# Patient Record
Sex: Male | Born: 1955 | Race: White | Hispanic: No | Marital: Single | Smoking: Never smoker
Health system: Southern US, Community
[De-identification: ages and names within clinical notes are randomized; demographics above are authoritative.]

## PROBLEM LIST (undated history)

## (undated) DIAGNOSIS — M1990 Unspecified osteoarthritis, unspecified site: Secondary | ICD-10-CM

## (undated) DIAGNOSIS — J45909 Unspecified asthma, uncomplicated: Secondary | ICD-10-CM

## (undated) DIAGNOSIS — R42 Dizziness and giddiness: Secondary | ICD-10-CM

## (undated) HISTORY — PX: SUBTOTAL COLECTOMY: SHX855

## (undated) HISTORY — PX: CATARACT EXTRACTION: SUR2

---

## 2018-04-10 ENCOUNTER — Emergency Department (HOSPITAL_COMMUNITY): Payer: Self-pay

## 2018-04-10 ENCOUNTER — Encounter (HOSPITAL_COMMUNITY): Payer: Self-pay | Admitting: Emergency Medicine

## 2018-04-10 ENCOUNTER — Other Ambulatory Visit: Payer: Self-pay

## 2018-04-10 ENCOUNTER — Emergency Department (HOSPITAL_COMMUNITY)
Admission: EM | Admit: 2018-04-10 | Discharge: 2018-04-10 | Disposition: A | Discharge status: Home / Self Care | Payer: Self-pay | Attending: Emergency Medicine | Admitting: Emergency Medicine

## 2018-04-10 DIAGNOSIS — J45909 Unspecified asthma, uncomplicated: Secondary | ICD-10-CM | POA: Insufficient documentation

## 2018-04-10 DIAGNOSIS — I10 Essential (primary) hypertension: Secondary | ICD-10-CM | POA: Insufficient documentation

## 2018-04-10 DIAGNOSIS — E876 Hypokalemia: Secondary | ICD-10-CM | POA: Insufficient documentation

## 2018-04-10 HISTORY — DX: Unspecified asthma, uncomplicated: J45.909

## 2018-04-10 HISTORY — DX: Dizziness and giddiness: R42

## 2018-04-10 HISTORY — DX: Unspecified osteoarthritis, unspecified site: M19.90

## 2018-04-10 LAB — BASIC METABOLIC PANEL
Anion gap: 9 (ref 5–15)
BUN: 12 mg/dL (ref 8–23)
CO2: 26 mmol/L (ref 22–32)
Calcium: 9.5 mg/dL (ref 8.9–10.3)
Chloride: 103 mmol/L (ref 98–111)
Creatinine, Ser: 1.14 mg/dL (ref 0.61–1.24)
GFR calc Af Amer: >60 mL/min (ref >60)
GFR calc non Af Amer: >60 mL/min (ref >60)
Glucose, Bld: 107 mg/dL — ABNORMAL HIGH (ref 70–99)
Potassium: 3.3 mmol/L — ABNORMAL LOW (ref 3.5–5.1)
SODIUM: 138 mmol/L (ref 135–145)

## 2018-04-10 LAB — CBC
HCT: 41.7 % (ref 39.0–52.0)
Hemoglobin: 13.6 g/dL (ref 13.0–17.0)
MCH: 33.8 pg (ref 26.0–34.0)
MCHC: 32.6 g/dL (ref 30.0–36.0)
MCV: 103.7 fL — ABNORMAL HIGH (ref 80.0–100.0)
NRBC: 0 % (ref 0.0–0.2)
Platelets: 274 10*3/uL (ref 150–400)
RBC: 4.02 MIL/uL — ABNORMAL LOW (ref 4.22–5.81)
RDW: 12.4 % (ref 11.5–15.5)
WBC: 5.2 10*3/uL (ref 4.0–10.5)

## 2018-04-10 MED ORDER — POTASSIUM CHLORIDE CRYS ER 20 MEQ PO TBCR
40.0000 meq | EXTENDED_RELEASE_TABLET | Freq: Once | ORAL | Status: AC
  Administered 2018-04-10: 40 meq via ORAL
  Filled 2018-04-10: qty 2

## 2018-04-10 MED ORDER — HYDROCHLOROTHIAZIDE 25 MG PO TABS: 25.0000 mg | ORAL_TABLET | Freq: Every day | ORAL | 1 refills | Status: DC

## 2018-04-10 NOTE — Discharge Instructions (Signed)
Start the blood pressure medicine hydrochlorothiazide take it daily.  Follow-up with your new primary care doctor for recheck of the blood pressure and for recheck of your potassium.  Given information about foods that are high in potassium bananas are 1 of them would recommend 2 bananas a day.

## 2018-04-10 NOTE — ED Provider Notes (Signed)
[facility] EMERGENCY DEPARTMENT Provider Note   CSN: [id number] Arrival date & time: 04/10/18  1047     History   Chief Complaint Chief Complaint  Patient presents with  . Hypertension    HPI Todd Richardson is a 62 y.o. male.  Patient sent from his new primary care clinic.  That he went to see for knee pain he was sent over for high blood pressure.  Blood pressure in the office was 210/101.  Patient is not on any blood pressure meds.  States that he was not aware that he had hypertension.  Patient also without any symptoms.  No shortness of breath no chest pain no headache no strokelike symptoms.  States that he has been peeing normally.     Past Medical History:  Diagnosis Date  . Arthritis   . Asthma   . Vertigo     There are no active problems to display for this patient.   History reviewed. No pertinent surgical history.      Home Medications    Prior to Admission medications   Medication Sig Start Date End Date Taking? Authorizing Provider  hydrochlorothiazide (HYDRODIURIL) 25 MG tablet Take 1 tablet (25 mg total) by mouth daily. 04/10/18   Fredia Sorrow, MD    Family History History reviewed. No pertinent family history.  Social History Social History   Tobacco Use  . Smoking status: Never Smoker  . Smokeless tobacco: Never Used  Substance Use Topics  . Alcohol use: Yes    Alcohol/week: 4.0 standard drinks    Types: 4 Cans of beer per week    Comment: every other day   . Drug use: Never     Allergies   Apple   Review of Systems Review of Systems  Constitutional: Negative for fever.  HENT: Negative for congestion and trouble swallowing.   Eyes: Negative for visual disturbance.  Respiratory: Negative for shortness of breath.   Cardiovascular: Negative for chest pain.  Gastrointestinal: Negative for abdominal pain and nausea.  Musculoskeletal: Positive for joint swelling.  Skin: Negative for rash.  Neurological: Negative for syncope,  weakness, numbness and headaches.  Hematological: Does not bruise/bleed easily.  Psychiatric/Behavioral: Negative for confusion.     Physical Exam Updated Vital Signs BP (!) 161/140   Pulse 87   Temp 98.6 F (37 C) (Oral)   Resp 16   Ht 1.702 m (5\' 7" )   Wt 92.5 kg   SpO2 (!) 87%   BMI 31.95 kg/m   Physical Exam Vitals signs and nursing note reviewed.  Constitutional:      General: He is not in acute distress.    Appearance: Normal appearance.  HENT:     Head: Normocephalic and atraumatic.     Mouth/Throat:     Mouth: Mucous membranes are moist.  Eyes:     Extraocular Movements: Extraocular movements intact.     Conjunctiva/sclera: Conjunctivae normal.     Pupils: Pupils are equal, round, and reactive to light.  Neck:     Musculoskeletal: Normal range of motion and neck supple.  Cardiovascular:     Rate and Rhythm: Normal rate and regular rhythm.     Heart sounds: No murmur.  Pulmonary:     Effort: Pulmonary effort is normal. No respiratory distress.     Breath sounds: Normal breath sounds.  Abdominal:     General: Bowel sounds are normal.     Palpations: Abdomen is soft.     Tenderness: There is no abdominal tenderness.  Musculoskeletal: Normal range of motion.        General: No swelling or deformity.     Comments: Lateral knees without any significant abnormalities.  Skin:    General: Skin is warm.     Capillary Refill: Capillary refill takes less than 2 seconds.  Neurological:     General: No focal deficit present.     Mental Status: He is alert and oriented to person, place, and time.      ED Treatments / Results  Labs (all labs ordered are listed, but only abnormal results are displayed) Labs Reviewed  CBC - Abnormal; Notable for the following components:      Result Value   RBC 4.02 (*)    MCV 103.7 (*)    All other components within normal limits  BASIC METABOLIC PANEL - Abnormal; Notable for the following components:   Potassium 3.3 (*)     Glucose, Bld 107 (*)    All other components within normal limits    EKG EKG Interpretation  Date/Time:  Friday April 10 2018 11:05:04 EST Ventricular Rate:  93 PR Interval:    QRS Duration: 95 QT Interval:  393 QTC Calculation: 489 R Axis:   11 Text Interpretation:  Sinus rhythm Left ventricular hypertrophy Nonspecific T abnrm, anterolateral leads Borderline prolonged QT interval Baseline wander in lead(s) V2 No previous ECGs available Artifact Confirmed by Fredia Sorrow [phone]) on 04/10/2018 11:14:53 AM   Radiology Dg Chest 2 View  Result Date: 04/10/2018 CLINICAL DATA:  Hypertension EXAM: CHEST - 2 VIEW COMPARISON:  None. FINDINGS: The heart size and mediastinal contours are within normal limits. Both lungs are clear. The visualized skeletal structures are unremarkable. IMPRESSION: No active cardiopulmonary disease. Electronically Signed   By: Franchot Gallo M.D.   On: 04/10/2018 13:04    Procedures Procedures (including critical care time)  Medications Ordered in ED Medications  potassium chloride SA (K-DUR,KLOR-CON) CR tablet 40 mEq (40 mEq Oral Given 04/10/18 1323)     Initial Impression / Assessment and Plan / ED Course  I have reviewed the triage vital signs and the nursing notes.  Pertinent labs & imaging results that were available during my care of the patient were reviewed by me and considered in my medical decision making (see chart for details).    Patient referred for evaluation of hypertension.  This would be new onset as far as treatment goes.  Patient had chest x-ray basic labs.  No significant abnormalities other than potassium was slightly low at 3.3.  Patient given 40 mEq of potassium orally here.  Given diet information for foods high in potassium.  Will start him on hydrochlorothiazide since he is over age 59.  Patient will follow back up with his new primary care provider for follow-ups of the blood pressure and for following his potassium.   Patient nontoxic no acute distress.   Final Clinical Impressions(s) / ED Diagnoses   Final diagnoses:  Essential hypertension  Hypokalemia    ED Discharge Orders         Ordered    hydrochlorothiazide (HYDRODIURIL) 25 MG tablet  Daily     04/10/18 1321           Fredia Sorrow, MD 04/10/18 1628

## 2018-04-10 NOTE — ED Notes (Signed)
edp aware of bp  

## 2018-04-10 NOTE — ED Triage Notes (Signed)
Pt was seen at [facility] for knee pain but sent over to ED for high BP.  BP in office was 210/101.  Denies being on BP meds at this moment

## 2018-04-10 NOTE — ED Notes (Signed)
NP at [facility] clinic says he thought he heard a r carotid bruit on assessment.

## 2018-07-27 ENCOUNTER — Emergency Department (HOSPITAL_COMMUNITY)
Admission: EM | Admit: 2018-07-27 | Discharge: 2018-07-27 | Disposition: A | Discharge status: Home / Self Care | Payer: Self-pay | Attending: Emergency Medicine | Admitting: Emergency Medicine

## 2018-07-27 ENCOUNTER — Encounter (HOSPITAL_COMMUNITY): Payer: Self-pay | Admitting: Emergency Medicine

## 2018-07-27 ENCOUNTER — Other Ambulatory Visit: Payer: Self-pay

## 2018-07-27 DIAGNOSIS — Z79899 Other long term (current) drug therapy: Secondary | ICD-10-CM | POA: Insufficient documentation

## 2018-07-27 DIAGNOSIS — H3561 Retinal hemorrhage, right eye: Secondary | ICD-10-CM | POA: Insufficient documentation

## 2018-07-27 DIAGNOSIS — J45909 Unspecified asthma, uncomplicated: Secondary | ICD-10-CM | POA: Insufficient documentation

## 2018-07-27 NOTE — ED Provider Notes (Signed)
[facility] EMERGENCY DEPARTMENT Provider Note   CSN: [id number] Arrival date & time: 07/27/18  1102    History   Chief Complaint Chief Complaint  Patient presents with  . Eye Problem    HPI Todd Richardson is a 63 y.o. male.     HPI   He reports onset of spots in his right eye visual field, this morning.  He also feels like there is a dark ribbonlike spot moving across his visual field.  Has a history of similar problems with his left eye, and has very blurry vision, for 3 months in the left eye.  He is being treated for high blood pressure.  This morning at home it was 130/95.  He is taking HCTZ and lisinopril.  He has not seen a ophthalmologist regarding vision loss left eye, yet.  Past Medical History:  Diagnosis Date  . Arthritis   . Asthma   . Vertigo     There are no active problems to display for this patient.   History reviewed. No pertinent surgical history.      Home Medications    Prior to Admission medications   Medication Sig Start Date End Date Taking? Authorizing Provider  hydrochlorothiazide (HYDRODIURIL) 25 MG tablet Take 1 tablet (25 mg total) by mouth daily. 04/10/18   Fredia Sorrow, MD    Family History Family History  Problem Relation Age of Onset  . Hypertension Mother   . Diabetes Mother   . Parkinson's disease Father   . Dementia Father     Social History Social History   Tobacco Use  . Smoking status: Never Smoker  . Smokeless tobacco: Never Used  Substance Use Topics  . Alcohol use: Yes    Alcohol/week: 4.0 standard drinks    Types: 4 Cans of beer per week    Comment: every day  . Drug use: Never     Allergies   Apple   Review of Systems Review of Systems  All other systems reviewed and are negative.    Physical Exam Updated Vital Signs BP (!) 167/102 (BP Location: Left Arm)   Pulse (!) 102   Temp 98.3 F (36.8 C) (Oral)   Resp 16   Ht 5\' 7"  (1.702 m)   Wt 88.5 kg   SpO2 100%   BMI 30.54 kg/m    Physical Exam Vitals signs and nursing note reviewed.  Constitutional:      Appearance: Normal appearance. He is well-developed.  HENT:     Head: Normocephalic and atraumatic.     Right Ear: External ear normal.     Left Ear: External ear normal.  Eyes:     General:        Right eye: No discharge.        Left eye: No discharge.     Conjunctiva/sclera: Conjunctivae normal.     Pupils: Pupils are equal, round, and reactive to light.     Comments: Fundndoscopy: Right eye, apparent recent retinal hemorrhage, 2 to 3 o'clock position.  Left eye-retinal distortion, medial hemisphere, appears chronic.  Neck:     Musculoskeletal: Normal range of motion and neck supple.     Trachea: Phonation normal.  Cardiovascular:     Rate and Rhythm: Normal rate and regular rhythm.  Pulmonary:     Effort: Pulmonary effort is normal.     Breath sounds: Normal breath sounds.  Abdominal:     Palpations: Abdomen is soft.  Musculoskeletal: Normal range of motion.  Skin:  General: Skin is warm and dry.  Neurological:     Mental Status: He is alert and oriented to person, place, and time.     Cranial Nerves: No cranial nerve deficit.     Sensory: No sensory deficit.     Motor: No abnormal muscle tone.     Coordination: Coordination normal.  Psychiatric:        Mood and Affect: Mood normal.        Behavior: Behavior normal.        Thought Content: Thought content normal.        Judgment: Judgment normal.      ED Treatments / Results  Labs (all labs ordered are listed, but only abnormal results are displayed) Labs Reviewed - No data to display  EKG None  Radiology No results found.  Procedures Procedures (including critical care time)  Medications Ordered in ED Medications - No data to display   Initial Impression / Assessment and Plan / ED Course  I have reviewed the triage vital signs and the nursing notes.  Pertinent labs & imaging results that were available during my care of  the patient were reviewed by me and considered in my medical decision making (see chart for details).  Clinical Course as of Jul 27 1343  Mon Jul 27, 2018  1333 I discussed the case with ophthalmologist, Dr. Nancy Fetter.  She will see the patient tomorrow morning at 10:30 AM in her office in [facility].  Patient is agreeable to this plan.   [EW]    Clinical Course User Index [EW] Daleen Bo, MD        Patient Vitals for the past 24 hrs:  BP Temp Temp src Pulse Resp SpO2 Height Weight  07/27/18 1116 (!) 167/102 98.3 F (36.8 C) Oral (!) 102 16 100 % - -  07/27/18 1112 - - - - - - 5\' 7"  (1.702 m) 88.5 kg    1:34 PM Reevaluation with update and discussion. After initial assessment and treatment, an updated evaluation reveals findings discussed and questions answered. Daleen Bo   Medical Decision Making: Evaluation consistent with retinal hemorrhage, right eye.  Specht older retinal hemorrhage, left eye.  Patient with mild vision loss, right eye, severe vision loss left eye.  Left eye vision loss is subacute.  Right eye vision loss is likely from today.  Patient has treated hypertension, mildly elevated this time.  Patient stable for discharge with outpatient management.  CRITICAL CARE-no Performed by: Daleen Bo  Nursing Notes Reviewed/ Care Coordinated Applicable Imaging Reviewed Interpretation of Laboratory Data incorporated into ED treatment  The patient appears reasonably screened and/or stabilized for discharge and I doubt any other medical condition or other [facility] requiring further screening, evaluation, or treatment in the ED at this time prior to discharge.  Plan: Home Medications-continue usual; Home Treatments-low-salt diet, avoid stress; return here if the recommended treatment, does not improve the symptoms; Recommended follow up-hemology at 10:30 AM tomorrow morning.   Final Clinical Impressions(s) / ED Diagnoses   Final diagnoses:  Retinal  hemorrhage, right    ED Discharge Orders    None       Daleen Bo, MD 07/27/18 1345

## 2018-07-27 NOTE — ED Triage Notes (Signed)
Patient report sudden onset of dark spots and streaks in his eyes around 0800 today. Patient states he has had blurred vision in his L eye for some time but now has problems with both eyes. No known injury. Patient denies pain.

## 2018-07-27 NOTE — Discharge Instructions (Addendum)
Follow-up with Dr. Nancy Fetter tomorrow, in her office, in [place], tomorrow morning at 10:30 AM.  It is very important to go to that office, as scheduled, to prevent further vision loss.  In the meantime rest and avoid exertion or stress.  Continue to take your blood pressure medicine.  Also to help your blood pressure stay on a low-salt diet.  Do not take aspirin, or ibuprofen.  These can worsen bleeding.

## 2021-05-14 ENCOUNTER — Encounter: Payer: Self-pay | Admitting: Internal Medicine

## 2021-07-04 ENCOUNTER — Ambulatory Visit (INDEPENDENT_AMBULATORY_CARE_PROVIDER_SITE_OTHER): Payer: Self-pay | Admitting: *Deleted

## 2021-07-04 ENCOUNTER — Other Ambulatory Visit: Payer: Self-pay

## 2021-07-04 VITALS — Ht 68.0 in | Wt 188.0 lb

## 2021-07-04 DIAGNOSIS — Z1211 Encounter for screening for malignant neoplasm of colon: Secondary | ICD-10-CM

## 2021-07-04 NOTE — Progress Notes (Addendum)
Gastroenterology Pre-Procedure Review  Request Date: 07/04/2021 Requesting Physician: Dr. Dahlia Client, no previous TCS  PATIENT REVIEW QUESTIONS: The patient responded to the following health history questions as indicated:    1. Diabetes Melitis: no 2. Joint replacements in the past 12 months: no 3. Major health problems in the past 3 months: yes, sick in Dec 2022 but negative for Covid, lost taste 4. Has an artificial valve or MVP: no 5. Has a defibrillator: no 6. Has been advised in past to take antibiotics in advance of a procedure like teeth cleaning: no 7. Family history of colon cancer: no 8. Alcohol Use: no 9. Illicit drug Use: no 10. History of sleep apnea: no 11. History of coronary artery or other vascular stents placed within the last 12 months: no 12. History of any prior anesthesia complications: no 13. Body mass index is 28.59 kg/m.    MEDICATIONS & ALLERGIES:    Patient reports the following regarding taking any blood thinners:   Plavix? no Aspirin? Yes, 325 mg as needed Coumadin? no Brilinta? no Xarelto? no Eliquis? no Pradaxa? no Savaysa? no Effient? no  Patient confirms/reports the following medications:  Current Outpatient Medications  Medication Sig Dispense Refill   aspirin 325 MG tablet Take 325 mg by mouth as needed.     cetirizine (ZYRTEC) 10 MG tablet Take 10 mg by mouth as needed for allergies.     lisinopril-hydrochlorothiazide (ZESTORETIC) 10-12.5 MG tablet Take 1 tablet by mouth daily.     No current facility-administered medications for this visit.    Patient confirms/reports the following allergies:  Allergies  Allergen Reactions   Apple Juice Anaphylaxis    Throat swelling     No orders of the defined types were placed in this encounter.   AUTHORIZATION INFORMATION Primary Insurance:[employer] Medicare,  ID #: [id number] ,  Group #: [id number] Pre-Cert / Auth required: Yes, approved online 11/04/2954-05/30/3084 Pre-Cert / Berkley Harvey #:  [id number]  SCHEDULE INFORMATION: Procedure has been scheduled as follows:  Date: 07/31/2021, Time: 11:00 Location: [facility] with Dr. Marletta Lor  This Gastroenterology Pre-Precedure Review Form is being routed to the following provider(s): Tana Coast, PA-C

## 2021-07-05 NOTE — Progress Notes (Signed)
Ok to schedule. ? ?MAKE SURE WE DO NOT TELL HIM TO DRINK APPLE JUICE WITH CLEARS. HE HAS SEVERE ALLERGY TO IT. ? ?Will need labs due to diuretic. ? ?ASA 2, propofol. ?

## 2021-07-06 NOTE — Progress Notes (Addendum)
Tried to call pt.  Voice mail full. ?

## 2021-07-09 NOTE — Progress Notes (Signed)
Spoke to pt.  He requested me to call him once April schedules are available. ?

## 2021-07-13 ENCOUNTER — Encounter: Payer: Self-pay | Admitting: *Deleted

## 2021-07-13 ENCOUNTER — Other Ambulatory Visit: Payer: Self-pay | Admitting: *Deleted

## 2021-07-13 DIAGNOSIS — Z1211 Encounter for screening for malignant neoplasm of colon: Secondary | ICD-10-CM

## 2021-07-13 MED ORDER — PEG 3350-KCL-NA BICARB-NACL 420 G PO SOLR: 4000.0000 mL | Freq: Once | ORAL | 0 refills | Status: AC

## 2021-07-13 NOTE — Addendum Note (Signed)
Addended by: Metro Kung on: 07/13/2021 10:41 AM ? ? Modules accepted: Orders ? ?

## 2021-07-13 NOTE — Progress Notes (Signed)
Spoke to pt.  Scheduled procedure for 07/31/2021 at 11:00, arrival at 9:30 at [facility].  Reviewed prep instructions with pt by phone.  Pt informed to have labs drawn on 07/27/2021 at [facility].  Pt advised not to drink any apple juice while on clear liquids.  Pt informed that I am mailing him prep instructions.  Confirmed mailing address.  Pt aware to pick up prep kit and required OTC items.  ?

## 2021-07-13 NOTE — Progress Notes (Signed)
Spoke to pt.  Advised pt not to take ASA 325 mg 5 days prior to procedure.  He informed me that he had already stopped taking it. ?

## 2021-07-27 ENCOUNTER — Other Ambulatory Visit (HOSPITAL_COMMUNITY)
Admission: RE | Admit: 2021-07-27 | Discharge: 2021-07-27 | Disposition: A | Discharge status: Home / Self Care | Payer: Self-pay | Source: Ambulatory Visit | Attending: Internal Medicine | Admitting: Internal Medicine

## 2021-07-27 DIAGNOSIS — Z1211 Encounter for screening for malignant neoplasm of colon: Secondary | ICD-10-CM | POA: Insufficient documentation

## 2021-07-27 LAB — BASIC METABOLIC PANEL
Anion gap: 7 (ref 5–15)
BUN: 12 mg/dL (ref 8–23)
CO2: 25 mmol/L (ref 22–32)
Calcium: 8 mg/dL — ABNORMAL LOW (ref 8.9–10.3)
Chloride: 102 mmol/L (ref 98–111)
Creatinine, Ser: 0.88 mg/dL (ref 0.61–1.24)
GFR, Estimated: >60 mL/min (ref >60)
Glucose, Bld: 116 mg/dL — ABNORMAL HIGH (ref 70–99)
Potassium: 3 mmol/L — ABNORMAL LOW (ref 3.5–5.1)
Sodium: 134 mmol/L — ABNORMAL LOW (ref 135–145)

## 2021-07-30 ENCOUNTER — Telehealth: Payer: Self-pay | Admitting: Internal Medicine

## 2021-07-30 ENCOUNTER — Other Ambulatory Visit: Payer: Self-pay | Admitting: *Deleted

## 2021-07-30 DIAGNOSIS — D500 Iron deficiency anemia secondary to blood loss (chronic): Secondary | ICD-10-CM | POA: Insufficient documentation

## 2021-07-30 MED ORDER — POTASSIUM CHLORIDE 20 MEQ/15ML (10%) PO SOLN: 40.0000 meq | Freq: Three times a day (TID) | ORAL | 0 refills | Status: DC

## 2021-07-30 NOTE — Telephone Encounter (Signed)
Potassium low at 3.0.  We will send in potassium chloride 40 meq 3 times daily for today in liquid form as requested.  Please let patient know.  Thank you ?

## 2021-07-30 NOTE — Progress Notes (Signed)
Patient's potassium 3.0. Dr. Abbey Chatters notified. Will call in prescription for liquid potassium due to patient having trouble swallowing large pills. Patient notified of the above and verbalized understanding.  ?

## 2021-07-31 ENCOUNTER — Encounter (HOSPITAL_COMMUNITY): Payer: Self-pay

## 2021-07-31 ENCOUNTER — Ambulatory Visit (HOSPITAL_COMMUNITY): Payer: Medicare Other

## 2021-07-31 ENCOUNTER — Other Ambulatory Visit: Payer: Self-pay

## 2021-07-31 ENCOUNTER — Ambulatory Visit (HOSPITAL_COMMUNITY): Payer: Medicare Other | Admitting: Anesthesiology

## 2021-07-31 ENCOUNTER — Inpatient Hospital Stay (HOSPITAL_COMMUNITY)
Admission: AD | Admit: 2021-07-31 | Discharge: 2021-08-09 | DRG: 330 | Disposition: A | Discharge status: Home Health | Payer: Medicare Other | Attending: Family Medicine | Admitting: Family Medicine

## 2021-07-31 ENCOUNTER — Inpatient Hospital Stay (HOSPITAL_COMMUNITY): Payer: Medicare Other

## 2021-07-31 ENCOUNTER — Encounter (HOSPITAL_COMMUNITY)
Admission: AD | Disposition: A | Discharge status: Home Health | Payer: Self-pay | Source: Home / Self Care | Attending: Family Medicine

## 2021-07-31 DIAGNOSIS — R110 Nausea: Secondary | ICD-10-CM

## 2021-07-31 DIAGNOSIS — D630 Anemia in neoplastic disease: Secondary | ICD-10-CM | POA: Diagnosis present

## 2021-07-31 DIAGNOSIS — C187 Malignant neoplasm of sigmoid colon: Secondary | ICD-10-CM | POA: Diagnosis present

## 2021-07-31 DIAGNOSIS — I4891 Unspecified atrial fibrillation: Secondary | ICD-10-CM | POA: Diagnosis not present

## 2021-07-31 DIAGNOSIS — Z8249 Family history of ischemic heart disease and other diseases of the circulatory system: Secondary | ICD-10-CM | POA: Diagnosis not present

## 2021-07-31 DIAGNOSIS — K648 Other hemorrhoids: Secondary | ICD-10-CM

## 2021-07-31 DIAGNOSIS — Z7982 Long term (current) use of aspirin: Secondary | ICD-10-CM

## 2021-07-31 DIAGNOSIS — M1990 Unspecified osteoarthritis, unspecified site: Secondary | ICD-10-CM | POA: Diagnosis present

## 2021-07-31 DIAGNOSIS — J302 Other seasonal allergic rhinitis: Secondary | ICD-10-CM | POA: Diagnosis present

## 2021-07-31 DIAGNOSIS — R739 Hyperglycemia, unspecified: Secondary | ICD-10-CM | POA: Diagnosis present

## 2021-07-31 DIAGNOSIS — C19 Malignant neoplasm of rectosigmoid junction: Secondary | ICD-10-CM | POA: Insufficient documentation

## 2021-07-31 DIAGNOSIS — D72829 Elevated white blood cell count, unspecified: Secondary | ICD-10-CM | POA: Diagnosis present

## 2021-07-31 DIAGNOSIS — R634 Abnormal weight loss: Secondary | ICD-10-CM | POA: Diagnosis present

## 2021-07-31 DIAGNOSIS — R109 Unspecified abdominal pain: Secondary | ICD-10-CM | POA: Insufficient documentation

## 2021-07-31 DIAGNOSIS — K56691 Other complete intestinal obstruction: Secondary | ICD-10-CM | POA: Diagnosis present

## 2021-07-31 DIAGNOSIS — Z28310 Unvaccinated for covid-19: Secondary | ICD-10-CM | POA: Diagnosis not present

## 2021-07-31 DIAGNOSIS — K56609 Unspecified intestinal obstruction, unspecified as to partial versus complete obstruction: Secondary | ICD-10-CM | POA: Diagnosis not present

## 2021-07-31 DIAGNOSIS — R339 Retention of urine, unspecified: Secondary | ICD-10-CM | POA: Diagnosis not present

## 2021-07-31 DIAGNOSIS — D75839 Thrombocytosis, unspecified: Secondary | ICD-10-CM | POA: Diagnosis present

## 2021-07-31 DIAGNOSIS — Z1211 Encounter for screening for malignant neoplasm of colon: Secondary | ICD-10-CM | POA: Diagnosis not present

## 2021-07-31 DIAGNOSIS — Z91018 Allergy to other foods: Secondary | ICD-10-CM

## 2021-07-31 DIAGNOSIS — I10 Essential (primary) hypertension: Secondary | ICD-10-CM | POA: Diagnosis present

## 2021-07-31 DIAGNOSIS — Z800 Family history of malignant neoplasm of digestive organs: Secondary | ICD-10-CM

## 2021-07-31 DIAGNOSIS — C180 Malignant neoplasm of cecum: Secondary | ICD-10-CM | POA: Diagnosis present

## 2021-07-31 DIAGNOSIS — C189 Malignant neoplasm of colon, unspecified: Secondary | ICD-10-CM | POA: Diagnosis not present

## 2021-07-31 DIAGNOSIS — Z79899 Other long term (current) drug therapy: Secondary | ICD-10-CM | POA: Diagnosis not present

## 2021-07-31 DIAGNOSIS — E860 Dehydration: Secondary | ICD-10-CM | POA: Diagnosis present

## 2021-07-31 DIAGNOSIS — R9431 Abnormal electrocardiogram [ECG] [EKG]: Secondary | ICD-10-CM | POA: Diagnosis not present

## 2021-07-31 DIAGNOSIS — K224 Dyskinesia of esophagus: Secondary | ICD-10-CM | POA: Diagnosis not present

## 2021-07-31 DIAGNOSIS — I4821 Permanent atrial fibrillation: Principal | ICD-10-CM

## 2021-07-31 DIAGNOSIS — E871 Hypo-osmolality and hyponatremia: Secondary | ICD-10-CM | POA: Diagnosis present

## 2021-07-31 DIAGNOSIS — R338 Other retention of urine: Secondary | ICD-10-CM

## 2021-07-31 DIAGNOSIS — E876 Hypokalemia: Secondary | ICD-10-CM | POA: Diagnosis present

## 2021-07-31 HISTORY — PX: FLEXIBLE SIGMOIDOSCOPY: SHX5431

## 2021-07-31 HISTORY — PX: SUBMUCOSAL TATTOO INJECTION: SHX6856

## 2021-07-31 HISTORY — PX: BIOPSY: SHX5522

## 2021-07-31 LAB — COMPREHENSIVE METABOLIC PANEL
ALT: 15 U/L (ref 0–44)
AST: 20 U/L (ref 15–41)
Albumin: 2.3 g/dL — ABNORMAL LOW (ref 3.5–5.0)
Alkaline Phosphatase: 83 U/L (ref 38–126)
Anion gap: 6 (ref 5–15)
BUN: 13 mg/dL (ref 8–23)
CO2: 23 mmol/L (ref 22–32)
Calcium: 8.2 mg/dL — ABNORMAL LOW (ref 8.9–10.3)
Chloride: 104 mmol/L (ref 98–111)
Creatinine, Ser: 0.73 mg/dL (ref 0.61–1.24)
GFR, Estimated: >60 mL/min (ref >60)
Glucose, Bld: 151 mg/dL — ABNORMAL HIGH (ref 70–99)
Potassium: 3.1 mmol/L — ABNORMAL LOW (ref 3.5–5.1)
Sodium: 133 mmol/L — ABNORMAL LOW (ref 135–145)
Total Bilirubin: 0.5 mg/dL (ref 0.3–1.2)
Total Protein: 5.5 g/dL — ABNORMAL LOW (ref 6.5–8.1)

## 2021-07-31 LAB — CBC
HCT: 30.4 % — ABNORMAL LOW (ref 39.0–52.0)
Hemoglobin: 9.8 g/dL — ABNORMAL LOW (ref 13.0–17.0)
MCH: 29.6 pg (ref 26.0–34.0)
MCHC: 32.2 g/dL (ref 30.0–36.0)
MCV: 91.8 fL (ref 80.0–100.0)
Platelets: 799 10*3/uL — ABNORMAL HIGH (ref 150–400)
RBC: 3.31 MIL/uL — ABNORMAL LOW (ref 4.22–5.81)
RDW: 16.2 % — ABNORMAL HIGH (ref 11.5–15.5)
WBC: 14.6 10*3/uL — ABNORMAL HIGH (ref 4.0–10.5)
nRBC: 0 % (ref 0.0–0.2)

## 2021-07-31 LAB — MAGNESIUM: Magnesium: 2.3 mg/dL (ref 1.7–2.4)

## 2021-07-31 LAB — HIV ANTIBODY (ROUTINE TESTING W REFLEX): HIV Screen 4th Generation wRfx: NONREACTIVE

## 2021-07-31 LAB — PHOSPHORUS: Phosphorus: 3.8 mg/dL (ref 2.5–4.6)

## 2021-07-31 SURGERY — BIOPSY
Anesthesia: General

## 2021-07-31 MED ORDER — ACETAMINOPHEN 325 MG PO TABS
650.0000 mg | ORAL_TABLET | Freq: Four times a day (QID) | ORAL | Status: DC | PRN
  Filled 2021-07-31: qty 2

## 2021-07-31 MED ORDER — FENTANYL CITRATE PF 50 MCG/ML IJ SOSY
PREFILLED_SYRINGE | INTRAMUSCULAR | Status: AC
  Administered 2021-07-31: 50 ug
  Filled 2021-07-31: qty 1

## 2021-07-31 MED ORDER — ONDANSETRON HCL 4 MG/2ML IJ SOLN
4.0000 mg | Freq: Four times a day (QID) | INTRAMUSCULAR | Status: DC | PRN
Start: 2021-07-31 — End: 2021-08-09
  Administered 2021-08-01 – 2021-08-09 (×4): 4 mg via INTRAVENOUS
  Filled 2021-07-31 (×8): qty 2

## 2021-07-31 MED ORDER — IOHEXOL 300 MG/ML  SOLN
100.0000 mL | Freq: Once | INTRAMUSCULAR | Status: AC | PRN
  Administered 2021-07-31: 100 mL via INTRAVENOUS

## 2021-07-31 MED ORDER — SODIUM CHLORIDE FLUSH 0.9 % IV SOLN
INTRAVENOUS | Status: AC
  Filled 2021-07-31: qty 10

## 2021-07-31 MED ORDER — ONDANSETRON HCL 4 MG/2ML IJ SOLN: 4.0000 mg | Freq: Once | INTRAMUSCULAR | Status: AC

## 2021-07-31 MED ORDER — SODIUM CHLORIDE (PF) 0.9 % IJ SOLN
10.0000 mL | Freq: Once | INTRAMUSCULAR | Status: AC
  Administered 2021-07-31: 10 mL via INTRAVENOUS

## 2021-07-31 MED ORDER — FENTANYL CITRATE (PF) 100 MCG/2ML IJ SOLN
50.0000 ug | INTRAMUSCULAR | Status: DC | PRN
  Administered 2021-07-31: 50 ug via INTRAVENOUS
  Filled 2021-07-31 (×2): qty 2

## 2021-07-31 MED ORDER — ONDANSETRON HCL 4 MG PO TABS
4.0000 mg | ORAL_TABLET | Freq: Four times a day (QID) | ORAL | Status: DC | PRN
  Administered 2021-08-01 – 2021-08-08 (×2): 4 mg via ORAL
  Filled 2021-07-31 (×3): qty 1

## 2021-07-31 MED ORDER — LACTATED RINGERS IV SOLN
INTRAVENOUS | Status: DC
  Administered 2021-07-31: 1000 mL via INTRAVENOUS

## 2021-07-31 MED ORDER — ACETAMINOPHEN 650 MG RE SUPP
650.0000 mg | Freq: Four times a day (QID) | RECTAL | Status: DC | PRN
  Filled 2021-07-31: qty 1

## 2021-07-31 MED ORDER — ONDANSETRON HCL 4 MG/2ML IJ SOLN
INTRAMUSCULAR | Status: AC
  Administered 2021-07-31: 4 mg via INTRAVENOUS
  Filled 2021-07-31: qty 2

## 2021-07-31 MED ORDER — LIDOCAINE HCL (CARDIAC) PF 100 MG/5ML IV SOSY
PREFILLED_SYRINGE | INTRAVENOUS | Status: DC | PRN
  Administered 2021-07-31: 50 mg via INTRAVENOUS

## 2021-07-31 MED ORDER — SODIUM CHLORIDE FLUSH 0.9 % IV SOLN
INTRAVENOUS | Status: AC
  Administered 2021-07-31: 10 mL
  Filled 2021-07-31: qty 10

## 2021-07-31 MED ORDER — FENTANYL CITRATE PF 50 MCG/ML IJ SOSY
PREFILLED_SYRINGE | INTRAMUSCULAR | Status: AC
  Filled 2021-07-31: qty 1

## 2021-07-31 MED ORDER — POLYETHYLENE GLYCOL 3350 17 G PO PACK
17.0000 g | PACK | ORAL | Status: DC
  Administered 2021-07-31 – 2021-08-02 (×10): 17 g via ORAL
  Filled 2021-07-31 (×11): qty 1

## 2021-07-31 MED ORDER — RINGERS IV SOLN: INTRAVENOUS | Status: AC

## 2021-07-31 MED ORDER — FENTANYL CITRATE (PF) 100 MCG/2ML IJ SOLN
50.0000 ug | Freq: Once | INTRAMUSCULAR | Status: AC
  Administered 2021-07-31: 50 ug via INTRAVENOUS

## 2021-07-31 MED ORDER — HYDROMORPHONE HCL 1 MG/ML IJ SOLN
1.0000 mg | INTRAMUSCULAR | Status: DC | PRN
  Administered 2021-07-31 – 2021-08-08 (×27): 1 mg via INTRAVENOUS
  Filled 2021-07-31 (×31): qty 1

## 2021-07-31 MED ORDER — HEPARIN SODIUM (PORCINE) 5000 UNIT/ML IJ SOLN
5000.0000 [IU] | Freq: Three times a day (TID) | INTRAMUSCULAR | Status: DC
  Administered 2021-08-01 – 2021-08-09 (×24): 5000 [IU] via SUBCUTANEOUS
  Filled 2021-07-31 (×23): qty 1

## 2021-07-31 MED ORDER — PROPOFOL 10 MG/ML IV BOLUS
INTRAVENOUS | Status: DC | PRN
Start: 2021-07-31 — End: 2021-07-31
  Administered 2021-07-31: 30 mg via INTRAVENOUS
  Administered 2021-07-31: 100 mg via INTRAVENOUS
  Administered 2021-07-31: 40 mg via INTRAVENOUS
  Administered 2021-07-31: 30 mg via INTRAVENOUS
  Administered 2021-07-31: 50 mg via INTRAVENOUS

## 2021-07-31 MED ORDER — SPOT INK MARKER SYRINGE KIT
PACK | SUBMUCOSAL | Status: DC | PRN
  Administered 2021-07-31: 1.5 mL via SUBMUCOSAL

## 2021-07-31 MED ORDER — SPOT INK MARKER SYRINGE KIT
PACK | SUBMUCOSAL | Status: AC
  Filled 2021-07-31: qty 5

## 2021-07-31 MED ORDER — POTASSIUM CHLORIDE CRYS ER 20 MEQ PO TBCR
40.0000 meq | EXTENDED_RELEASE_TABLET | Freq: Once | ORAL | Status: AC
  Administered 2021-07-31: 40 meq via ORAL
  Filled 2021-07-31 (×2): qty 2

## 2021-07-31 MED ORDER — PROPOFOL 500 MG/50ML IV EMUL
INTRAVENOUS | Status: AC
Start: 2021-07-31 — End: ?
  Filled 2021-07-31: qty 50

## 2021-07-31 MED ORDER — IPRATROPIUM-ALBUTEROL 0.5-2.5 (3) MG/3ML IN SOLN: 3.0000 mL | Freq: Four times a day (QID) | RESPIRATORY_TRACT | Status: DC | PRN

## 2021-07-31 MED ORDER — MAGNESIUM SULFATE IN D5W 1-5 GM/100ML-% IV SOLN
1.0000 g | Freq: Once | INTRAVENOUS | Status: AC
  Administered 2021-07-31: 1 g via INTRAVENOUS
  Filled 2021-07-31 (×2): qty 100

## 2021-07-31 MED ORDER — FENTANYL CITRATE (PF) 100 MCG/2ML IJ SOLN
INTRAMUSCULAR | Status: AC
  Filled 2021-07-31: qty 2

## 2021-07-31 NOTE — Assessment & Plan Note (Signed)
-  Mild and most likely associated with decreased oral intake, bowel prep and hyperglycemia. ?-Will provide fluid resuscitation ?-Follow electrolytes trend. ?

## 2021-07-31 NOTE — Assessment & Plan Note (Signed)
-  In the setting of colon cancer most likely along with dehydration ?-Will provide DVT prophylaxis and fluid resuscitation ?-Follow platelets count trend. ?

## 2021-07-31 NOTE — Progress Notes (Signed)
Call to friend Bridgett Larsson [phone] to advise friend patient still in treatment area. Friend verbalized understanding.  ?

## 2021-07-31 NOTE — Transfer of Care (Signed)
Immediate Anesthesia Transfer of Care Note ? ?Patient: Todd Richardson ? ?Procedure(s) Performed: BIOPSY ?SUBMUCOSAL TATTOO INJECTION ?FLEXIBLE SIGMOIDOSCOPY ? ?Patient Location: Endoscopy Unit ? ?Anesthesia Type:General ? ?Level of Consciousness: drowsy ? ?Airway & Oxygen Therapy: Patient Spontanous Breathing ? ?Post-op Assessment: Report given to RN and Post -op Vital signs reviewed and stable ? ?Post vital signs: Reviewed and stable ? ?Last Vitals:  ?Vitals Value Taken Time  ?BP    ?Temp    ?Pulse    ?Resp    ?SpO2    ? ? ?Last Pain:  ?Vitals:  ? 07/31/21 1028  ?TempSrc:   ?PainSc: 0-No pain  ?   ? ?Patients Stated Pain Goal: 9 (07/31/21 1001) ? ?Complications: No notable events documented. ?

## 2021-07-31 NOTE — Progress Notes (Signed)
Dr. Abbey Chatters in to talk to patient. ?

## 2021-07-31 NOTE — Assessment & Plan Note (Signed)
-   Blood pressure overall stable ?-In the setting of hypokalemia and dehydration we will hold lisinopril/HCTZ ?-Provide IV fluids and follow vital signs ?

## 2021-07-31 NOTE — Assessment & Plan Note (Signed)
-  Most likely a stress demargination and dehydration ?-Will provide fluid resuscitation ?-Currently no frank/specific source of infection identified ?-Holding on antibiotics. ?

## 2021-07-31 NOTE — Op Note (Signed)
[facility] ?Patient Name: Todd Richardson ?Procedure Date: 07/31/2021 10:15 AM ?MRN: [id number] ?Date of Birth: 09/05/1955 ?Attending MD: Elon Alas. Abbey Chatters , DO ?CSN: [id number] ?Age: 66 ?Admit Type: Outpatient ?Procedure:                Colonoscopy ?Indications:              Screening for colorectal malignant neoplasm ?Providers:                Elon Alas. Abbey Chatters, DO, Charlsie Quest. Theda Sers RN, Therapist, sports,  ?                          Aram Candela ?Referring MD:              ?Medicines:                See the Anesthesia note for documentation of the  ?                          administered medications ?Complications:            No immediate complications. ?Estimated Blood Loss:     Estimated blood loss was minimal. ?Procedure:                Pre-Anesthesia Assessment: ?                          - The anesthesia plan was to use monitored  ?                          anesthesia care (MAC). ?                          After obtaining informed consent, the colonoscope  ?                          was passed under direct vision. Throughout the  ?                          procedure, the patient's blood pressure, pulse, and  ?                          oxygen saturations were monitored continuously. The  ?                          PCF-HQ190L ([id number]) scope was introduced through  ?                          the anus with the intention of advancing to the  ?                          cecum. The scope was advanced to the descending  ?                          colon before the procedure was aborted. Medications  ?                          were given. The  colonoscopy was performed without  ?                          difficulty. The patient tolerated the procedure  ?                          well. The quality of the bowel preparation was  ?                          evaluated using the BBPS [facility] Bowel Preparation  ?                          Scale) with scores of: Transverse Colon = 0  ?                          (unprepared, mucosa not seen due  to solid stool  ?                          that cannot be cleared or unseen proximal colon  ?                          segment in a colonoscopy aborted due to inadequate  ?                          bowel prep) and Left Colon = 2 (minor amount of  ?                          residual staining, small fragments of stool and/or  ?                          opaque liquid, but mucosa seen well). The total  ?                          BBPS score equals 2. The quality of the bowel  ?                          preparation was inadequate. ?Scope In: 10:31:20 AM ?Scope Out: 10:48:38 AM ?Total Procedure Duration: 0 hours 17 minutes 18 seconds  ?Findings: ?     The perianal and digital rectal examinations were normal. ?     Non-bleeding internal hemorrhoids were found during endoscopy. ?     A frond-like/villous, fungating, infiltrative and polypoid nearly  ?     completely obstructing mass was found in the sigmoid colon approx 15 cm  ?     from the anal verge. The mass was circumferential. The mass measured  ?     five cm in length. Oozing was present. Biopsies were taken with a cold  ?     forceps for histology. Area distal was tattooed with an injection of  ?     dye. Unable to pass pediatric colonoscope past lesion. Exchanged for  ?     ultra slim colonoscope and was able to advance past mass. Proximal to  ?     mass, bowel prep was very poor Unable to advance further due to poor  ?  visualization. Procedure was then aborted. ?Impression:               - Preparation of the colon was inadequate. ?                          - Non-bleeding internal hemorrhoids. ?                          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 Biopsied. Tattooed. ?Moderate Sedation: ?     Per Anesthesia Care ?Recommendation:           - Patient has a contact number available for  ?                          emergencies. The signs and symptoms of potential  ?                          delayed  complications were discussed with the  ?                          patient. Return to normal activities tomorrow.  ?                          Written discharge instructions were provided to the  ?                          patient. ?                          - Full liquid diet. ?                          - Miralax 1 capful (17 grams) in 8 ounces of water  ?                          PO BID. ?                          - Case discussed with surgery, urgent referral  ?                          placed. ?                          - Will order CT chest, abd, pelvis for staging. ?                          - Will need oncology referral ?Procedure Code(s):        --- Professional --- ?                          [phone], 52, Colonoscopy, flexible; with directed  ?  submucosal injection(s), any substance ?                          45380, 52, Colonoscopy, flexible; with biopsy,  ?                          single or multiple ?Diagnosis Code(s):        --- Professional --- ?                          Z12.11, Encounter for screening for malignant  ?                          neoplasm of colon ?                          K64.8, Other hemorrhoids ?                          C18.7, Malignant neoplasm of sigmoid colon ?                          K56.691, Other complete intestinal obstruction ?CPT copyright 2019 American Medical Association. All rights reserved. ?The codes documented in this report are preliminary and upon coder review may  ?be revised to meet current compliance requirements. ?Elon Alas. Abbey Chatters, DO ?Elon Alas. [place], DO ?07/31/2021 10:58:17 AM ?This report has been signed electronically. ?Number of Addenda: 0 ?

## 2021-07-31 NOTE — Progress Notes (Signed)
Todd Richardson, patient friend notified that patient will be admitted and per patient advised friend he may leave and go home.  ?

## 2021-07-31 NOTE — Anesthesia Preprocedure Evaluation (Addendum)
Anesthesia Evaluation  ?Patient identified by MRN, date of birth, ID band ?Patient awake ? ? ? ?Reviewed: ?Allergy & Precautions, NPO status , Patient's Chart, lab work & pertinent test results ? ?Airway ?Mallampati: II ? ?TM Distance: >3 FB ?Neck ROM: Full ? ? ? Dental ? ?(+) Dental Advisory Given, Missing, Poor Dentition, Loose, Chipped,  ?  ?Pulmonary ?asthma ,  ?  ?Pulmonary exam normal ?breath sounds clear to auscultation ? ? ? ? ? ? Cardiovascular ?Exercise Tolerance: Good ?hypertension, Pt. on medications ?Normal cardiovascular exam ?Rhythm:Regular Rate:Normal ? ? ?  ?Neuro/Psych ?negative neurological ROS ? negative psych ROS  ? GI/Hepatic ?negative GI ROS, Neg liver ROS,   ?Endo/Other  ?negative endocrine ROS ? Renal/GU ?negative Renal ROS  ?negative genitourinary ?  ?Musculoskeletal ? ?(+) Arthritis , Osteoarthritis,   ? Abdominal ?  ?Peds ?negative pediatric ROS ?(+)  Hematology ?negative hematology ROS ?(+)   ?Anesthesia Other Findings ? ? Reproductive/Obstetrics ?negative OB ROS ? ?  ? ? ? ? ? ? ? ? ? ? ? ? ? ?  ?  ? ? ? ? ? ? ? ?Anesthesia Physical ?Anesthesia Plan ? ?ASA: 2 ? ?Anesthesia Plan: General  ? ?Post-op Pain Management: Minimal or no pain anticipated  ? ?Induction: Intravenous ? ?PONV Risk Score and Plan: TIVA and Propofol infusion ? ?Airway Management Planned: Nasal Cannula and Natural Airway ? ?Additional Equipment:  ? ?Intra-op Plan:  ? ?Post-operative Plan:  ? ?Informed Consent: I have reviewed the patients History and Physical, chart, labs and discussed the procedure including the risks, benefits and alternatives for the proposed anesthesia with the patient or authorized representative who has indicated his/her understanding and acceptance.  ? ? ? ?Dental advisory given ? ?Plan Discussed with: CRNA and Surgeon ? ?Anesthesia Plan Comments:   ? ? ? ? ? ?Anesthesia Quick Evaluation ? ?

## 2021-07-31 NOTE — Progress Notes (Signed)
Dr.Battula in room to assess pts pain. Pt rates pain 10 of 10 in abdomen. KUB ordered and completed. See MAR for orders.  ?

## 2021-07-31 NOTE — Progress Notes (Signed)
[facility] ? ?Full note to follow.  ? ?Clear diet only. ?Miralax packet every 4 hours for now. Will see if tolerates.  ?Will see if can prep. If preps will try for colectomy and anastomosis, if not will need colostomy. Patient aware. ? ?Curlene Labrum, MD ?[facility] ?[place], [place] [address] ?[phone] (office) ? ?

## 2021-07-31 NOTE — Assessment & Plan Note (Signed)
-  Due to bowel preparation and decreased oral intake ?-Will check magnesium and phosphorus level ?-Provide electrolytes repletion and follow electrolytes trend ?-Telemetry has been initially ordered; with plan to discontinue after 24 hours if electrolytes remain stable no abnormality seen. ?

## 2021-07-31 NOTE — Progress Notes (Signed)
Received call from Dr. Abbey Chatters who performed outpatient colonoscopy today on this patient revealing malignant, nearly completely obstructing tumor in the sigmoid colon s/p biopsied and tattooed. Patient is being admitted to the hospital service and general surgery has been consulted with plans to inpatient resection.  ? ?I have placed orders for clear liquids, MiraLAX 17 g every 4 hours, and ordered staging scans including CT chest, abdomen, and pelvis with contrast.  ? ?Aliene Altes, PA-C ?[facility] ? ? ? ? ?

## 2021-07-31 NOTE — Addendum Note (Signed)
Addendum  created 07/31/21 1346 by Denese Killings, MD  ? Order list changed, Pharmacy for encounter modified  ?  ?

## 2021-07-31 NOTE — Anesthesia Postprocedure Evaluation (Signed)
Anesthesia Post Note ? ?Patient: Todd Richardson ? ?Procedure(s) Performed: BIOPSY ?SUBMUCOSAL TATTOO INJECTION ?FLEXIBLE SIGMOIDOSCOPY ? ?Patient location during evaluation: Endoscopy ?Anesthesia Type: General ?Level of consciousness: awake and alert ?Pain management: pain level controlled (patient c/o severe abdominal pain after the procedure, sigmoid colon mass, pain tolerable with iv fentanyl 32mg x2 doses, will be admitted under hospitalist's care) ?Vital Signs Assessment: post-procedure vital signs reviewed and stable ?Respiratory status: spontaneous breathing, nonlabored ventilation and respiratory function stable ?Cardiovascular status: blood pressure returned to baseline and stable ?Postop Assessment: no apparent nausea or vomiting ?Anesthetic complications: no ?Comments: patient c/o severe abdominal pain after the procedure, sigmoid colon mass, pain tolerable with iv fentanyl 536m x2 doses, will be admitted under hospitalist's care. ? ? ?No notable events documented. ? ? ?Last Vitals:  ?Vitals:  ? 07/31/21 1241 07/31/21 1251  ?BP: 123/88 120/85  ?Pulse: 90 86  ?Resp: 19 15  ?Temp:    ?SpO2: 99% 99%  ?  ?Last Pain:  ?Vitals:  ? 07/31/21 1220  ?TempSrc:   ?PainSc: 6   ? ? ?  ?  ?  ?  ?  ?  ? ?Avigail Pilling C Vaiden Adames ? ? ? ? ?

## 2021-07-31 NOTE — H&P (Signed)
Primary Care Physician:  [name], MD ?Primary Gastroenterologist:  Dr. Abbey Chatters ? ?Pre-Procedure History & Physical: ?HPI:  Todd Richardson is a 66 y.o. male is here for first ever colonoscopy for colon cancer screening purposes.  Patient denies any family history of colorectal cancer.  No melena or hematochezia.  No abdominal pain or unintentional weight loss.  No change in bowel habits.  Overall feels well from a GI standpoint. ? ?Past Medical History:  ?Diagnosis Date  ? Arthritis   ? Asthma   ? Vertigo   ? ? ?Past Surgical History:  ?Procedure Laterality Date  ? CATARACT EXTRACTION Left   ? ? ?Prior to Admission medications   ?Medication Sig Start Date End Date Taking? Authorizing Provider  ?aspirin 325 MG tablet Take 325 mg by mouth as needed.   Yes [provider]  ?cetirizine (ZYRTEC) 10 MG tablet Take 10 mg by mouth as needed for allergies.   Yes [provider]  ?lisinopril-hydrochlorothiazide (ZESTORETIC) 10-12.5 MG tablet Take 1 tablet by mouth daily. 06/15/21  Yes [provider]  ?potassium chloride 20 MEQ/15ML (10%) SOLN Take 30 mLs (40 mEq total) by mouth 3 (three) times daily for 1 day. 07/30/21 07/31/21 Yes Eloise Harman, DO  ? ? ?Allergies as of 07/13/2021 - Review Complete 07/04/2021  ?Allergen Reaction Noted  ? 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 ? ?Family History  ?Problem Relation Age of Onset  ? Hypertension Mother   ? Diabetes Mother   ? Parkinson's disease Father   ? Dementia Father   ? ? ?Social History  ? ?Socioeconomic History  ? Marital status: Single  ?  Spouse name: Not on file  ? Number of children: Not on file  ? Years of education: Not on file  ? Highest education level: Not on file  ?Occupational History  ? Not on file  ?Tobacco Use  ? Smoking status: Never  ? Smokeless tobacco: Never  ?Vaping Use  ? Vaping Use: Never used  ?Substance and Sexual Activity  ? Alcohol use: Yes  ?  Alcohol/week: 4.0 standard drinks  ?  Types: 4 Cans of beer per week  ?   Comment: every day  ? Drug use: Never  ? Sexual activity: Not on file  ?Other Topics Concern  ? Not on file  ?Social History Narrative  ? Not on file  ? ?Social Determinants of Health  ? ?Financial Resource Strain: Not on file  ?Food Insecurity: Not on file  ?Transportation Needs: Not on file  ?Physical Activity: Not on file  ?Stress: Not on file  ?Social Connections: Not on file  ?Intimate Partner Violence: Not on file  ? ? ?Review of Systems: ?See HPI, otherwise negative ROS ? ?Physical Exam: ?Vital signs in last 24 hours: ?Temp:  [97.8 ?F (36.6 ?C)] 97.8 ?F (36.6 ?C) (04/04 1001) ?Pulse Rate:  [90] 90 (04/04 1001) ?Resp:  [17] 17 (04/04 1001) ?BP: (117)/(83) 117/83 (04/04 1001) ?SpO2:  [100 %] 100 % (04/04 1001) ?Weight:  [72.1 kg] 72.1 kg (04/04 1001) ?  ?General:   Alert,  Well-developed, well-nourished, pleasant and cooperative in NAD ?Head:  Normocephalic and atraumatic. ?Eyes:  Sclera clear, no icterus.   Conjunctiva pink. ?Ears:  Normal auditory acuity. ?Nose:  No deformity, discharge,  or lesions. ?Mouth:  No deformity or lesions, dentition normal. ?Neck:  Supple; no masses or thyromegaly. ?Lungs:  Clear throughout to auscultation.   No wheezes, crackles, or rhonchi. No acute distress. ?Heart:  Regular rate and  rhythm; no murmurs, clicks, rubs,  or gallops. ?Abdomen:  Soft, nontender and nondistended. No masses, hepatosplenomegaly or hernias noted. Normal bowel sounds, without guarding, and without rebound.   ?Msk:  Symmetrical without gross deformities. Normal posture. ?Extremities:  Without clubbing or edema. ?Neurologic:  Alert and  oriented x4;  grossly normal neurologically. ?Skin:  Intact without significant lesions or rashes. ?Cervical Nodes:  No significant cervical adenopathy. ?Psych:  Alert and cooperative. Normal mood and affect. ? ?Impression/Plan: ?Todd Richardson is here for a colonoscopy to be performed for colon cancer screening purposes. ? ?The risks of the procedure including infection,  bleed, or perforation as well as benefits, limitations, alternatives and imponderables have been reviewed with the patient. Questions have been answered. All parties agreeable. ? ?

## 2021-07-31 NOTE — H&P (Signed)
?History and Physical  ? ? ?Patient: Todd Richardson TJQ:[id number] DOB: 1955/05/23 ?DOA: 07/31/2021 ?DOS: the patient was seen and examined on 07/31/2021 ?PCP: [name], MD  ?Patient coming from: Home ? ?Chief Complaint: Abdominal pain ? ?HPI: Todd Richardson is a 66 y.o. male with medical history significant of hypertension, asthma and seasonal allergies; who presented to the hospital from from home with intention to have screening colonoscopy and further evaluation.  Patient has been experiencing weight loss, abdominal pain, diarrhea and decreased appetite prior to admission.  During colonoscopy evaluation and almost completely obstructive mass was found in the sigmoid colon with high concerns for malignancy.  [facility] hospitalist has been contacted to admit patient to further facilitate his care, staging and surgical intervention.  ?Patient reports no fever, no nausea, no vomiting, no chest pain, no shortness of breath, no focal weaknesses, no dysuria.  ?He has been having abdominal pain mid abdomen and lower quadrants intermittently, crampy and present for weeks prior to admission.  Associated decreased appetite and weight loss has been also reported. ? ?Patient is not vaccinated against COVID. ? ?Hemodynamically stable at time of evaluation nonfebrile.  Case discussed with gastroenterologist and also with general surgery. ? ?Review of Systems: As mentioned in the history of present illness. All other systems reviewed and are negative. ?Past Medical History:  ?Diagnosis Date  ? Arthritis   ? Asthma   ? Vertigo   ? ?Past Surgical History:  ?Procedure Laterality Date  ? CATARACT EXTRACTION Left   ? ?Social History:  reports that he has never smoked. He has never used smokeless tobacco. He reports current alcohol use of about 4.0 standard drinks per week. He reports that he does not use drugs. ? ?Allergies  ?Allergen Reactions  ? Apple Juice Anaphylaxis  ?  Throat swelling ?  ? ? ?Family History  ?Problem Relation  Age of Onset  ? Hypertension Mother   ? Diabetes Mother   ? Parkinson's disease Father   ? Dementia Father   ? ? ?Prior to Admission medications   ?Medication Sig Start Date End Date Taking? Authorizing Provider  ?aspirin 325 MG tablet Take 325 mg by mouth as needed.   Yes [provider]  ?cetirizine (ZYRTEC) 10 MG tablet Take 10 mg by mouth as needed for allergies.   Yes [provider]  ?lisinopril-hydrochlorothiazide (ZESTORETIC) 10-12.5 MG tablet Take 1 tablet by mouth daily. 06/15/21  Yes [provider]  ?potassium chloride 20 MEQ/15ML (10%) SOLN Take 30 mLs (40 mEq total) by mouth 3 (three) times daily for 1 day. 07/30/21 07/31/21 Yes Eloise Harman, DO  ? ? ?Physical Exam: ?Vitals:  ? 07/31/21 1321 07/31/21 1326 07/31/21 1451 07/31/21 1532  ?BP: 112/81 (!) 130/116 108/80 103/82  ?Pulse: 90 79 88 75  ?Resp: (!) '28 16 18   '$ ?Temp:    98 ?F (36.7 ?C)  ?TempSrc:    Oral  ?SpO2: 100% 100%  (!) 85%  ?Weight:      ?Height:      ? ?General exam: Alert, awake, oriented x 3; chronically ill in appearance.  No major distress.  Slightly anxious with ongoing diagnosis and concerns. ?Respiratory system: Clear to auscultation. Respiratory effort normal.  No using accessory muscle.  Good saturation on room air. ?Cardiovascular system:RRR. No murmurs, rubs, gallops.  No JVD. ?Gastrointestinal system: Abdomen is nondistended, soft and tender to palpation in his lower abdomen; no guarding.  Positive bowel sounds.   ?Central nervous system: Alert and oriented.  No focal neurological deficits. ?Extremities: No cyanosis, clubbing or edema. ?Skin: No petechiae. ?Psychiatry: Judgement and insight appear normal. Mood & affect appropriate.  ? ?Data Reviewed: ?CBC demonstrating WBCs of 14.6, hemoglobin 9.8 and platelets count 799K ?Magnesium 2.3 ?Phosphorus 3.8 ?Blood glucose 151 ?Comprehensive metabolic panel demonstrating sodium 133, potassium 3.1, BUN 13, bicarb 23, creatinine 0.73 normal LFTs. ?Chest x-ray  without signs of active cardiopulmonary process. ?Portable abdominal x-ray after colonoscopy evaluation demonstrating nonspecific dilatation of the transverse colon, no evidence of pneumoperitoneum, findings suggestive of severe right hip osteoarthritis ? ?Assessment and Plan: ?* Large bowel obstruction ([place]) ?-In the setting of almost near complete obstruction from colon mass ?-High concerns for malignancy ?-Images for staging and further evaluation has been already ordered by GI service ?-CEA will be followed ?-General surgery has been consulted with anticipation for bowel prep and anticipated resection on Thursday 08/02/21. ?-Okay for clear liquid diet, as needed analgesics and if needed antiemetics ?-Provide IV fluids and supportive care. ? ? ?Colon cancer ([place]) ?- Affecting sigmoid colon as per colonoscopy evaluation ?-Biopsy has been taken and results pending. ?-Staging with CT abdomen and pelvis and CT chest has already been ordered by GI service ?-Will check CEA ?-General surgery has been consulted with anticipated surgical resection on 08/02/2021 ? ?Thrombocytosis ?-In the setting of colon cancer most likely along with dehydration ?-Will provide DVT prophylaxis and fluid resuscitation ?-Follow platelets count trend. ? ?Hyponatremia ?-Mild and most likely associated with decreased oral intake, bowel prep and hyperglycemia. ?-Will provide fluid resuscitation ?-Follow electrolytes trend. ? ?Leukocytosis ?-Most likely a stress demargination and dehydration ?-Will provide fluid resuscitation ?-Currently no frank/specific source of infection identified ?-Holding on antibiotics. ? ?Hyperglycemia ?-No prior history of diabetes ?-Check A1c ? ? ?Hypokalemia ?-Due to bowel preparation and decreased oral intake ?-Will check magnesium and phosphorus level ?-Provide electrolytes repletion and follow electrolytes trend ?-Telemetry has been initially ordered; with plan to discontinue after 24 hours if electrolytes remain  stable no abnormality seen. ? ?HTN (hypertension) ?- Blood pressure overall stable ?-In the setting of hypokalemia and dehydration we will hold lisinopril/HCTZ ?-Provide IV fluids and follow vital signs ? ? ? ? ? Advance Care Planning:   Code Status: Full Code  ? ?Consults: Neurology service and general surgery. ? ?Family Communication: No family at bedside. ? ?Severity of Illness: ?The appropriate patient status for this patient is INPATIENT. Inpatient status is judged to be reasonable and necessary in order to provide the required intensity of service to ensure the patient's safety. The patient's presenting symptoms, physical exam findings, and initial radiographic and laboratory data in the context of their chronic comorbidities is felt to place them at high risk for further clinical deterioration. Furthermore, it is not anticipated that the patient will be medically stable for discharge from the hospital within 2 midnights of admission.  ? ?* I certify that at the point of admission it is my clinical judgment that the patient will require inpatient hospital care spanning beyond 2 midnights from the point of admission due to high intensity of service, high risk for further deterioration and high frequency of surveillance required.* ? ?Author: ?Barton Dubois, MD ?07/31/2021 7:09 PM ? ?For on call review [email or web address].  ?

## 2021-07-31 NOTE — Consult Note (Signed)
[facility] Consult ? ?Reason for Consult: Sigmoid cancer, large bowel obstruction ?Referring Physician:  Dr. Abbey Chatters and Dr. Dyann Kief ? ? ? ?HPI: Todd Richardson is a 66 y.o. male with reported diarrhea since February, no appetite. He feels like he knew he had cancer. He had never had a colonoscopy. He denies any bloody Bms or dark stools. He had a colonoscopy today that demonstrated a long sigmoid colon cancer that required peds scope to pass and concern for large bowel obstruction follow it  given a large amount of gas in the colon and poor preparation. ? ?The patient says he drank about 1/2 of the preparation and could not drink any more. He has been having small liquid stools.  ? ?Past Medical History:  ?Diagnosis Date  ? Arthritis   ? Asthma   ? Vertigo   ? ? ?Past Surgical History:  ?Procedure Laterality Date  ? CATARACT EXTRACTION Left   ? ? ?Family History  ?Problem Relation Age of Onset  ? Hypertension Mother   ? Diabetes Mother   ? Parkinson's disease Father   ? Dementia Father   ? ? ?Social History  ? ?Tobacco Use  ? Smoking status: Never  ? Smokeless tobacco: Never  ?Vaping Use  ? Vaping Use: Never used  ?Substance Use Topics  ? Alcohol use: Yes  ?  Alcohol/week: 4.0 standard drinks  ?  Types: 4 Cans of beer per week  ?  Comment: every day  ? Drug use: Never  ? ? ?Medications: I have reviewed the patient's current medications. ?Prior to Admission:  ?Medications Prior to Admission  ?Medication Sig Dispense Refill Last Dose  ? aspirin 325 MG tablet Take 325 mg by mouth as needed.   Past Month  ? cetirizine (ZYRTEC) 10 MG tablet Take 10 mg by mouth as needed for allergies.   Past Month  ? lisinopril-hydrochlorothiazide (ZESTORETIC) 10-12.5 MG tablet Take 1 tablet by mouth daily.   Past Month  ? potassium chloride 20 MEQ/15ML (10%) SOLN Take 30 mLs (40 mEq total) by mouth 3 (three) times daily for 1 day. 90 mL 0 07/30/2021  ? ?Scheduled: ? fentaNYL      ? heparin  5,000 Units Subcutaneous Q8H  ?  polyethylene glycol  17 g Oral Q4H  ? potassium chloride  40 mEq Oral Once  ? sodium chloride flush      ? sodium chloride flush      ? ?Continuous: ? lactated ringers Stopped (07/31/21 1110)  ? magnesium sulfate bolus IVPB    ? ?ASN:[id number] **OR** acetaminophen, HYDROmorphone (DILAUDID) injection, ipratropium-albuterol, ondansetron **OR** ondansetron (ZOFRAN) IV ? ?Allergies  ?Allergen Reactions  ? Apple Juice Anaphylaxis  ?  Throat swelling ?  ? ? ? ?ROS:  ?A comprehensive review of systems was negative except for: Gastrointestinal: positive for abdominal pain, change in bowel habits, diarrhea, and nausea ? ?Blood pressure 108/80, pulse 88, temperature 98 ?F (36.7 ?C), temperature source Axillary, resp. rate 18, height '5\' 8"'$  (1.727 m), weight 72.1 kg, SpO2 100 %. ?Physical Exam ?Vitals reviewed.  ?Constitutional:   ?   Appearance: Normal appearance.  ?HENT:  ?   Head: Normocephalic.  ?   Nose: Nose normal.  ?   Mouth/Throat:  ?   Mouth: Mucous membranes are moist.  ?Eyes:  ?   Extraocular Movements: Extraocular movements intact.  ?Cardiovascular:  ?   Rate and Rhythm: Normal rate.  ?Pulmonary:  ?   Effort: Pulmonary effort is normal.  ?Abdominal:  ?  General: There is no distension.  ?   Palpations: Abdomen is soft.  ?   Tenderness: There is abdominal tenderness in the suprapubic area. There is no guarding or rebound.  ?   Comments: Lower abdomen tenderness   ?Musculoskeletal:  ?   Cervical back: Normal range of motion.  ?   Right lower leg: No edema.  ?   Left lower leg: No edema.  ?Skin: ?   General: Skin is warm.  ?Neurological:  ?   General: No focal deficit present.  ?   Mental Status: He is alert and oriented to person, place, and time.  ?Psychiatric:     ?   Mood and Affect: Mood normal.     ?   Behavior: Behavior normal.     ?   Thought Content: Thought content normal.  ? ? ?Results: personally reviewed- KUB with colon to 9 cm with air  ?No results found for this or any previous visit (from the past  48 hour(s)). ? ?DG Chest Port 1 View ? ?Result Date: 07/31/2021 ?CLINICAL DATA:  Abdominal pain. EXAM: PORTABLE CHEST 1 VIEW COMPARISON:  April 10, 2018. FINDINGS: The heart size and mediastinal contours are within normal limits. Both lungs are clear. The visualized skeletal structures are unremarkable. IMPRESSION: No active disease. Electronically Signed   By: Marijo Conception M.D.   On: 07/31/2021 11:58  ? ?DG Abd Portable 1V ? ?Result Date: 07/31/2021 ?CLINICAL DATA:  Abdominal pain post colonoscopy. EXAM: PORTABLE ABDOMEN - 1 VIEW COMPARISON:  Limited correlation made with chest radiographs 04/10/2018. FINDINGS: Two supine views are submitted. There is moderate dilatation of the transverse colon. No supine evidence of free intraperitoneal air or pneumatosis. Severe right hip osteoarthritis noted. No acute osseous findings are seen. IMPRESSION: 1. Nonspecific dilatation of the transverse colon following colonoscopy. 2. No supine evidence of pneumoperitoneum. That would be better addressed with an erect view of the chest. 3. Severe right hip osteoarthritis. Electronically Signed   By: Richardean Sale M.D.   On: 07/31/2021 11:29   ? ? ?Assessment & Plan:  ?Todd Richardson is a 66 y.o. male with a colon mass in the sigmoid that is cancer until proven otherwise, and concern for large bowel obstruction given the pain after colonoscopy, poor prep and inability to pass anything aside from a pediatric scope. He will not be able to go home and have an elective procedure given this. We will try to see if clears and miralax q4 will clear him out but discussed high likelihood of colostomy.  ?-CEA ordered  ?-CT c/a/p ordered  ?- Clear diet ?- Miralax q 4  ?- Posted for Thursday will discuss further tomorrow  ? ?All questions were answered to the satisfaction of the patient. ? ? ?Virl Cagey ?07/31/2021, 3:13 PM  ? ? ? ? ? ?

## 2021-07-31 NOTE — Assessment & Plan Note (Signed)
-  In the setting of almost near complete obstruction from colon mass ?-High concerns for malignancy ?-Images for staging and further evaluation has been already ordered by GI service ?-CEA will be followed ?-General surgery has been consulted with anticipation for bowel prep and anticipated resection on Thursday 08/02/21. ?-Okay for clear liquid diet, as needed analgesics and if needed antiemetics ?-Provide IV fluids and supportive care. ? ?

## 2021-07-31 NOTE — Assessment & Plan Note (Addendum)
-   Affecting sigmoid colon as per colonoscopy evaluation ?-Biopsy has been taken and results pending. ?-Staging with CT abdomen and pelvis and CT chest has already been ordered by GI service ?-Will check CEA ?-General surgery has been consulted with anticipated surgical resection on 08/02/2021 ?

## 2021-07-31 NOTE — Assessment & Plan Note (Signed)
-  No prior history of diabetes ?-Check A1c ? ?

## 2021-07-31 NOTE — H&P (View-Only) (Signed)
[facility] Consult ? ?Reason for Consult: Sigmoid cancer, large bowel obstruction ?Referring Physician:  Dr. Abbey Chatters and Dr. Dyann Kief ? ? ? ?HPI: Todd Richardson is a 66 y.o. male with reported diarrhea since February, no appetite. He feels like he knew he had cancer. He had never had a colonoscopy. He denies any bloody Bms or dark stools. He had a colonoscopy today that demonstrated a long sigmoid colon cancer that required peds scope to pass and concern for large bowel obstruction follow it  given a large amount of gas in the colon and poor preparation. ? ?The patient says he drank about 1/2 of the preparation and could not drink any more. He has been having small liquid stools.  ? ?Past Medical History:  ?Diagnosis Date  ? Arthritis   ? Asthma   ? Vertigo   ? ? ?Past Surgical History:  ?Procedure Laterality Date  ? CATARACT EXTRACTION Left   ? ? ?Family History  ?Problem Relation Age of Onset  ? Hypertension Mother   ? Diabetes Mother   ? Parkinson's disease Father   ? Dementia Father   ? ? ?Social History  ? ?Tobacco Use  ? Smoking status: Never  ? Smokeless tobacco: Never  ?Vaping Use  ? Vaping Use: Never used  ?Substance Use Topics  ? Alcohol use: Yes  ?  Alcohol/week: 4.0 standard drinks  ?  Types: 4 Cans of beer per week  ?  Comment: every day  ? Drug use: Never  ? ? ?Medications: I have reviewed the patient's current medications. ?Prior to Admission:  ?Medications Prior to Admission  ?Medication Sig Dispense Refill Last Dose  ? aspirin 325 MG tablet Take 325 mg by mouth as needed.   Past Month  ? cetirizine (ZYRTEC) 10 MG tablet Take 10 mg by mouth as needed for allergies.   Past Month  ? lisinopril-hydrochlorothiazide (ZESTORETIC) 10-12.5 MG tablet Take 1 tablet by mouth daily.   Past Month  ? potassium chloride 20 MEQ/15ML (10%) SOLN Take 30 mLs (40 mEq total) by mouth 3 (three) times daily for 1 day. 90 mL 0 07/30/2021  ? ?Scheduled: ? fentaNYL      ? heparin  5,000 Units Subcutaneous Q8H  ?  polyethylene glycol  17 g Oral Q4H  ? potassium chloride  40 mEq Oral Once  ? sodium chloride flush      ? sodium chloride flush      ? ?Continuous: ? lactated ringers Stopped (07/31/21 1110)  ? magnesium sulfate bolus IVPB    ? ?PPJ:[id number] **OR** acetaminophen, HYDROmorphone (DILAUDID) injection, ipratropium-albuterol, ondansetron **OR** ondansetron (ZOFRAN) IV ? ?Allergies  ?Allergen Reactions  ? Apple Juice Anaphylaxis  ?  Throat swelling ?  ? ? ? ?ROS:  ?A comprehensive review of systems was negative except for: Gastrointestinal: positive for abdominal pain, change in bowel habits, diarrhea, and nausea ? ?Blood pressure 108/80, pulse 88, temperature 98 ?F (36.7 ?C), temperature source Axillary, resp. rate 18, height '5\' 8"'$  (1.727 m), weight 72.1 kg, SpO2 100 %. ?Physical Exam ?Vitals reviewed.  ?Constitutional:   ?   Appearance: Normal appearance.  ?HENT:  ?   Head: Normocephalic.  ?   Nose: Nose normal.  ?   Mouth/Throat:  ?   Mouth: Mucous membranes are moist.  ?Eyes:  ?   Extraocular Movements: Extraocular movements intact.  ?Cardiovascular:  ?   Rate and Rhythm: Normal rate.  ?Pulmonary:  ?   Effort: Pulmonary effort is normal.  ?Abdominal:  ?  General: There is no distension.  ?   Palpations: Abdomen is soft.  ?   Tenderness: There is abdominal tenderness in the suprapubic area. There is no guarding or rebound.  ?   Comments: Lower abdomen tenderness   ?Musculoskeletal:  ?   Cervical back: Normal range of motion.  ?   Right lower leg: No edema.  ?   Left lower leg: No edema.  ?Skin: ?   General: Skin is warm.  ?Neurological:  ?   General: No focal deficit present.  ?   Mental Status: He is alert and oriented to person, place, and time.  ?Psychiatric:     ?   Mood and Affect: Mood normal.     ?   Behavior: Behavior normal.     ?   Thought Content: Thought content normal.  ? ? ?Results: personally reviewed- KUB with colon to 9 cm with air  ?No results found for this or any previous visit (from the past  48 hour(s)). ? ?DG Chest Port 1 View ? ?Result Date: 07/31/2021 ?CLINICAL DATA:  Abdominal pain. EXAM: PORTABLE CHEST 1 VIEW COMPARISON:  April 10, 2018. FINDINGS: The heart size and mediastinal contours are within normal limits. Both lungs are clear. The visualized skeletal structures are unremarkable. IMPRESSION: No active disease. Electronically Signed   By: Marijo Conception M.D.   On: 07/31/2021 11:58  ? ?DG Abd Portable 1V ? ?Result Date: 07/31/2021 ?CLINICAL DATA:  Abdominal pain post colonoscopy. EXAM: PORTABLE ABDOMEN - 1 VIEW COMPARISON:  Limited correlation made with chest radiographs 04/10/2018. FINDINGS: Two supine views are submitted. There is moderate dilatation of the transverse colon. No supine evidence of free intraperitoneal air or pneumatosis. Severe right hip osteoarthritis noted. No acute osseous findings are seen. IMPRESSION: 1. Nonspecific dilatation of the transverse colon following colonoscopy. 2. No supine evidence of pneumoperitoneum. That would be better addressed with an erect view of the chest. 3. Severe right hip osteoarthritis. Electronically Signed   By: Richardean Sale M.D.   On: 07/31/2021 11:29   ? ? ?Assessment & Plan:  ?Todd Richardson is a 66 y.o. male with a colon mass in the sigmoid that is cancer until proven otherwise, and concern for large bowel obstruction given the pain after colonoscopy, poor prep and inability to pass anything aside from a pediatric scope. He will not be able to go home and have an elective procedure given this. We will try to see if clears and miralax q4 will clear him out but discussed high likelihood of colostomy.  ?-CEA ordered  ?-CT c/a/p ordered  ?- Clear diet ?- Miralax q 4  ?- Posted for Thursday will discuss further tomorrow  ? ?All questions were answered to the satisfaction of the patient. ? ? ?Virl Cagey ?07/31/2021, 3:13 PM  ? ? ? ? ? ?

## 2021-07-31 NOTE — Progress Notes (Signed)
Dr. Constance Haw General surgery in to consult with patient at bedside Endoscopy Postoperative room 3. ?

## 2021-07-31 NOTE — Discharge Instructions (Addendum)
Ileostomy Output: ? ?Monitor how much output you have from your ileostomy a day and drink at least twice that amount in fluids as you will also have losses from sweat and urine.  ?Example: 1 L output you need 2 L of intake  ? ?If you are having more than 1 liter of output a day, please start these medications in a stepwise fashion. ?Metamucil 34 g packet in 30 mL of water daily.  ?Imodium 4 mg four times a day; 30 minutes before meals and before bed.  ?Lomotil 2.5 mg four times a day; 30 minutes before meals and before bed.  ?Cholestyramine 4 g four times a day; 30 minutes before meals and before bed.  ?(You will need prescriptions for these medications.) ? ?Diet Changes: ?- Chew food well.  ?- Eat low sugar foods and drinks.  ?- Eat salty foods and add salt to meals and snacks.  ?- Eat smaller more frequent meals and snacks.  ?- Drink fluids ? hour before or after meals, not with food.  ?- Avoid alcohol and caffeine.  ?- Eat more soluble fiber which forms a gel when mixed with water and slows movement.  ?- Good sources of soluble fiber include: peeled sweet potatoes, applesauce, refried beans, wheat and oat bran.  ? ?Try adding these foods that naturally thicken stool to your meals:  ?- Applesauce  ?- Cream of rice  ?- Peanut butter (creamy)  ?- Bananas  ?- Marshmallows ?- Rice  ?- Cheese  ?- Mashed potatoes  ?- Soda crackers  ?- Tapioca    ? ?If you are still having trouble with output, please let us know in your office [phone]).   ?You may need to adjust these medications as you have less output.  ? ? ? ? ?IMPORTANT INFORMATION: PAY CLOSE ATTENTION  ? ?PHYSICIAN DISCHARGE INSTRUCTIONS ? ?Follow with Primary care provider  [name], MD  and other consultants as instructed by your Hospitalist Physician ? ?SEEK MEDICAL CARE OR RETURN TO EMERGENCY ROOM IF SYMPTOMS COME BACK, WORSEN OR NEW PROBLEM DEVELOPS  ? ?Please note: ?You were cared for by a hospitalist during your hospital stay. Every effort will  be made to forward records to your primary care provider.  You can request that your primary care provider send for your hospital records if they have not received them.  Once you are discharged, your primary care physician will handle any further medical issues. Please note that NO REFILLS for any discharge medications will be authorized once you are discharged, as it is imperative that you return to your primary care physician (or establish a relationship with a primary care physician if you do not have one) for your post hospital discharge needs so that they can reassess your need for medications and monitor your lab values. ? ?Please get a complete blood count and chemistry panel checked by your Primary MD at your next visit, and again as instructed by your Primary MD. ? ?Get Medicines reviewed and adjusted: ?Please take all your medications with you for your next visit with your Primary MD ? ?Laboratory/radiological data: ?Please request your Primary MD to go over all hospital tests and procedure/radiological results at the follow up, please ask your primary care provider to get all Hospital records sent to his/her office. ? ?In some cases, they will be blood work, cultures and biopsy results pending at the time of your discharge. Please request that your primary care provider follow up on these results. ? ?If you  are diabetic, please bring your blood sugar readings with you to your follow up appointment with primary care.   ? ?Please call and make your follow up appointments as soon as possible.   ? ?Also Note the following: ?If you experience worsening of your admission symptoms, develop shortness of breath, life threatening emergency, suicidal or homicidal thoughts you must seek medical attention immediately by calling 911 or calling your MD immediately  if symptoms less severe. ? ?You must read complete instructions/literature along with all the possible adverse reactions/side effects for all the Medicines  you take and that have been prescribed to you. Take any new Medicines after you have completely understood and accpet all the possible adverse reactions/side effects.  ? ?Do not drive when taking Pain medications or sleeping medications (Benzodiazepines) ? ?Do not take more than prescribed Pain, Sleep and Anxiety Medications. It is not advisable to combine anxiety,sleep and pain medications without talking with your primary care practitioner ? ?Special Instructions: If you have smoked or chewed Tobacco  in the last 2 yrs please stop smoking, stop any regular Alcohol  and or any Recreational drug use. ? ?Wear Seat belts while driving.  Do not drive if taking any narcotic, mind altering or controlled substances or recreational drugs or alcohol.  ? ? ? ? ? ?

## 2021-08-01 DIAGNOSIS — R110 Nausea: Secondary | ICD-10-CM

## 2021-08-01 DIAGNOSIS — K56609 Unspecified intestinal obstruction, unspecified as to partial versus complete obstruction: Secondary | ICD-10-CM | POA: Diagnosis not present

## 2021-08-01 LAB — CBC
HCT: 27.2 % — ABNORMAL LOW (ref 39.0–52.0)
Hemoglobin: 8.7 g/dL — ABNORMAL LOW (ref 13.0–17.0)
MCH: 29.5 pg (ref 26.0–34.0)
MCHC: 32 g/dL (ref 30.0–36.0)
MCV: 92.2 fL (ref 80.0–100.0)
Platelets: 711 K/uL — ABNORMAL HIGH (ref 150–400)
RBC: 2.95 MIL/uL — ABNORMAL LOW (ref 4.22–5.81)
RDW: 16.4 % — ABNORMAL HIGH (ref 11.5–15.5)
WBC: 10.9 K/uL — ABNORMAL HIGH (ref 4.0–10.5)
nRBC: 0 % (ref 0.0–0.2)

## 2021-08-01 LAB — PREPARE RBC (CROSSMATCH)

## 2021-08-01 LAB — BASIC METABOLIC PANEL
Anion gap: 6 (ref 5–15)
BUN: 12 mg/dL (ref 8–23)
CO2: 24 mmol/L (ref 22–32)
Calcium: 8.1 mg/dL — ABNORMAL LOW (ref 8.9–10.3)
Chloride: 102 mmol/L (ref 98–111)
Creatinine, Ser: 0.76 mg/dL (ref 0.61–1.24)
GFR, Estimated: >60 mL/min (ref >60)
Glucose, Bld: 129 mg/dL — ABNORMAL HIGH (ref 70–99)
Potassium: 3.7 mmol/L (ref 3.5–5.1)
Sodium: 132 mmol/L — ABNORMAL LOW (ref 135–145)

## 2021-08-01 LAB — HEMOGLOBIN A1C
Hgb A1c MFr Bld: 5.7 % — ABNORMAL HIGH (ref 4.8–5.6)
Mean Plasma Glucose: 117 mg/dL

## 2021-08-01 LAB — PROTIME-INR
INR: 1.1 (ref 0.8–1.2)
Prothrombin Time: 14.3 seconds (ref 11.4–15.2)

## 2021-08-01 LAB — CEA: CEA: 1.9 ng/mL (ref 0.0–4.7)

## 2021-08-01 LAB — MAGNESIUM: Magnesium: 2.5 mg/dL — ABNORMAL HIGH (ref 1.7–2.4)

## 2021-08-01 LAB — GLUCOSE, CAPILLARY
Glucose-Capillary: 123 mg/dL — ABNORMAL HIGH (ref 70–99)
Glucose-Capillary: 143 mg/dL — ABNORMAL HIGH (ref 70–99)

## 2021-08-01 LAB — SURGICAL PATHOLOGY

## 2021-08-01 MED ORDER — CHLORHEXIDINE GLUCONATE CLOTH 2 % EX PADS
6.0000 | MEDICATED_PAD | Freq: Once | CUTANEOUS | Status: AC
  Administered 2021-08-02: 6 via TOPICAL

## 2021-08-01 MED ORDER — ACETAMINOPHEN 500 MG PO TABS: 1000.0000 mg | ORAL_TABLET | ORAL | Status: AC

## 2021-08-01 MED ORDER — SODIUM CHLORIDE 0.9 % IV SOLN
2.0000 g | INTRAVENOUS | Status: DC
  Filled 2021-08-01: qty 2

## 2021-08-01 MED ORDER — ALVIMOPAN 12 MG PO CAPS: 12.0000 mg | ORAL_CAPSULE | ORAL | Status: AC

## 2021-08-01 MED ORDER — CHLORHEXIDINE GLUCONATE CLOTH 2 % EX PADS
6.0000 | MEDICATED_PAD | Freq: Once | CUTANEOUS | Status: AC
  Administered 2021-08-01: 6 via TOPICAL

## 2021-08-01 MED ORDER — FLEET ENEMA 7-19 GM/118ML RE ENEM
1.0000 | ENEMA | Freq: Once | RECTAL | Status: AC
  Administered 2021-08-02: 1 via RECTAL

## 2021-08-01 MED ORDER — SODIUM CHLORIDE 0.9 % IV SOLN: INTRAVENOUS | Status: DC

## 2021-08-01 MED ORDER — LACTATED RINGERS IV SOLN: INTRAVENOUS | Status: DC

## 2021-08-01 MED ORDER — ENSURE PRE-SURGERY PO LIQD
296.0000 mL | Freq: Once | ORAL | Status: AC
  Administered 2021-08-02: 296 mL via ORAL
  Filled 2021-08-01: qty 296

## 2021-08-01 MED ORDER — NEOMYCIN SULFATE 500 MG PO TABS
1000.0000 mg | ORAL_TABLET | ORAL | Status: AC
  Administered 2021-08-01 (×3): 1000 mg via ORAL
  Filled 2021-08-01 (×4): qty 2

## 2021-08-01 MED ORDER — BISACODYL 5 MG PO TBEC
20.0000 mg | DELAYED_RELEASE_TABLET | Freq: Once | ORAL | Status: AC
  Administered 2021-08-01: 20 mg via ORAL
  Filled 2021-08-01: qty 4

## 2021-08-01 MED ORDER — METRONIDAZOLE 500 MG PO TABS
1000.0000 mg | ORAL_TABLET | ORAL | Status: AC
  Administered 2021-08-01 (×3): 1000 mg via ORAL
  Filled 2021-08-01 (×3): qty 2

## 2021-08-01 MED ORDER — ENSURE PRE-SURGERY PO LIQD
592.0000 mL | Freq: Once | ORAL | Status: AC
  Administered 2021-08-01: 592 mL via ORAL
  Filled 2021-08-01: qty 592

## 2021-08-01 NOTE — Progress Notes (Signed)
?PROGRESS NOTE ? ? ? ? ?Todd Richardson, is a 66 y.o. male, DOB - Jun 17, 1955, TIW:[id number] ? ?Admit date - 07/31/2021   Admitting Physician Barton Dubois, MD ? ?Outpatient Primary MD for the patient is [name], MD ? ?LOS - 1 ? ?CC -Abd pain ?    ? ? ?Brief Narrative:  ? ?66 y.o. male with medical history significant of hypertension, asthma  and fam h/o Colon cancer in his father admitted on 07/31/21 with abdominal pain and bowel obstruction with concerns for cecal and sigmoid malignancy ? ?  ?-Assessment and Plan: ?* Large bowel obstruction due to cecal and sigmoid mass ?-In the setting of almost near complete obstruction from colon mass ?-High concerns for malignancy--especially given family history of colon cancer in his father and lack of prior screening colonoscopy until now ?-Colonoscopy on 07/31/2021 with obstructing colon mass (A frond-like/villous, fungating, infiltrative and polypoid nearly completely obstructing mass was found in the sigmoid colon approx 15 cm from the anal verge. The mass was circumferential. The mass measured five cm in length. Oozing was present. Biopsies were taken with a cold forceps for histology ?-Patient admits to about 30 pound weight loss over the last 3 months along with generalized weakness and poor appetite ?-CEA is not elevated ?-GI and general surgery consult appreciated ?-Plan for colon prep today with possible surgical intervention on 07/2021 ? ?Thrombocytosis ?--Could be reactive ? ?Hyponatremia ?--Suspect dehydration related ?-Given IV fluids pending return of bowel function and tolerance of oral intake ? ?Leukocytosis ?-Most likely a stress demargination and dehydration/reactive ?-Currently no frank/specific source of infection identified ?-Holding on antibiotics. ? ?Hyperglycemia ?-No prior history of diabetes ?-Check A1c ? ?Hypokalemia ?-Due to GI losses, replaced ? ?HTN (hypertension) ?- Blood pressure overall stable ?-In the setting of hypokalemia and dehydration we  will hold lisinopril/HCTZ ?- ?Social/ethics--- plan of care and advanced directives discussed with patient, his brother [name]/[name] present at bedside ?-Patient is a full ? ?Acute anemia--- suspect this is related to underlying GI malignancy, hemoglobin currently above 8  ?--monitor and transfuse as clinically indicated ? ?Disposition/Need for in-Hospital Stay- patient unable to be discharged at this time due to --- bowel obstruction concerns for colon malignancy, requiring IV fluids pending return of bowel function and tolerance of oral intake, as well as surgical intervention ? ?Status is: Inpatient  ? ?Disposition: The patient is from: Home ?             Anticipated d/c is to: Home ?             Anticipated d/c date is: > 3 days ?             Patient currently is not medically stable to d/c. ?Barriers: Not Clinically Stable-  ? ?Code Status :  -  Code Status: Full Code  ? ?Family Communication:   (patient is alert, awake and coherent)  ?-Discussed with his brother [name] [name]) at bedside ? ?DVT Prophylaxis  :   - SCDs  SCD's Start: 08/01/21 0938 ?heparin injection 5,000 Units Start: 07/31/21 1800 ? ? ?Lab Results  ?Component Value Date  ? PLT 711 (H) 08/01/2021  ? ?Inpatient Medications ? ?Scheduled Meds: ? acetaminophen  1,000 mg Oral On Call to OR  ? alvimopan  12 mg Oral On Call to OR  ? Chlorhexidine Gluconate Cloth  6 each Topical Once  ? And  ? Chlorhexidine Gluconate Cloth  6 each Topical Once  ? [START ON 08/02/2021] feeding supplement  296 mL  Oral Once  ? feeding supplement  592 mL Oral Once  ? heparin  5,000 Units Subcutaneous Q8H  ? neomycin  1,000 mg Oral 3 times per day  ? And  ? metroNIDAZOLE  1,000 mg Oral 3 times per day  ? polyethylene glycol  17 g Oral Q4H  ? [START ON 08/02/2021] sodium phosphate  1 enema Rectal Once  ? ?Continuous Infusions: ? cefoTEtan (CEFOTAN) IV    ? lactated ringers 100 mL/hr at 08/01/21 1134  ? ?PRN Meds:.acetaminophen **OR** acetaminophen, HYDROmorphone (DILAUDID)  injection, ipratropium-albuterol, ondansetron **OR** ondansetron (ZOFRAN) IV ? ? ?Anti-infectives (From admission, onward)  ? ? Start     Dose/Rate Route Frequency Ordered Stop  ? 08/01/21 1400  neomycin (MYCIFRADIN) tablet 1,000 mg       ?See Hyperspace for full Linked Orders Report.  ? 1,000 mg Oral 3 times per day 08/01/21 0937 08/02/21 1359  ? 08/01/21 1400  metroNIDAZOLE (FLAGYL) tablet 1,000 mg       ?See Hyperspace for full Linked Orders Report.  ? 1,000 mg Oral 3 times per day 08/01/21 0937 08/02/21 1359  ? 08/01/21 1030  cefoTEtan (CEFOTAN) 2 g in sodium chloride 0.9 % 100 mL IVPB       ? 2 g ?200 mL/hr over 30 Minutes Intravenous On call to O.R. 08/01/21 9147 08/02/21 0559  ? ?  ? ?Subjective: ?Todd Richardson today has no fevers,No chest pain,   ? ?-Nausea but no further emesis ?-Having difficulty with bowel prep ?-Abdominal pain improved after opiates ?--Discussed with his brother [name] [name]) at bedside ? ?Objective: ?Vitals:  ? 07/31/21 2251 08/01/21 0348 08/01/21 0349 08/01/21 1224  ?BP: 96/72 103/89  111/86  ?Pulse: 87 85 78 67  ?Resp: '16 18  18  '$ ?Temp: 98 ?F (36.7 ?C) 97.8 ?F (36.6 ?C)  (!) 97.5 ?F (36.4 ?C)  ?TempSrc:  Oral  Oral  ?SpO2: 99% (!) 85% 96% 94%  ?Weight:      ?Height:      ? ? ?Intake/Output Summary (Last 24 hours) at 08/01/2021 1545 ?Last data filed at 08/01/2021 1331 ?Gross per 24 hour  ?Intake 1876.42 ml  ?Output 3 ml  ?Net 1873.42 ml  ? ?Filed Weights  ? 07/31/21 1001  ?Weight: 72.1 kg  ? ? ?Physical Exam ? ?Gen:- Awake Alert, pale appearing ?HEENT:- [place].AT, No sclera icterus ?Neck-Supple Neck,No JVD,.  ?Lungs-  CTAB , fair symmetrical air movement ?CV- S1, S2 normal, regular  ?Abd-diminished breath sounds, somewhat distended, somewhat tender ?Extremity/Skin:- No  edema, pedal pulses present  ?Psych-affect is appropriate, oriented x3 ?Neuro-generalized weakness, no new focal deficits, no tremors ? ?Data Reviewed: I have personally reviewed following labs and imaging  studies ? ?CBC: ?Recent Labs  ?Lab 07/31/21 ?1547 08/01/21 ?0454  ?WBC 14.6* 10.9*  ?HGB 9.8* 8.7*  ?HCT 30.4* 27.2*  ?MCV 91.8 92.2  ?PLT 799* 711*  ? ?Basic Metabolic Panel: ?Recent Labs  ?Lab 07/27/21 ?0958 07/31/21 ?1547 08/01/21 ?0454  ?NA 134* 133* 132*  ?K 3.0* 3.1* 3.7  ?CL 102 104 102  ?CO2 '25 23 24  '$ ?GLUCOSE 116* 151* 129*  ?BUN '12 13 12  '$ ?CREATININE 0.88 0.73 0.76  ?CALCIUM 8.0* 8.2* 8.1*  ?MG  --  2.3 2.5*  ?PHOS  --  3.8  --   ? ?GFR: ?Estimated Creatinine Clearance: 90.3 mL/min (by C-G formula based on SCr of 0.76 mg/dL). ?Liver Function Tests: ?Recent Labs  ?Lab 07/31/21 ?1547  ?AST 20  ?ALT 15  ?ALKPHOS 83  ?  BILITOT 0.5  ?PROT 5.5*  ?ALBUMIN 2.3*  ? ?Cardiac Enzymes: ?No results for input(s): CKTOTAL, CKMB, CKMBINDEX, TROPONINI in the last 168 hours. ?BNP (last 3 results) ?No results for input(s): PROBNP in the last 8760 hours. ?HbA1C: ?Recent Labs  ?  07/31/21 ?1547  ?HGBA1C 5.7*  ? ?Sepsis Labs: ?'@LABRCNTIP'$ (procalcitonin:4,lacticidven:4) ?)No results found for this or any previous visit (from the past 240 hour(s)).  ? ? ?Radiology Studies: ?CT CHEST W CONTRAST ? ?Result Date: 07/31/2021 ?CLINICAL DATA:  This is a cancer staging workup. There is a large sigmoid mass obstructing the large intestine. Evaluate for metastasis and/or adenopathy. EXAM: CT CHEST, ABDOMEN, AND PELVIS WITH CONTRAST TECHNIQUE: Multidetector CT imaging of the chest, abdomen and pelvis was performed following the standard protocol during bolus administration of intravenous contrast. RADIATION DOSE REDUCTION: This exam was performed according to the departmental dose-optimization program which includes automated exposure control, adjustment of the mA and/or kV according to patient size and/or use of iterative reconstruction technique. CONTRAST:  154m OMNIPAQUE IOHEXOL 300 MG/ML  SOLN COMPARISON:  No prior cross-sectional imaging for comparison. There is a portable chest from today and PA Lat chest from 04/10/2018, and a portable  abdomen film from today FINDINGS: CT CHEST FINDINGS Cardiovascular: The cardiac size is normal. There is scattered three-vessel calcific CAD scattered calcific aortic atherosclerosis and 3.8 cm ectasia of the ascending segment.

## 2021-08-01 NOTE — Progress Notes (Signed)
?Subjective: ?endorses pain around umbilicus, more of a discomfort. He reports ongoing nausea but no vomiting. Has had 5-6 BMs this morning, all fairly small in nature, stools were liquid. Notes some pink coloration mixed in with stool at times. Denies any melena. States he has not eaten very much in the past month, diarrhea prior to colonoscopy since mid February. Reports approx 30 pound weight loss since December. Reports acute illness in December with respiratory symptoms, n/v and loss of taste, tested negative for covid but suspects that is what he had. States taste is still not back to normal. Is passing some gas though not a lot. ? ?Objective: ?Vital signs in last 24 hours: ?Temp:  [97.8 ?F (36.6 ?C)-98 ?F (36.7 ?C)] 97.8 ?F (36.6 ?C) (04/05 0348) ?Pulse Rate:  [75-91] 78 (04/05 0349) ?Resp:  [12-28] 18 (04/05 0348) ?BP: (96-156)/(69-116) 103/89 (04/05 0348) ?SpO2:  [85 %-100 %] 96 % (04/05 0349) ?Weight:  [72.1 kg] 72.1 kg (04/04 1001) ?Last BM Date : 07/31/21 ?General:   Alert and oriented, pleasant ?Head:  Normocephalic and atraumatic. ?Eyes:  No icterus, sclera clear. Conjuctiva pink.  ?Mouth:  Without lesions, mucosa pink and moist.  ?Heart:  S1, S2 present, no murmurs noted.  ?Lungs: Clear to auscultation bilaterally, without wheezing, rales, or rhonchi.  ?Abdomen:  Bowel sounds hypoactive, soft, non-distended. Mild ttp R of umbillicus. No HSM or hernias noted. No rebound or guarding. No masses appreciated  ?Msk:  Symmetrical without gross deformities. Normal posture. ?Pulses:  Normal pulses noted. ?Extremities:  Without clubbing or edema. ?Neurologic:  Alert and  oriented x4;  grossly normal neurologically. ?Skin:  Warm and dry, intact without significant lesions.  ?Psych:  Alert and cooperative. Normal mood and affect. ? ?Intake/Output from previous day: ?04/04 0701 - 04/05 0700 ?In: 1260.3 [P.O.:480; I.V.:680.3; IV Piggyback:100] ?Out: -  ? ? ?Lab Results: ?Recent Labs  ?  07/31/21 ?1547  08/01/21 ?0454  ?WBC 14.6* 10.9*  ?HGB 9.8* 8.7*  ?HCT 30.4* 27.2*  ?PLT 799* 711*  ? ?BMET ?Recent Labs  ?  07/31/21 ?1547 08/01/21 ?0454  ?NA 133* 132*  ?K 3.1* 3.7  ?CL 104 102  ?CO2 23 24  ?GLUCOSE 151* 129*  ?BUN 13 12  ?CREATININE 0.73 0.76  ?CALCIUM 8.2* 8.1*  ? ?LFT ?Recent Labs  ?  07/31/21 ?1547  ?PROT 5.5*  ?ALBUMIN 2.3*  ?AST 20  ?ALT 15  ?ALKPHOS 83  ?BILITOT 0.5  ? ? ?Studies/Results: ?CT CHEST W CONTRAST ? ?Result Date: 07/31/2021 ?CLINICAL DATA:  This is a cancer staging workup. There is a large sigmoid mass obstructing the large intestine. Evaluate for metastasis and/or adenopathy. EXAM: CT CHEST, ABDOMEN, AND PELVIS WITH CONTRAST TECHNIQUE: Multidetector CT imaging of the chest, abdomen and pelvis was performed following the standard protocol during bolus administration of intravenous contrast. RADIATION DOSE REDUCTION: This exam was performed according to the departmental dose-optimization program which includes automated exposure control, adjustment of the mA and/or kV according to patient size and/or use of iterative reconstruction technique. CONTRAST:  185m OMNIPAQUE IOHEXOL 300 MG/ML  SOLN COMPARISON:  No prior cross-sectional imaging for comparison. There is a portable chest from today and PA Lat chest from 04/10/2018, and a portable abdomen film from today FINDINGS: CT CHEST FINDINGS Cardiovascular: The cardiac size is normal. There is scattered three-vessel calcific CAD scattered calcific aortic atherosclerosis and 3.8 cm ectasia of the ascending segment. Remaining aorta is normal caliber without dissection or penetrating ulcer. The pulmonary veins are decompressed. Pulmonary arteries are  normal caliber and centrally clear and there is no pericardial effusion. Mediastinum/Nodes: There is a small hiatal hernia and mild thickening in the mid to distal thoracic esophagus probably due to reflux or other esophagitis. Visualized thyroid gland is unremarkable. Axillary spaces are clear. There is  subareolar gynecomastia both breasts. There is no intrathoracic adenopathy. The trachea is patent. Lungs/Pleura: No pleural effusion, thickening or pneumothorax. There are scattered scarring changes in the anterior lung bases. There are tiny calcified granulomas in the left upper and lower lobes. The lungs clear of infiltrates and nodules. The central airways are clear. 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 Mild thoracic kyphosis. No destructive bone lesion in the thorax is seen. CT ABDOMEN PELVIS FINDINGS Hepatobiliary: There is a tiny hypodensity in the dome of the right lobe of the liver on 8:42 which is too small to characterize. Liver is mildly steatotic and otherwise unremarkable. The gallbladder and bile ducts are unremarkable. Pancreas: No focal abnormality or ductal dilatation. Spleen: No focal abnormality or splenomegaly. Adrenals/Urinary Tract: There is no adrenal mass, no focal abnormality of the renal cortex, and no urinary stone or obstruction. There is mild bladder thickening versus underdistention. Stomach/Bowel: The stomach is contracted. The contrast fills the small bowel normally. There is no small bowel obstruction or inflammation. An appendix is not seen this patient. There is irregular circumferential wall thickening causing moderate to severe narrowing of the lumen of the cecum and proximal ascending colon over a 10 cm length of involvement, strongly concerning for infiltrating neoplasm. The upstream small bowel is not dilated but there is dilatation in the more distal ascending colon the transverse and descending colon up to 8 cm, with fluid levels. There is inflammatory stranding around the thickened cecum, small volume ascites in the perihepatic and bilateral pericolic gutter spaces. There is a large encircling mass of the proximal to mid sigmoid colon, effacing its lumen measuring up to 10 cm length, 5.8 cm transversely and  5.7 cm craniocaudal consistent with neoplasm and with no discernible lumen through it. More distal sigmoid and rectum are unremarkable. There are stranding changes around the sigmoid mass. Vascular/Lymphatic: Mild aortic atherosclerosis without aneurysm. There are scattered perisigmoid mesenteric lymph nodes, largest measuring 10 mm in short axis on 8:98. There are additional mesenteric nodes medial to the proximal ascending colon measuring up to 1.1 cm short axis. Periportal space lymph nodes are noted, with few of these up to 1.3 cm in short axis on 8:55 with borderline prominent portacaval space nodes. There is no bulky, encasing or further adenopathy. Reproductive: Prostate within normal size limits with central calcifications, mild impression on the posterior bladder. Seminal vesicles are unremarkable. Other: As above there is a small volume of perihepatic ascites, additional mild perisplenic ascites and bilateral mild paracolic gutter ascites, with trace ascites in the posterior deep pelvis. There are small inguinal fat hernias. 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 There are degenerative changes of the lumbar spine. Anterior bridging osteophytes left SI joint. No destructive bone lesion. Acquired spinal stenosis L4-5. there is a 1.4 cm lucent lesion to the right within the L3 vertebral body, but there is trabecular coarsening within it and this is believed to be a hemangioma. IMPRESSION: 1. 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 There is no discernible lumen through this. 2. Additional irregular masslike wall thickening, apparently nonobstructing involves the cecum and  proximal ascending colon as well, with additional adjacent stranding. There is no upstream distal ileal dilatation. 3. Small lymph nodes are seen medial to the proximal ascending colon and alongside the sigmoid mass but

## 2021-08-01 NOTE — TOC Initial Note (Signed)
Transition of Care (TOC) - Initial/Assessment Note  ? ? ?Patient Details  ?Name: Todd Richardson ?MRN: [id number] ?Date of Birth: 11-26-1955 ? ?Transition of Care (TOC) CM/SW Contact:    ?Iona Beard, LCSWA ?Phone Number: ?08/01/2021, 12:17 PM ? ?Clinical Narrative:                 ?Pt is high risk for readmission. CSW spoke with pt in room to complete assessment. Pt states that he lives alone. Pt is independent in completing his ADLs and drives when needed. Pt states that he has not had HH in the past. Pt states that after surgery he would like to speak about [place] and if it is needed. Pt states that he has a cane and uses it when needed. TOC to follow.  ? ?Expected Discharge Plan: Home/Self Care ?Barriers to Discharge: Continued Medical Work up ? ? ?Patient Goals and CMS Choice ?Patient states their goals for this hospitalization and ongoing recovery are:: Return home ?CMS Medicare.gov Compare Post Acute Care list provided to:: Patient ?Choice offered to / list presented to : Patient ? ?Expected Discharge Plan and Services ?Expected Discharge Plan: Home/Self Care ?In-house Referral: Clinical Social Work ?Discharge Planning Services: CM Consult ?  ?Living arrangements for the past 2 months: [place] ?                ?  ?  ?  ?  ?  ?  ?  ?  ?  ?  ? ?Prior Living Arrangements/Services ?Living arrangements for the past 2 months: [place] ?Lives with:: Self ?Patient language and need for interpreter reviewed:: Yes ?Do you feel safe going back to the place where you live?: Yes      ?Need for Family Participation in Patient Care: Yes (Comment) ?Care giver support system in place?: Yes (comment) ?  ?Criminal Activity/Legal Involvement Pertinent to Current Situation/Hospitalization: No - Comment as needed ? ?Activities of Daily Living ?Home Assistive Devices/Equipment: Cane (specify quad or straight) (straight) ?ADL Screening (condition at time of admission) ?Patient's cognitive ability adequate to safely  complete daily activities?: Yes ?Is the patient deaf or have difficulty hearing?: No ?Does the patient have difficulty seeing, even when wearing glasses/contacts?: No ?Does the patient have difficulty concentrating, remembering, or making decisions?: No ?Patient able to express need for assistance with ADLs?: Yes ?Does the patient have difficulty dressing or bathing?: No ?Independently performs ADLs?: Yes (appropriate for developmental age) ?Does the patient have difficulty walking or climbing stairs?: Yes ?Weakness of Legs: Both ?Weakness of Arms/Hands: None ? ?Permission Sought/Granted ?  ?  ?   ?   ?   ?   ? ?Emotional Assessment ?Appearance:: Appears stated age ?Attitude/Demeanor/Rapport: Engaged ?Affect (typically observed): Accepting ?Orientation: : Oriented to Self, Oriented to Place, Oriented to  Time, Oriented to Situation ?Alcohol / Substance Use: Not Applicable ?Psych Involvement: No (comment) ? ?Admission diagnosis:  Large bowel obstruction ([place]) [K56.609] ?Patient Active Problem List  ? Diagnosis Date Noted  ? Large bowel obstruction ([place]) 07/31/2021  ? Colon cancer ([place]) 07/31/2021  ? Abdominal pain 07/31/2021  ? HTN (hypertension) 07/31/2021  ? Hypokalemia 07/31/2021  ? Hyperglycemia 07/31/2021  ? Leukocytosis 07/31/2021  ? Hyponatremia 07/31/2021  ? Thrombocytosis 07/31/2021  ? ?PCP:  [name], MD ?Pharmacy:   ?[facility], [address] ?[place] [address] ?Phone: [phone] Fax: [phone] ? ? ? ? ?Social Determinants of Health (SDOH) Interventions ?  ? ?  Readmission Risk Interventions ? ?  08/01/2021  ? 12:16 PM  ?Readmission Risk Prevention Plan  ?Transportation Screening Complete  ?Home Care Screening Complete  ?Medication Review (RN CM) Complete  ? ? ? ?

## 2021-08-01 NOTE — Progress Notes (Signed)
Patient is very pleasant and agreeable with treatment plans. He has ambulated back and forth to the bathroom due to frequent BMs. Complains of abdominal discomfort that is decreased with dilaudid, hiccups and some nausea. He has started his bowel prep for procedure tomorrow and is aware he will be NPO after midnight. LR is continued per MD Courage, he would like patient to received the remainder of the bag and then switch to the NS. ?

## 2021-08-01 NOTE — Progress Notes (Signed)
[facility] Progress Note ? ?1 Day Post-Op  ?Subjective: ?Having hiccups and bloating.  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 Prep was too poor to tell. He has been having some diarrhea. Is nauseated some.  ? ?Objective: ?Vital signs in last 24 hours: ?Temp:  [97.5 ?F (36.4 ?C)-98 ?F (36.7 ?C)] 97.5 ?F (36.4 ?C) (04/05 1224) ?Pulse Rate:  [67-90] 67 (04/05 1224) ?Resp:  [12-28] 18 (04/05 1224) ?BP: (96-130)/(72-116) 111/86 (04/05 1224) ?SpO2:  [85 %-100 %] 94 % (04/05 1224) ?Last BM Date : 08/01/21 ? ?Intake/Output from previous day: ?04/04 0701 - 04/05 0700 ?In: 1260.3 [P.O.:480; I.V.:680.3; IV Piggyback:100] ?Out: -  ?Intake/Output this shift: ?Total I/O ?In: 986.2 [P.O.:240; I.V.:746.2] ?Out: 3 [Stool:3] ? ?General appearance: alert and no distress ?Resp: normal work of breathing ?GI: soft, mildly distended, minimally tender ? ?Lab Results:  ?Recent Labs  ?  07/31/21 ?1547 08/01/21 ?0454  ?WBC 14.6* 10.9*  ?HGB 9.8* 8.7*  ?HCT 30.4* 27.2*  ?PLT 799* 711*  ? ?BMET ?Recent Labs  ?  07/31/21 ?1547 08/01/21 ?0454  ?NA 133* 132*  ?K 3.1* 3.7  ?CL 104 102  ?CO2 23 24  ?GLUCOSE 151* 129*  ?BUN 13 12  ?CREATININE 0.73 0.76  ?CALCIUM 8.2* 8.1*  ? ?PT/INR ?Recent Labs  ?  08/01/21 ?0949  ?LABPROT 14.3  ?INR 1.1  ? ?Personally reviewed- cecal mass in addition to sigmoid, no liver lesions or lung lesions  ?Studies/Results: ?CT CHEST W CONTRAST ? ?Result Date: 07/31/2021 ?CLINICAL DATA:  This is a cancer staging workup. There is a large sigmoid mass obstructing the large intestine. Evaluate for metastasis and/or adenopathy. EXAM: CT CHEST, ABDOMEN, AND PELVIS WITH CONTRAST TECHNIQUE: Multidetector CT imaging of the chest, abdomen and pelvis was performed following the standard protocol during bolus administration of intravenous contrast. RADIATION DOSE REDUCTION: This exam was performed according to the departmental dose-optimization program which includes automated exposure  control, adjustment of the mA and/or kV according to patient size and/or use of iterative reconstruction technique. CONTRAST:  181m OMNIPAQUE IOHEXOL 300 MG/ML  SOLN COMPARISON:  No prior cross-sectional imaging for comparison. There is a portable chest from today and PA Lat chest from 04/10/2018, and a portable abdomen film from today FINDINGS: CT CHEST FINDINGS Cardiovascular: The cardiac size is normal. There is scattered three-vessel calcific CAD scattered calcific aortic atherosclerosis and 3.8 cm ectasia of the ascending segment. Remaining aorta is normal caliber without dissection or penetrating ulcer. The pulmonary veins are decompressed. Pulmonary arteries are normal caliber and centrally clear and there is no pericardial effusion. Mediastinum/Nodes: There is a small hiatal hernia and mild thickening in the mid to distal thoracic esophagus probably due to reflux or other esophagitis. Visualized thyroid gland is unremarkable. Axillary spaces are clear. There is subareolar gynecomastia both breasts. There is no intrathoracic adenopathy. The trachea is patent. Lungs/Pleura: No pleural effusion, thickening or pneumothorax. There are scattered scarring changes in the anterior lung bases. There are tiny calcified granulomas in the left upper and lower lobes. The lungs clear of infiltrates and nodules. The central airways are clear. 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 Mild thoracic kyphosis. No destructive bone lesion in the thorax is seen. CT ABDOMEN PELVIS FINDINGS Hepatobiliary: There is a tiny hypodensity in the dome of the right lobe of the liver on 8:42 which is too small to characterize. Liver is mildly steatotic and otherwise unremarkable. The gallbladder and bile ducts  are unremarkable. Pancreas: No focal abnormality or ductal dilatation. Spleen: No focal abnormality or splenomegaly. Adrenals/Urinary Tract: There is no adrenal mass, no  focal abnormality of the renal cortex, and no urinary stone or obstruction. There is mild bladder thickening versus underdistention. Stomach/Bowel: The stomach is contracted. The contrast fills the small bowel normally. There is no small bowel obstruction or inflammation. An appendix is not seen this patient. There is irregular circumferential wall thickening causing moderate to severe narrowing of the lumen of the cecum and proximal ascending colon over a 10 cm length of involvement, strongly concerning for infiltrating neoplasm. The upstream small bowel is not dilated but there is dilatation in the more distal ascending colon the transverse and descending colon up to 8 cm, with fluid levels. There is inflammatory stranding around the thickened cecum, small volume ascites in the perihepatic and bilateral pericolic gutter spaces. There is a large encircling mass of the proximal to mid sigmoid colon, effacing its lumen measuring up to 10 cm length, 5.8 cm transversely and 5.7 cm craniocaudal consistent with neoplasm and with no discernible lumen through it. More distal sigmoid and rectum are unremarkable. There are stranding changes around the sigmoid mass. Vascular/Lymphatic: Mild aortic atherosclerosis without aneurysm. There are scattered perisigmoid mesenteric lymph nodes, largest measuring 10 mm in short axis on 8:98. There are additional mesenteric nodes medial to the proximal ascending colon measuring up to 1.1 cm short axis. Periportal space lymph nodes are noted, with few of these up to 1.3 cm in short axis on 8:55 with borderline prominent portacaval space nodes. There is no bulky, encasing or further adenopathy. Reproductive: Prostate within normal size limits with central calcifications, mild impression on the posterior bladder. Seminal vesicles are unremarkable. Other: As above there is a small volume of perihepatic ascites, additional mild perisplenic ascites and bilateral mild paracolic gutter ascites,  with trace ascites in the posterior deep pelvis. There are small inguinal fat hernias. 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 There are degenerative changes of the lumbar spine. Anterior bridging osteophytes left SI joint. No destructive bone lesion. Acquired spinal stenosis L4-5. there is a 1.4 cm lucent lesion to the right within the L3 vertebral body, but there is trabecular coarsening within it and this is believed to be a hemangioma. IMPRESSION: 1. 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 There is no discernible lumen through this. 2. Additional irregular masslike wall thickening, apparently nonobstructing involves the cecum and proximal ascending colon as well, with additional adjacent stranding. There is no upstream distal ileal dilatation. 3. Small lymph nodes are seen medial to the proximal ascending colon and alongside the sigmoid mass but there is no bulky or encasing adenopathy. These lymph nodes are probably metastatic. Additional nonspecific slightly prominent periportal and borderline prominent portacaval space lymph nodes. 4. There is a tiny too small to characterize hypodensity in the hepatic dome which could be a cyst or an early metastasis. Liver mildly steatotic and otherwise unremarkable. 5. No other evidence of extracolonic disease spread is seen including in the chest. 6. Aortic and coronary artery atherosclerosis. 7. Mild thickening of the thoracic esophagus with small hiatal hernia. Probable reflux esophagitis. Other esophagitis is possible. 8. Cystitis versus bladder hypertrophy or nondistention. Mild impression  by the prostate gland on the bladder but the  prostate is not grossly enlarged. 9. Markedly advanced right hip DJD with remodeling and juxta-articular cysts, bulky acetabular osteophytes. 10. 1.4 cm lucent lesion in the L3 vertebral body, but with featu

## 2021-08-02 ENCOUNTER — Encounter (HOSPITAL_COMMUNITY): Payer: Self-pay | Admitting: Internal Medicine

## 2021-08-02 ENCOUNTER — Inpatient Hospital Stay (HOSPITAL_COMMUNITY): Payer: Medicare Other

## 2021-08-02 ENCOUNTER — Other Ambulatory Visit: Payer: Self-pay

## 2021-08-02 ENCOUNTER — Encounter (HOSPITAL_COMMUNITY)
Admission: AD | Disposition: A | Discharge status: Home Health | Payer: Self-pay | Source: Home / Self Care | Attending: Family Medicine

## 2021-08-02 ENCOUNTER — Inpatient Hospital Stay (HOSPITAL_COMMUNITY): Payer: Medicare Other | Admitting: Anesthesiology

## 2021-08-02 DIAGNOSIS — C180 Malignant neoplasm of cecum: Secondary | ICD-10-CM

## 2021-08-02 DIAGNOSIS — M1990 Unspecified osteoarthritis, unspecified site: Secondary | ICD-10-CM

## 2021-08-02 DIAGNOSIS — K56609 Unspecified intestinal obstruction, unspecified as to partial versus complete obstruction: Secondary | ICD-10-CM | POA: Diagnosis not present

## 2021-08-02 DIAGNOSIS — I10 Essential (primary) hypertension: Secondary | ICD-10-CM

## 2021-08-02 DIAGNOSIS — C187 Malignant neoplasm of sigmoid colon: Secondary | ICD-10-CM

## 2021-08-02 HISTORY — PX: COLON RESECTION: SHX5231

## 2021-08-02 HISTORY — PX: COLOSTOMY: SHX63

## 2021-08-02 LAB — CBC
HCT: 27.8 % — ABNORMAL LOW (ref 39.0–52.0)
Hemoglobin: 8.4 g/dL — ABNORMAL LOW (ref 13.0–17.0)
MCH: 28.2 pg (ref 26.0–34.0)
MCHC: 30.2 g/dL (ref 30.0–36.0)
MCV: 93.3 fL (ref 80.0–100.0)
Platelets: 683 10*3/uL — ABNORMAL HIGH (ref 150–400)
RBC: 2.98 MIL/uL — ABNORMAL LOW (ref 4.22–5.81)
RDW: 16.5 % — ABNORMAL HIGH (ref 11.5–15.5)
WBC: 10 10*3/uL (ref 4.0–10.5)
nRBC: 0 % (ref 0.0–0.2)

## 2021-08-02 LAB — SURGICAL PCR SCREEN
MRSA, PCR: NEGATIVE
Staphylococcus aureus: NEGATIVE

## 2021-08-02 LAB — BASIC METABOLIC PANEL
Anion gap: 6 (ref 5–15)
BUN: 11 mg/dL (ref 8–23)
CO2: 24 mmol/L (ref 22–32)
Calcium: 8 mg/dL — ABNORMAL LOW (ref 8.9–10.3)
Chloride: 104 mmol/L (ref 98–111)
Creatinine, Ser: 0.81 mg/dL (ref 0.61–1.24)
GFR, Estimated: >60 mL/min (ref >60)
Glucose, Bld: 120 mg/dL — ABNORMAL HIGH (ref 70–99)
Potassium: 3.8 mmol/L (ref 3.5–5.1)
Sodium: 134 mmol/L — ABNORMAL LOW (ref 135–145)

## 2021-08-02 LAB — GLUCOSE, CAPILLARY
Glucose-Capillary: 96 mg/dL (ref 70–99)
Glucose-Capillary: 108 mg/dL — ABNORMAL HIGH (ref 70–99)
Glucose-Capillary: 134 mg/dL — ABNORMAL HIGH (ref 70–99)

## 2021-08-02 LAB — ABO/RH: ABO/RH(D): O POS

## 2021-08-02 LAB — PREPARE RBC (CROSSMATCH)

## 2021-08-02 LAB — HEMOGLOBIN A1C
Hgb A1c MFr Bld: 5.8 % — ABNORMAL HIGH (ref 4.8–5.6)
Mean Plasma Glucose: 120 mg/dL

## 2021-08-02 SURGERY — COLON RESECTION
Anesthesia: General | Site: Abdomen | Laterality: Right

## 2021-08-02 MED ORDER — 0.9 % SODIUM CHLORIDE (POUR BTL) OPTIME
TOPICAL | Status: DC | PRN
  Administered 2021-08-02 (×6): 1000 mL

## 2021-08-02 MED ORDER — ACETAMINOPHEN 500 MG PO TABS
ORAL_TABLET | ORAL | Status: AC
  Filled 2021-08-02: qty 2

## 2021-08-02 MED ORDER — SODIUM CHLORIDE 0.9 % IV SOLN
INTRAVENOUS | Status: AC
  Filled 2021-08-02: qty 2

## 2021-08-02 MED ORDER — MEPERIDINE HCL 50 MG/ML IJ SOLN: 6.2500 mg | INTRAMUSCULAR | Status: DC | PRN

## 2021-08-02 MED ORDER — VASOPRESSIN 20 UNIT/ML IV SOLN
INTRAVENOUS | Status: AC
  Filled 2021-08-02: qty 1

## 2021-08-02 MED ORDER — SUGAMMADEX SODIUM 200 MG/2ML IV SOLN
INTRAVENOUS | Status: DC | PRN
  Administered 2021-08-02: 200 mg via INTRAVENOUS

## 2021-08-02 MED ORDER — ACETAMINOPHEN 10 MG/ML IV SOLN
INTRAVENOUS | Status: AC
  Filled 2021-08-02: qty 100

## 2021-08-02 MED ORDER — PROPOFOL 10 MG/ML IV BOLUS
INTRAVENOUS | Status: AC
  Filled 2021-08-02: qty 20

## 2021-08-02 MED ORDER — PHENYLEPHRINE 40 MCG/ML (10ML) SYRINGE FOR IV PUSH (FOR BLOOD PRESSURE SUPPORT)
PREFILLED_SYRINGE | INTRAVENOUS | Status: DC | PRN
  Administered 2021-08-02 (×3): 80 ug via INTRAVENOUS

## 2021-08-02 MED ORDER — SUCCINYLCHOLINE CHLORIDE 200 MG/10ML IV SOSY
PREFILLED_SYRINGE | INTRAVENOUS | Status: DC | PRN
  Administered 2021-08-02: 120 mg via INTRAVENOUS

## 2021-08-02 MED ORDER — LIDOCAINE 2% (20 MG/ML) 5 ML SYRINGE
INTRAMUSCULAR | Status: DC | PRN
Start: 2021-08-02 — End: 2021-08-02
  Administered 2021-08-02: 100 mg via INTRAVENOUS

## 2021-08-02 MED ORDER — CHLORHEXIDINE GLUCONATE CLOTH 2 % EX PADS
6.0000 | MEDICATED_PAD | Freq: Every day | CUTANEOUS | Status: DC
  Administered 2021-08-02 – 2021-08-09 (×8): 6 via TOPICAL

## 2021-08-02 MED ORDER — PROPOFOL 10 MG/ML IV BOLUS
INTRAVENOUS | Status: DC | PRN
  Administered 2021-08-02: 100 mg via INTRAVENOUS

## 2021-08-02 MED ORDER — MIDAZOLAM HCL 2 MG/2ML IJ SOLN
INTRAMUSCULAR | Status: AC
  Filled 2021-08-02: qty 2

## 2021-08-02 MED ORDER — FENTANYL CITRATE (PF) 100 MCG/2ML IJ SOLN
INTRAMUSCULAR | Status: AC
  Filled 2021-08-02: qty 2

## 2021-08-02 MED ORDER — PHENYLEPHRINE 40 MCG/ML (10ML) SYRINGE FOR IV PUSH (FOR BLOOD PRESSURE SUPPORT)
PREFILLED_SYRINGE | INTRAVENOUS | Status: AC
  Filled 2021-08-02: qty 30

## 2021-08-02 MED ORDER — HYDROMORPHONE HCL 1 MG/ML IJ SOLN: 0.2500 mg | INTRAMUSCULAR | Status: DC | PRN

## 2021-08-02 MED ORDER — POVIDONE-IODINE 10 % EX OINT
TOPICAL_OINTMENT | CUTANEOUS | Status: AC
  Filled 2021-08-02: qty 1

## 2021-08-02 MED ORDER — FENTANYL CITRATE (PF) 250 MCG/5ML IJ SOLN
INTRAMUSCULAR | Status: DC | PRN
  Administered 2021-08-02 (×3): 50 ug via INTRAVENOUS

## 2021-08-02 MED ORDER — BUPIVACAINE LIPOSOME 1.3 % IJ SUSP
INTRAMUSCULAR | Status: DC | PRN
  Administered 2021-08-02: 20 mL

## 2021-08-02 MED ORDER — FENTANYL CITRATE (PF) 250 MCG/5ML IJ SOLN
INTRAMUSCULAR | Status: AC
  Filled 2021-08-02: qty 5

## 2021-08-02 MED ORDER — ALVIMOPAN 12 MG PO CAPS
ORAL_CAPSULE | ORAL | Status: AC
Start: 2021-08-02 — End: 2021-08-02
  Filled 2021-08-02: qty 1

## 2021-08-02 MED ORDER — ACETAMINOPHEN 10 MG/ML IV SOLN
1000.0000 mg | Freq: Once | INTRAVENOUS | Status: AC
  Administered 2021-08-02: 1000 mg via INTRAVENOUS

## 2021-08-02 MED ORDER — SODIUM CHLORIDE 0.9 % IV SOLN
INTRAVENOUS | Status: DC | PRN
  Administered 2021-08-02: 2 g via INTRAVENOUS

## 2021-08-02 MED ORDER — ETOMIDATE 2 MG/ML IV SOLN
INTRAVENOUS | Status: AC
  Filled 2021-08-02: qty 10

## 2021-08-02 MED ORDER — ROCURONIUM BROMIDE 10 MG/ML (PF) SYRINGE
PREFILLED_SYRINGE | INTRAVENOUS | Status: DC | PRN
  Administered 2021-08-02: 50 mg via INTRAVENOUS
  Administered 2021-08-02: 30 mg via INTRAVENOUS
  Administered 2021-08-02: 20 mg via INTRAVENOUS

## 2021-08-02 MED ORDER — LACTATED RINGERS IV SOLN: INTRAVENOUS | Status: DC

## 2021-08-02 MED ORDER — BUPIVACAINE LIPOSOME 1.3 % IJ SUSP
INTRAMUSCULAR | Status: AC
  Filled 2021-08-02: qty 20

## 2021-08-02 MED ORDER — LACTATED RINGERS IV BOLUS
1000.0000 mL | Freq: Once | INTRAVENOUS | Status: AC
  Administered 2021-08-02: 1000 mL via INTRAVENOUS

## 2021-08-02 MED ORDER — DEXAMETHASONE SODIUM PHOSPHATE 10 MG/ML IJ SOLN
INTRAMUSCULAR | Status: AC
  Filled 2021-08-02: qty 2

## 2021-08-02 MED ORDER — ROCURONIUM BROMIDE 10 MG/ML (PF) SYRINGE
PREFILLED_SYRINGE | INTRAVENOUS | Status: AC
  Filled 2021-08-02: qty 10

## 2021-08-02 MED ORDER — PHENOL 1.4 % MT LIQD
1.0000 | OROMUCOSAL | Status: DC | PRN
  Administered 2021-08-02: 1 via OROMUCOSAL
  Filled 2021-08-02: qty 177

## 2021-08-02 MED ORDER — ONDANSETRON HCL 4 MG/2ML IJ SOLN
INTRAMUSCULAR | Status: DC | PRN
Start: 2021-08-02 — End: 2021-08-02
  Administered 2021-08-02: 4 mg via INTRAVENOUS

## 2021-08-02 MED ORDER — ORAL CARE MOUTH RINSE: 15.0000 mL | Freq: Once | OROMUCOSAL | Status: DC

## 2021-08-02 MED ORDER — CHLORHEXIDINE GLUCONATE 0.12 % MT SOLN: 15.0000 mL | Freq: Once | OROMUCOSAL | Status: DC

## 2021-08-02 MED ORDER — LIDOCAINE HCL (PF) 2 % IJ SOLN
INTRAMUSCULAR | Status: AC
  Filled 2021-08-02: qty 5

## 2021-08-02 MED ORDER — DEXAMETHASONE SODIUM PHOSPHATE 4 MG/ML IJ SOLN
INTRAMUSCULAR | Status: DC | PRN
Start: 2021-08-02 — End: 2021-08-02
  Administered 2021-08-02: 10 mg via INTRAVENOUS

## 2021-08-02 MED ORDER — PHENYLEPHRINE HCL-NACL 20-0.9 MG/250ML-% IV SOLN
INTRAVENOUS | Status: DC | PRN
  Administered 2021-08-02: 50 ug/min via INTRAVENOUS

## 2021-08-02 MED ORDER — SUCCINYLCHOLINE CHLORIDE 200 MG/10ML IV SOSY
PREFILLED_SYRINGE | INTRAVENOUS | Status: AC
  Filled 2021-08-02: qty 10

## 2021-08-02 SURGICAL SUPPLY — 52 items
BAG HAMPER (MISCELLANEOUS) ×4 IMPLANT
BARRIER SKIN 2 1/4 (WOUND CARE) ×4 IMPLANT
BARRIER SKIN OD1.75 2 1/4 FLNG (WOUND CARE) IMPLANT
BENZOIN TINCTURE PRP APPL 2/3 (GAUZE/BANDAGES/DRESSINGS) ×2 IMPLANT
COVER LIGHT HANDLE STERIS (MISCELLANEOUS) ×16 IMPLANT
DRSG OPSITE POSTOP 4X8 (GAUZE/BANDAGES/DRESSINGS) ×2 IMPLANT
ELECT REM PT RETURN 9FT ADLT (ELECTROSURGICAL) ×4
ELECTRODE REM PT RTRN 9FT ADLT (ELECTROSURGICAL) ×3 IMPLANT
GAUZE 4X4 16PLY ~~LOC~~+RFID DBL (SPONGE) ×4 IMPLANT
GAUZE SPONGE 4X4 12PLY STRL (GAUZE/BANDAGES/DRESSINGS) ×2 IMPLANT
GLOVE BIO SURGEON STRL SZ 6.5 (GLOVE) ×6 IMPLANT
GLOVE BIOGEL PI IND STRL 6.5 (GLOVE) ×3 IMPLANT
GLOVE BIOGEL PI IND STRL 7.0 (GLOVE) ×2 IMPLANT
GLOVE BIOGEL PI INDICATOR 6.5 (GLOVE) ×3
GLOVE BIOGEL PI INDICATOR 7.0 (GLOVE) ×2
GLOVE ECLIPSE 7.0 STRL STRAW (GLOVE) ×4 IMPLANT
GLOVE SURG LTX SZ6.5 (GLOVE) ×4 IMPLANT
GLOVE SURG POLYISO LF SZ6.5 (GLOVE) ×8 IMPLANT
GLOVE SURG POLYISO LF SZ7 (GLOVE) ×4 IMPLANT
GLOVE SURG UNDER POLY LF SZ7 (GLOVE) ×28 IMPLANT
GOWN STRL REUS W/TWL LRG LVL3 (GOWN DISPOSABLE) ×24 IMPLANT
INST SET MAJOR GENERAL (KITS) ×4 IMPLANT
KIT BLADEGUARD II DBL (SET/KITS/TRAYS/PACK) ×4 IMPLANT
KIT TURNOVER KIT A (KITS) ×4 IMPLANT
LIGASURE IMPACT 36 18CM CVD LR (INSTRUMENTS) ×4 IMPLANT
MANIFOLD NEPTUNE II (INSTRUMENTS) ×4 IMPLANT
NDL HYPO 18GX1.5 BLUNT FILL (NEEDLE) ×2 IMPLANT
NDL HYPO 21X1.5 SAFETY (NEEDLE) ×2 IMPLANT
NEEDLE HYPO 18GX1.5 BLUNT FILL (NEEDLE) ×4 IMPLANT
NEEDLE HYPO 21X1.5 SAFETY (NEEDLE) ×4 IMPLANT
NS IRRIG 1000ML POUR BTL (IV SOLUTION) ×16 IMPLANT
PACK COLON (CUSTOM PROCEDURE TRAY) ×4 IMPLANT
PAD ARMBOARD 7.5X6 YLW CONV (MISCELLANEOUS) ×4 IMPLANT
PENCIL SMOKE EVACUATOR COATED (MISCELLANEOUS) ×4 IMPLANT
POUCH OSTOMY 2 PC DRNBL 2.25 (WOUND CARE) ×1 IMPLANT
POUCH OSTOMY DRNBL 2 1/4 (WOUND CARE) ×1
RELOAD PROXIMATE 75MM BLUE (ENDOMECHANICALS) ×8 IMPLANT
RELOAD STAPLE 75 3.8 BLU REG (ENDOMECHANICALS) IMPLANT
RETRACTOR WND ALEXIS-O 25 LRG (MISCELLANEOUS) ×1 IMPLANT
RTRCTR WOUND ALEXIS O 25CM LRG (MISCELLANEOUS) ×4
SHEET LAVH (DRAPES) ×2 IMPLANT
SPONGE T-LAP 18X18 ~~LOC~~+RFID (SPONGE) ×22 IMPLANT
STAPLER CVD CUT GN 40 RELOAD (ENDOMECHANICALS) ×4 IMPLANT
STAPLER CVD CUT GRN 40 RELOAD (ENDOMECHANICALS) IMPLANT
STAPLER PROXIMATE 75MM BLUE (STAPLE) ×2 IMPLANT
STAPLER VISISTAT (STAPLE) ×4 IMPLANT
SUT CHROMIC 3 0 SH 27 (SUTURE) ×8 IMPLANT
SUT PDS AB CT VIOLET #0 27IN (SUTURE) ×8 IMPLANT
SUT SILK 3 0 SH CR/8 (SUTURE) ×4 IMPLANT
SYR 20ML LL LF (SYRINGE) ×4 IMPLANT
TRAY FOLEY MTR SLVR 16FR STAT (SET/KITS/TRAYS/PACK) ×4 IMPLANT
YANKAUER SUCT BULB TIP 10FT TU (MISCELLANEOUS) ×4 IMPLANT

## 2021-08-02 NOTE — Progress Notes (Signed)
Patient remains with hiccups. Patient still c/o abd pain discomfort. However patient has not been drinking the miralax, scheduled last 2 scheduled packets patient did not drink, he did drink the pre surgery ensure. Writer explained importance of cleaning out his bowels. NS currently running at 81m/hr. MRSA negative. ?

## 2021-08-02 NOTE — Progress Notes (Addendum)
[facility] ? ?Updated family and orders placed for stepdown. Updated team.  ? ?PRN for pain ?IS, OOB ?Soft BP, may need more fluid or blood ?NPO with sips and meds ?Awaiting ileostomy function ?Ileostomy RN ordered/ supplies ?Labs in AM ?SCDs, heparin sq ?If has lower BP may need repeat H&H and blood, has 2 more units available. ? ?Curlene Labrum, MD ?[facility] ?[place], [place] [address] ?[phone] (office) ? ?

## 2021-08-02 NOTE — Anesthesia Postprocedure Evaluation (Signed)
Anesthesia Post Note ? ?Patient: Todd Richardson ? ?Procedure(s) Performed: En bloc Resection of Colon and Small bowel (Abdomen) ?COLOSTOMY (Right: Abdomen) ? ?Patient location during evaluation: ICU ?Anesthesia Type: General ?Level of consciousness: awake and alert and oriented ?Pain management: pain level controlled ?Vital Signs Assessment: post-procedure vital signs reviewed and stable ?Respiratory status: spontaneous breathing, nonlabored ventilation and respiratory function stable ?Cardiovascular status: unstable (low blood pressure, discussed with ICU nurse and advised to call Dr. Loni Beckwith) ?Postop Assessment: no apparent nausea or vomiting ?Anesthetic complications: no ?Comments: low blood pressure, discussed with ICU nurse and advised to call Dr. Loni Beckwith ? ? ?No notable events documented. ? ? ?Last Vitals:  ?Vitals:  ? 08/02/21 1825 08/02/21 1846  ?BP: (!) 82/50 (!) 89/62  ?Pulse: (!) 103 (!) 107  ?Resp:  (!) 8  ?Temp: 36.6 ?C   ?SpO2: 100% 100%  ?  ?Last Pain:  ?Vitals:  ? 08/02/21 1852  ?TempSrc:   ?PainSc: 5   ? ? ?  ?  ?  ?  ?  ?  ? ?Harvis Mabus C Jameila Keeny ? ? ? ? ?

## 2021-08-02 NOTE — Anesthesia Procedure Notes (Signed)
Procedure Name: Intubation ?Date/Time: 08/02/2021 1:37 PM ?Performed by: Myna Bright, CRNA ?Pre-anesthesia Checklist: Patient identified, Emergency Drugs available, Suction available and Patient being monitored ?Patient Re-evaluated:Patient Re-evaluated prior to induction ?Oxygen Delivery Method: Circle system utilized ?Preoxygenation: Pre-oxygenation with 100% oxygen ?Induction Type: IV induction, Rapid sequence and Cricoid Pressure applied ?Laryngoscope Size: Mac and 4 ?Grade View: Grade II ?Tube type: Oral ?Tube size: 7.5 mm ?Number of attempts: 1 ?Airway Equipment and Method: Stylet ?Placement Confirmation: ETT inserted through vocal cords under direct vision, positive ETCO2 and breath sounds checked- equal and bilateral ?Secured at: 22 cm ?Tube secured with: Tape ?Dental Injury: Teeth and Oropharynx as per pre-operative assessment  ? ? ? ? ?

## 2021-08-02 NOTE — Transfer of Care (Signed)
Immediate Anesthesia Transfer of Care Note ? ?Patient: Todd Richardson ? ?Procedure(s) Performed: En bloc Resection of Colon and Small bowel (Abdomen) ? ?Patient Location: PACU ? ?Anesthesia Type:General ? ?Level of Consciousness: awake, alert , oriented and patient cooperative ? ?Airway & Oxygen Therapy: Patient Spontanous Breathing and Patient connected to face mask oxygen ? ?Post-op Assessment: Report given to RN, Post -op Vital signs reviewed and stable and Patient moving all extremities ? ?Post vital signs: Reviewed and stable ? ?Last Vitals:  ?Vitals Value Taken Time  ?BP    ?Temp    ?Pulse 141 08/02/21 1634  ?Resp 18 08/02/21 1634  ?SpO2 100 % 08/02/21 1634  ?Vitals shown include unvalidated device data. ? ?Last Pain:  ?Vitals:  ? 08/02/21 1113  ?TempSrc:   ?PainSc: 0-No pain  ?   ? ?Patients Stated Pain Goal: 0 (08/02/21 0208) ? ?Complications: No notable events documented. ?

## 2021-08-02 NOTE — Interval H&P Note (Signed)
History and Physical Interval Note: ? ?08/02/2021 ?1:12 PM ? ?Todd Richardson  has presented today for surgery, with the diagnosis of colon cancer.  The various methods of treatment have been discussed with the patient and family. After consideration of risks, benefits and other options for treatment, the patient has consented to  Procedure(s): ?PARTIAL COLECTOMY; possible ileostomy (N/A) as a surgical intervention.  The patient's history has been reviewed, patient examined, no change in status, stable for surgery.  I have reviewed the patient's chart and labs.  Questions were answered to the patient's satisfaction.   ? ? ?Virl Cagey ? ? ?

## 2021-08-02 NOTE — Op Note (Signed)
[facility] ?Operative Note ? ?08/02/21 ? ?Preoperative Diagnosis:  Sigmoid colon cancer, cecal mass, large bowel obstruction ?  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  ?Procedure(s) Performed: Subtotal colectomy with en bloc resection of terminal ileum, ileostomy creation ?  ?Surgeon: Lanell Matar. Constance Haw, MD ?  ?Assistants: Aviva Signs, MD  ?  ?Anesthesia: General endotracheal ?  ?Anesthesiologist: Dr. Charna Elizabeth  ?  ?Specimens: Colon and terminal ileum  ?  ?Estimated Blood Loss: 600 ?  ?Blood Replacement: 2 U pRBC  ?  ?Complications: None   ? ?Wound Class: Clean contaminated  ?  ?Operative Indications: Mr. Weant with a sigmoid colon cancer that was obstructing and a second mass in the cecum noted on CT scan. We discussed subtotal colectomy and possible ileostomy. We discussed the risk of bleeding, infection, anastomotic leak, possible ostomy, possible injury to other bowel or ureters.  ? ?Findings: Sigmoid cancer with extensive through wall, cecal mass with adherent small bowel removed en bloc ?  ?Procedure: The patient was taken to the operating room and placed supine. General endotracheal anesthesia was induced. Intravenous antibiotics were  administered per protocol.  A nasogastric tube had been in place to decompress the stomach prior to intubation. A foley was placed. He was then placed in lithotomy with all pressure points padded.  The abdomen and perineum were prepared and draped in the usual sterile fashion.  ? ?A midline incision was made and carried down to the fascia. Care was taken to enter. The fascia was opened and a wound proctor was placed. The colon was very dilated as expected. There was a hardened mass in the sigmoid colon and cecum. The cecum had loops of small bowel adherent to it and it was difficult to determine which was actually the terminal ileum as they appeared fuse.  I took down the white line of the cecum and ascending colon with cautery and  carried this up to the hepatic flexure and took the hepatic flexure down with the ligasure.  From here I could sweep the cecum and ascending colon out. I came across a loop of small bowel I thought was the terminal ileum with a 75 mm linear cutting stapler, and continued to  eviscerate the cecum out of the right lower quadrant. I noted two more loops of small bowel, and came across these with two 75 mm linear cutting staplers, finally freeing up the true terminal ileum. The mesentery was scored and taken with a ligasure as close to the bifurcation as I could get.  The colon was then eviscerated out and the transverse colon was eviscerated out, freeing it from the omentum, and taking its mesentery with the Ligasure, protecting the duodenum.  ? ?Once this was all freed I could tell that the terminal ileum had looped up and fused to the cecum/ cecal mass, and was removed with the cecum in addition to an additional piece of ileum that was distal just proximal to the terminal ileum. The splenic flexure was then taken down and the white line on the descending colon was taken down and the mesentery was taken with the Ligasure. Both ureters were noted and protected. The sigmoid colon was hard and adherent to the peritoneal reflection over the bladder. This was taken with care. The bladder was not entered but the peritoneal reflection was disrupted.  The sigmoid colon was then freed up on each side in the pelvis using cautery, and the distal transection point distal to the tattoo from  the colonoscopy was taken with the TA contour 60 stapler.  The remaining mesentery was taken with the Ligasure.   ? ?Both ureters again were noted and protected. The specimen was removed and sent en bloc.  The abdomen was irrigated. The patient at this point had lost 600 cc of blood from generalized oozing and was receiving blood and was a small amount of pressures. The stump was shorter than 8cm. I opted to do an ileostomy. ? ?Hemostasis was  achieved. The bladder was filled with saline and no leak was noted. The peritoneal reflection was oversewn with 2-0 Vicryl interrupted. The remaining small bowel was ran and was noted to be in continuity and had no injury.  ? ?The entire team then changed gown and gloves and new equipment was used. The right lower quadrant as inspected and the location of the ileostomy was chosen. The skin was excised and the subcutaneous tissue. The fascia was cauterized in a cruciate fashion and the muscle fibers were dissected out. The ileostomy site fit two fingers through and the end ileum was brought through without any twisting.  ? ?The midline was closed with 0 PDS in the standard fashion. The honeycomb was applied. The ileostomy was matured with an inferior Brook suture using 3-0 Chromic gut. The ileostomy appliance was applied.  ? ?Dr. Arnoldo Morale was assisting throughout the procedure and was present for the critical portions of the case.  ? ?Final inspection revealed acceptable hemostasis. All counts were correct at the end of the case. The patient was awakened from anesthesia and extubated without complication.  The patient went to the PACU in stable condition. ?  ?Curlene Labrum, MD ?[facility] ?[place], [place] [address] ?[phone] (office) ? ? ?

## 2021-08-02 NOTE — Anesthesia Preprocedure Evaluation (Addendum)
Anesthesia Evaluation  ?Patient identified by MRN, date of birth, ID band ?Patient awake ? ? ? ?Reviewed: ?Allergy & Precautions, NPO status , Patient's Chart, lab work & pertinent test results ? ?Airway ?Mallampati: II ? ?TM Distance: >3 FB ?Neck ROM: Full ? ? ? Dental ? ?(+) Dental Advisory Given, Missing, Poor Dentition,  ?  ?Pulmonary ?asthma ,  ?  ?Pulmonary exam normal ?breath sounds clear to auscultation ? ? ? ? ? ? Cardiovascular ?Exercise Tolerance: Good ?hypertension, Pt. on medications ?Normal cardiovascular exam ?Rhythm:Regular Rate:Normal ? ? ?  ?Neuro/Psych ?negative neurological ROS ? negative psych ROS  ? GI/Hepatic ?Neg liver ROS, Bowel prep,Sigmoid colon mass, large bowel obstruction ?  ?Endo/Other  ?negative endocrine ROS ? Renal/GU ?negative Renal ROS  ?negative genitourinary ?  ?Musculoskeletal ? ?(+) Arthritis , Osteoarthritis,   ? Abdominal ?  ?Peds ?negative pediatric ROS ?(+)  Hematology ?negative hematology ROS ?(+)   ?Anesthesia Other Findings ?EXAM: ?PORTABLE ABDOMEN - 1 VIEW ?? ?COMPARISON:  July 31, 2021. ?? ?FINDINGS: ?Distal tip of nasogastric tube is seen in expected position of ?distal stomach. Stable air-filled dilatation of transverse colon is ?noted. Severe degenerative change in right hip is noted. ?? ?IMPRESSION: ?Distal tip of nasogastric tube seen in expected position of distal ?stomach. ?? ?? ?Electronically Signed ?  By: Marijo Conception M.D. ?  On: 08/02/2021 09:01 ? Reproductive/Obstetrics ?negative OB ROS ? ?  ? ? ? ? ? ? ? ? ? ? ? ? ? ?  ?  ? ? ? ? ? ? ? ?Anesthesia Physical ?Anesthesia Plan ? ?ASA: 3 ? ?Anesthesia Plan: General  ? ?Post-op Pain Management: Dilaudid IV  ? ?Induction: Intravenous, Rapid sequence and Cricoid pressure planned ? ?PONV Risk Score and Plan: 3 and Ondansetron and Dexamethasone ? ?Airway Management Planned: Oral ETT ? ?Additional Equipment:  ? ?Intra-op Plan:  ? ?Post-operative Plan: Extubation in OR and  Possible Post-op intubation/ventilation ? ?Informed Consent: I have reviewed the patients History and Physical, chart, labs and discussed the procedure including the risks, benefits and alternatives for the proposed anesthesia with the patient or authorized representative who has indicated his/her understanding and acceptance.  ? ? ? ?Dental advisory given ? ?Plan Discussed with: CRNA and Surgeon ? ?Anesthesia Plan Comments:   ? ? ? ? ? ?Anesthesia Quick Evaluation                                  Anesthesia Evaluation  ?Patient identified by MRN, date of birth, ID band ?Patient awake ? ? ? ?Reviewed: ?Allergy & Precautions, NPO status , Patient's Chart, lab work & pertinent test results ? ?Airway ?Mallampati: II ? ?TM Distance: >3 FB ?Neck ROM: Full ? ? ? Dental ? ?(+) Dental Advisory Given, Missing, Poor Dentition, Loose, Chipped,  ?  ?Pulmonary ?asthma ,  ?  ?Pulmonary exam normal ?breath sounds clear to auscultation ? ? ? ? ? ? Cardiovascular ?Exercise Tolerance: Good ?hypertension, Pt. on medications ?Normal cardiovascular exam ?Rhythm:Regular Rate:Normal ? ? ?  ?Neuro/Psych ?negative neurological ROS ? negative psych ROS  ? GI/Hepatic ?negative GI ROS, Neg liver ROS,   ?Endo/Other  ?negative endocrine ROS ? Renal/GU ?negative Renal ROS  ?negative genitourinary ?  ?Musculoskeletal ? ?(+) Arthritis , Osteoarthritis,   ? Abdominal ?  ?Peds ?negative pediatric ROS ?(+)  Hematology ?negative hematology ROS ?(+)   ?Anesthesia Other Findings ? ? Reproductive/Obstetrics ?negative OB ROS ? ?  ? ? ? ? ? ? ? ? ? ? ? ? ? ?  ?  ? ? ? ? ? ? ? ?  Anesthesia Physical ?Anesthesia Plan ? ?ASA: 2 ? ?Anesthesia Plan: General  ? ?Post-op Pain Management: Minimal or no pain anticipated  ? ?Induction: Intravenous ? ?PONV Risk Score and Plan: TIVA and Propofol infusion ? ?Airway Management Planned: Nasal Cannula and Natural Airway ? ?Additional Equipment:  ? ?Intra-op Plan:  ? ?Post-operative Plan:  ? ?Informed Consent: I have reviewed  the patients History and Physical, chart, labs and discussed the procedure including the risks, benefits and alternatives for the proposed anesthesia with the patient or authorized representative who has indicated his/her understanding and acceptance.  ? ? ? ?Dental advisory given ? ?Plan Discussed with: CRNA and Surgeon ? ?Anesthesia Plan Comments:   ? ? ? ? ? ?Anesthesia Quick Evaluation ? ? ?

## 2021-08-02 NOTE — Progress Notes (Signed)
?PROGRESS NOTE ? ? ? ? ?Todd Richardson, is a 66 y.o. male, DOB - 1955/07/09, [id number] ? ?Admit date - 07/31/2021   Admitting Physician Todd Dubois, MD ? ?Outpatient Primary MD for the patient is Todd Douglas, MD ? ?LOS - 2 ? ?CC -Abd pain ?    ? ? ?Brief Narrative:  ? ?66 y.o. male with medical history significant of hypertension, asthma  and fam h/o Colon cancer in his father admitted on 07/31/21 with abdominal pain and bowel obstruction with concerns for cecal and sigmoid malignancy ? ?  ?-Assessment and Plan: ?* Large bowel obstruction due to cecal and sigmoid mass ?-In the setting of almost near complete obstruction from colon mass ?-High concerns for malignancy--especially given family history of colon cancer in his father and lack of prior screening colonoscopy until now ?-Colonoscopy on 07/31/2021 with obstructing colon mass (A frond-like/villous, fungating, infiltrative and polypoid nearly completely obstructing mass was found in the sigmoid colon approx 15 cm from the anal verge. The mass was circumferential. The mass measured five cm in length. Oozing was present. 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 ?Thrombocytosis ?--Could be reactive ? ?Hyponatremia ?--Suspect dehydration related ?-Given IV fluids pending return of bowel function and tolerance of oral intake ? ?Leukocytosis ?-Most likely a stress demargination and dehydration/reactive ?-Currently no frank/specific source of infection identified ?-Holding on antibiotics. ? ?Hyperglycemia ?-No prior history of diabetes ?-Check A1c ? ?Hypokalemia ?-Due to GI losses, replaced ? ?HTN (hypertension) ?- Blood pressure overall stable ?-In the setting of hypokalemia and  dehydration we will hold lisinopril/HCTZ ?- ?Social/ethics--- plan of care and advanced directives discussed with patient, his brother Todd Richardson present at bedside ?-Patient is a full ? ?Acute anemia--- suspect this is related to underlying GI malignancy, hemoglobin currently above 8  ?--monitor and transfuse as clinically indicated ? ?Disposition/Need for in-Hospital Stay- patient unable to be discharged at this time due to --- bowel obstruction concerns for colon malignancy, requiring IV fluids pending return of bowel function and tolerance of oral intake, as well as surgical intervention ? ?Status is: Inpatient  ? ?Disposition: The patient is from: Home ?             Anticipated d/c is to: Home ?             Anticipated d/c date is: > 3 days ?             Patient currently is not medically stable to d/c. 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 ?Code Status :  -  Code Status: Full Code  ? ?Family Communication:   (patient is alert, awake and coherent)  ?-Discussed with his brother Todd Richardson) at bedside ? ?DVT Prophylaxis  :   - SCDs  SCD's Start: 08/01/21 0938 ?heparin injection 5,000 Units Start: 07/31/21 1800 ? ? ?Lab Results  ?Component Value Date  ? PLT 683 (H) 08/02/2021  ? ?Inpatient Medications ? ?Scheduled Meds: ? acetaminophen      ? alvimopan      ? chlorhexidine  15 mL Mouth/Throat Once  ? Or  ? mouth rinse  15 mL Mouth  Rinse Once  ? [MAR Hold] heparin  5,000 Units Subcutaneous Q8H  ? [MAR Hold] polyethylene glycol  17 g Oral Q4H  ? ?Continuous Infusions: ? sodium chloride Stopped (08/02/21 1535)  ? lactated ringers 50 mL/hr at 08/02/21 1324  ? cefoTEtan (CEFOTAN) 2 GM IVPB (Mini-Bag Plus)    ? ?PRN Meds:.0.9 % irrigation (POUR BTL), [MAR Hold] acetaminophen **OR** [MAR Hold] acetaminophen, bupivacaine liposome, HYDROmorphone (DILAUDID) injection, [MAR Hold]  HYDROmorphone  (DILAUDID) injection, [MAR Hold] ipratropium-albuterol, meperidine (DEMEROL) injection, [MAR Hold] ondansetron **OR** [MAR Hold] ondansetron (ZOFRAN) IV, [MAR Hold] phenol ? ? ?Anti-infectives (From admission, onward)  ? ? Start     Dose/Rate Route Frequency Ordered Stop  ? 08/02/21 1126  sodium chloride 0.9 % with cefoTEtan (CEFOTAN) ADS Med       ?Note to Pharmacy: Jerrye Beavers: cabinet override  ?    08/02/21 1126 08/02/21 2329  ? 08/01/21 1400  neomycin (MYCIFRADIN) tablet 1,000 mg       ?See Hyperspace for full Linked Orders Report.  ? 1,000 mg Oral 3 times per day 08/01/21 0937 08/01/21 2121  ? 08/01/21 1400  metroNIDAZOLE (FLAGYL) tablet 1,000 mg       ?See Hyperspace for full Linked Orders Report.  ? 1,000 mg Oral 3 times per day 08/01/21 0937 08/01/21 2121  ? 08/01/21 1030  cefoTEtan (CEFOTAN) 2 g in sodium chloride 0.9 % 100 mL IVPB       ? 2 g ?200 mL/hr over 30 Minutes Intravenous On call to O.R. 08/01/21 6203 08/02/21 0559  ? ?  ? ?Subjective: ?Todd Richardson today has no fevers,No chest pain,   ? 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 ?Objective: ?Vitals:  ? 08/02/21 1113 08/02/21 1200 08/02/21 1215 08/02/21 1220  ?BP: (!) 130/100     ?Pulse: (!) 104 (!) 118 (!) 128 (!) 109  ?Resp: 18 (!) '21 16 12  '$ ?Temp: 98.9 ?F (37.2 ?C)     ?TempSrc:      ?SpO2: 99% 98% 95% 98%  ?Weight:      ?Height:      ? ? ?Intake/Output Summary (Last 24 hours) at 08/02/2021 1625 ?Last data filed at 08/02/2021 1614 ?Gross per 24 hour  ?Intake 4407.65 ml  ?Output 801 ml  ?Net 3606.65 ml  ? ?Filed Weights  ? 07/31/21 1001  ?Weight: 72.1 kg  ? ? ?Physical Exam ? ?Gen:-Sleepy,  ?HEENT:- [place].AT, No sclera icterus ?Neck-Supple Neck,No JVD,.  ?Lungs-slightly diminished breath sounds, no wheezing  ?CV- S1, S2 normal, regular  ?Abd-appropriate postop tenderness,  ileostomy  ?extremity/Skin:- No  edema, pedal pulses present  ?Psych-sleepy post op  ?Neuro-generalized weakness, no new focal deficits, no tremors ? ?Data Reviewed: I have personally reviewed following labs and imaging studies ? ?CBC: ?Recent Labs  ?Lab 07/31/21 ?1547 08/01/21 ?0454 08/02/21 ?0505  ?WBC 14.6* 10.9* 10.0  ?HGB 9.8* 8.7* 8.4*  ?HCT 30.4* 27.2* 27.8*  ?MCV 91.8 92.2 93.3  ?PLT 799* 711* 683*  ? ?Basic Metabolic Panel: ?Recent Labs  ?Lab 07/27/21 ?0958 07/31/21 ?1547 08/01/21 ?0454 08/02/21 ?0505  ?NA 134* 133* 132* 134*  ?K 3.0* 3.1* 3.7 3.8  ?CL 102 104 102 104  ?CO2 '25 23 24 24  '$ ?GLUCOSE 116* 151* 129* 120*  ?BUN '12 13 12 11  '$ ?CREATININE 0.88 0.73 0.76 0.81  ?CALCIUM  8.0* 8.2* 8.1* 8.0*  ?MG  --  2.3 2.5*  --   ?PHOS  --  3.8  --   --   ? ?GFR: ?Estimated Creatinine Clearance: 89.1 mL/min (by C-G formula based on SCr of 0.81 mg/dL). ?Liver Function Tests: ?Recent Labs  ?Lab 07/31/21 ?1547  ?AST 20  ?ALT 15  ?ALKPHOS 83  ?BILITOT 0.5  ?PROT 5.5*  ?ALBUMIN 2.3*  ? ?Cardiac Enzymes: ?No results for input(s): CKTOTAL, CKMB, CKMBINDEX, TROPONINI in the last 168 hours. ?BNP (last 3 results) ?No results for input(s): PROBNP in the last 8760 hours. ?HbA1C: ?Recent Labs  ?  07/31/21 ?1547 08/01/21 ?0949  ?HGBA1C 5.7* 5.8*  ? ?Sepsis Labs: ?'@LABRCNTIP'$ (procalcitonin:4,lacticidven:4) ?) ?Recent Results (from the past 240 hour(s))  ?Surgical PCR screen     Status: None  ? Collection Time: 08/02/21 12:48 AM  ? Specimen: Nasal Mucosa; Nasal Swab  ?Result Value Ref Range Status  ? MRSA, PCR NEGATIVE NEGATIVE Final  ? Staphylococcus aureus NEGATIVE NEGATIVE Final  ?  Comment: (NOTE) ?The Xpert SA Assay (FDA approved for NASAL specimens in patients 84 ?years of age and older), is one component of a comprehensive ?surveillance program. It is not intended to diagnose infection nor to ?guide or monitor treatment. ?Performed at [facility], [address] [place], [place] [address] ?  ?  ? ? ?Radiology Studies: ?CT  CHEST W CONTRAST ? ?Result Date: 07/31/2021 ?CLINICAL DATA:  This is a cancer staging workup. There is a large sigmoid mass obstructing the large intestine. Evaluate for metastasis and/or adenopathy. EXAM: CT CHEST, A

## 2021-08-02 NOTE — Progress Notes (Signed)
Fleets enema administered per order, tolerated well and immediate response with some bowel noted. NG tube 16Fr inserted with Deirdre Pippins, RN. Pt tolerated well, however c/o uncomfortable feeling in his throat. NG hooked up to intermittent low suction with yellow bile residual. STAT abd xray ordered to check placement. Administered pain medication to promote comfort and for the pt to relax.  ?

## 2021-08-03 ENCOUNTER — Encounter (HOSPITAL_COMMUNITY): Payer: Self-pay | Admitting: Internal Medicine

## 2021-08-03 DIAGNOSIS — K56609 Unspecified intestinal obstruction, unspecified as to partial versus complete obstruction: Secondary | ICD-10-CM | POA: Diagnosis not present

## 2021-08-03 LAB — CBC
HCT: 30.3 % — ABNORMAL LOW (ref 39.0–52.0)
Hemoglobin: 9.9 g/dL — ABNORMAL LOW (ref 13.0–17.0)
MCH: 30.3 pg (ref 26.0–34.0)
MCHC: 32.7 g/dL (ref 30.0–36.0)
MCV: 92.7 fL (ref 80.0–100.0)
Platelets: 536 10*3/uL — ABNORMAL HIGH (ref 150–400)
RBC: 3.27 MIL/uL — ABNORMAL LOW (ref 4.22–5.81)
RDW: 15.6 % — ABNORMAL HIGH (ref 11.5–15.5)
WBC: 16.2 10*3/uL — ABNORMAL HIGH (ref 4.0–10.5)
nRBC: 0 % (ref 0.0–0.2)

## 2021-08-03 LAB — RENAL FUNCTION PANEL
Albumin: 1.5 g/dL — ABNORMAL LOW (ref 3.5–5.0)
Anion gap: 7 (ref 5–15)
BUN: 14 mg/dL (ref 8–23)
CO2: 23 mmol/L (ref 22–32)
Calcium: 7.6 mg/dL — ABNORMAL LOW (ref 8.9–10.3)
Chloride: 106 mmol/L (ref 98–111)
Creatinine, Ser: 1.19 mg/dL (ref 0.61–1.24)
GFR, Estimated: >60 mL/min (ref >60)
Glucose, Bld: 177 mg/dL — ABNORMAL HIGH (ref 70–99)
Phosphorus: 5 mg/dL — ABNORMAL HIGH (ref 2.5–4.6)
Potassium: 4.3 mmol/L (ref 3.5–5.1)
Sodium: 136 mmol/L (ref 135–145)

## 2021-08-03 LAB — GLUCOSE, CAPILLARY
Glucose-Capillary: 151 mg/dL — ABNORMAL HIGH (ref 70–99)
Glucose-Capillary: 152 mg/dL — ABNORMAL HIGH (ref 70–99)
Glucose-Capillary: 157 mg/dL — ABNORMAL HIGH (ref 70–99)
Glucose-Capillary: 163 mg/dL — ABNORMAL HIGH (ref 70–99)

## 2021-08-03 MED ORDER — ALVIMOPAN 12 MG PO CAPS
12.0000 mg | ORAL_CAPSULE | Freq: Two times a day (BID) | ORAL | Status: DC
  Administered 2021-08-04: 12 mg via ORAL
  Filled 2021-08-03: qty 1

## 2021-08-03 NOTE — Consult Note (Signed)
[facility] Nurse Consult Note: POD 1 ? ?[facility] Nurse ostomy consult note ?Consult received for new ileostomy created emergently yesterday afternoon (Dr. Constance Haw).  ?  ?[facility] Nurse will plan to see this weekend for stoma assessment, initial pouch change, and education as appropriate.  ? ?[facility] nursing team will follow, will remain available to this patient, the nursing and medical teams.   ? ?Thanks, ?Maudie Flakes, MSN, RN, [place], [place], CWON-AP, [place]  ?Pager# [phone]  ? ? ?  ?

## 2021-08-03 NOTE — Plan of Care (Signed)
Discussed with patient plan of care for the evening, pain management and dressing drainage with some teach back displayed.  Patient prefers to be called "Red."   ? ?Problem: Education: ?Goal: Knowledge of General Education information will improve ?Description: Including pain rating scale, medication(s)/side effects and non-pharmacologic comfort measures ?Outcome: Progressing ?  ?Problem: Health Behavior/Discharge Planning: ?Goal: Ability to manage health-related needs will improve ?Outcome: Progressing ?  ?

## 2021-08-03 NOTE — Progress Notes (Signed)
[facility] ? ?Looking good. Sitting up in bed. No nausea but some hiccups. No ostomy output. ? ?BP 104/79   Pulse (!) 103   Temp 98.2 ?F (36.8 ?C) (Oral)   Resp (!) 23   Ht '5\' 8"'$  (1.727 m)   Wt 72.8 kg   SpO2 100%   BMI 24.40 kg/m?  ?Ostomy pink, bowel sweat ?Midline with honeycomb some SS staining inferiorly ? 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 ? ?PRN for pain ?IS, OOB ?Ambulate ?HD ok ?Sips on liquids but no tray ?Entereg ordered until BM ?Ostomy RN coming over weekend  ?Foley out ?H&H responded to pRBC ?SCDs, heparin sq  ? ?Agree can be transferred. ?Spoke with patient and family. ? ?Curlene Labrum, MD ?[facility] ?[place], [place] [address] ?[phone] (office) ? ?

## 2021-08-03 NOTE — Progress Notes (Signed)
?PROGRESS NOTE ? ? ? ? ?Todd Richardson, is a 66 y.o. male, DOB - October 06, 1955, [id number]:[id number] ? ?Admit date - 07/31/2021   Admitting Physician Barton Dubois, MD ? ?Outpatient Primary MD for the patient is [name], MD ? ?LOS - 3 ? ?CC -Abd pain ?    ? ? ?Brief Narrative:  ? ?66 y.o. male with medical history significant of hypertension, asthma  and fam h/o Colon cancer in his father admitted on 07/31/21 with abdominal pain and bowel obstruction with concerns for cecal and sigmoid malignancy ? ?  ?-Assessment and Plan: ?* Large bowel obstruction due to cecal and sigmoid mass ?-In the setting of almost near complete obstruction from colon mass ?-High concerns for malignancy--especially given family history of colon cancer in his father and lack of prior screening colonoscopy until now ?-Colonoscopy on 07/31/2021 with obstructing colon mass (A frond-like/villous, fungating, infiltrative and polypoid nearly completely obstructing mass was found in the sigmoid colon approx 15 cm from the anal verge. The mass was circumferential. The mass measured five cm in length. Oozing was present. 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 ?Thrombocytosis ?--Could be reactive ? ?Hyponatremia ?--Suspect dehydration related ?-Sodium normalized with hydration ? ?Leukocytosis ?-Most likely a stress demargination and dehydration/reactive ?-Currently no frank/specific source of infection identified ? ? ?Hyperglycemia ?-No prior history of diabetes ?-A1C 5.8 ? ?Hypokalemia ?-Due to GI losses, replaced ? ?HTN (hypertension) ?- Blood  pressure overall stable ?-In the setting of hypokalemia and dehydration we will hold lisinopril/HCTZ ?- ?Social/ethics--- plan of care and advanced directives discussed with patient, his brother [name]/[name] present at bedside ?-Patient is a full code ? ?Acute anemia--- suspect this is related to underlying GI malignancy, hemoglobin currently above 8  ?Received 2 units of PRBC perioperatively on 08/03/2018 ? ?Disposition/Need for in-Hospital Stay- patient unable to be discharged at this time due to --- postoperative patient awaiting return of bowel function and tolerance of oral intake, continue IV fluids for now ? ?Status is: Inpatient  ? ?Disposition: The patient is from: Home ?             Anticipated d/c is to: Home ?             Anticipated d/c date is: > 3 days ?             Patient currently is not medically stable to d/c. 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 ?Code Status :  -  Code Status: Full Code  ? ?Family Communication:   (patient is alert, awake and coherent)  ?-Discussed with his brother [name] [name]) at bedside ? ?DVT Prophylaxis  :   - SCDs  heparin injection 5,000 Units Start: 07/31/21 1800 ? ? ?Lab Results  ?Component Value Date  ? PLT 536 (H) 08/03/2021  ? ?Inpatient Medications ? ?Scheduled Meds: ? Chlorhexidine Gluconate Cloth  6 each Topical Daily  ? heparin  5,000 Units Subcutaneous Q8H  ? ?Continuous Infusions: ? sodium chloride 75 mL/hr at 08/03/21 1514  ? ?PRN Meds:.acetaminophen **  OR** acetaminophen, HYDROmorphone (DILAUDID) injection, ipratropium-albuterol, ondansetron **OR** ondansetron (ZOFRAN) IV, phenol ? ? ?Anti-infectives (From admission, onward)  ? ? Start     Dose/Rate Route Frequency Ordered Stop  ? 08/02/21 1126  sodium chloride 0.9 % with cefoTEtan (CEFOTAN) ADS Med       ?Note to Pharmacy: Jerrye Beavers: cabinet override  ?    08/02/21 1126 08/02/21  1636  ? 08/01/21 1400  neomycin (MYCIFRADIN) tablet 1,000 mg       ?See Hyperspace for full Linked Orders Report.  ? 1,000 mg Oral 3 times per day 08/01/21 0937 08/01/21 2121  ? 08/01/21 1400  metroNIDAZOLE (FLAGYL) tablet 1,000 mg       ?See Hyperspace for full Linked Orders Report.  ? 1,000 mg Oral 3 times per day 08/01/21 0937 08/01/21 2121  ? 08/01/21 1030  cefoTEtan (CEFOTAN) 2 g in sodium chloride 0.9 % 100 mL IVPB  Status:  Discontinued       ? 2 g ?200 mL/hr over 30 Minutes Intravenous On call to O.R. 08/01/21 8937 08/02/21 0559  ? ?  ? ?Subjective: ?[name] today has no fevers,No chest pain,   ? ?Family at bedside, resting comfortably, tolerating sips ? ?Objective: ?Vitals:  ? 08/03/21 0800 08/03/21 0900 08/03/21 1100 08/03/21 1615  ?BP: 104/79     ?Pulse: 96 (!) 103    ?Resp: 16 (!) 23    ?Temp:   98.2 ?F (36.8 ?C) 98 ?F (36.7 ?C)  ?TempSrc:   Oral Oral  ?SpO2: 99% 100%    ?Weight:      ?Height:      ? ? ?Intake/Output Summary (Last 24 hours) at 08/03/2021 1733 ?Last data filed at 08/03/2021 1514 ?Gross per 24 hour  ?Intake 1491.27 ml  ?Output 215 ml  ?Net 1276.27 ml  ? ?Filed Weights  ? 07/31/21 1001 08/02/21 1825 08/03/21 0433  ?Weight: 72.1 kg 72.1 kg 72.8 kg  ? ? ?Physical Exam ? ?Gen:-Awake, alert, in no acute distress  ?HEENT:- [place].AT, No sclera icterus ?Neck-Supple Neck,No JVD,.  ?Lungs-improved air movement, no wheezing ?CV- S1, S2 normal, regular  ?Abd-not distended, appropriate postop tenderness, ileostomy no significant output ?extremity/Skin:- No  edema, pedal pulses present  ?Psych-alert and oriented x3, affect is appropriate ?Neuro-generalized weakness, no new focal deficits, no tremors ?GU-Foley cath to be removed on 08/03/2021 ? ?Data Reviewed: I have personally reviewed following labs and imaging studies ? ?CBC: ?Recent Labs  ?Lab 07/31/21 ?1547 08/01/21 ?0454 08/02/21 ?0505 08/03/21 ?0410  ?WBC 14.6* 10.9* 10.0 16.2*  ?HGB 9.8* 8.7* 8.4* 9.9*  ?HCT 30.4* 27.2* 27.8* 30.3*  ?MCV 91.8 92.2  93.3 92.7  ?PLT 799* 711* 683* 536*  ? ?Basic Metabolic Panel: ?Recent Labs  ?Lab 07/31/21 ?1547 08/01/21 ?0454 08/02/21 ?0505 08/03/21 ?0410  ?NA 133* 132* 134* 136  ?K 3.1* 3.7 3.8 4.3  ?CL 104 102 104 106  ?CO2 '23 24 24 23  '$ ?GLUCOSE 151* 129* 120* 177*  ?BUN '13 12 11 14  '$ ?CREATININE 0.73 0.76 0.81 1.19  ?CALCIUM 8.2* 8.1* 8.0* 7.6*  ?MG 2.3 2.5*  --   --   ?PHOS 3.8  --   --  5.0*  ? ?GFR: ?Estimated Creatinine Clearance: 60.7 mL/min (by C-G formula based on SCr of 1.19 mg/dL). ?Liver Function Tests: ?Recent Labs  ?Lab 07/31/21 ?1547 08/03/21 ?0410  ?AST 20  --   ?ALT 15  --   ?ALKPHOS 83  --   ?BILITOT 0.5  --   ?PROT 5.5*  --   ?  ALBUMIN 2.3* 1.5*  ? ?Cardiac Enzymes: ?No results for input(s): CKTOTAL, CKMB, CKMBINDEX, TROPONINI in the last 168 hours. ?BNP (last 3 results) ?No results for input(s): PROBNP in the last 8760 hours. ?HbA1C: ?Recent Labs  ?  08/01/21 ?0949  ?HGBA1C 5.8*  ? ?Sepsis Labs: ?'@LABRCNTIP'$ (procalcitonin:4,lacticidven:4) ?) ?Recent Results (from the past 240 hour(s))  ?Surgical PCR screen     Status: None  ? Collection Time: 08/02/21 12:48 AM  ? Specimen: Nasal Mucosa; Nasal Swab  ?Result Value Ref Range Status  ? MRSA, PCR NEGATIVE NEGATIVE Final  ? Staphylococcus aureus NEGATIVE NEGATIVE Final  ?  Comment: (NOTE) ?The Xpert SA Assay (FDA approved for NASAL specimens in patients 44 ?years of age and older), is one component of a comprehensive ?surveillance program. It is not intended to diagnose infection nor to ?guide or monitor treatment. ?Performed at [facility], [address] [place], [place] [address] ?  ?  ?Radiology Studies: ?DG Abd Portable 1V ? ?Result Date: 08/02/2021 ?CLINICAL DATA:  Nasogastric tube placement. EXAM: PORTABLE ABDOMEN - 1 VIEW COMPARISON:  July 31, 2021. FINDINGS: Distal tip of nasogastric tube is seen in expected position of distal stomach. Stable air-filled dilatation of transverse colon is noted. Severe degenerative change in right hip is noted. IMPRESSION:  Distal tip of nasogastric tube seen in expected position of distal stomach. Electronically Signed   By: Marijo Conception M.D.   On: 08/02/2021 09:01   ? ? ?Scheduled Meds: ? Chlorhexidine Gluconate Cloth  6 e

## 2021-08-04 DIAGNOSIS — K56609 Unspecified intestinal obstruction, unspecified as to partial versus complete obstruction: Secondary | ICD-10-CM | POA: Diagnosis not present

## 2021-08-04 LAB — BASIC METABOLIC PANEL
Anion gap: 3 — ABNORMAL LOW (ref 5–15)
BUN: 17 mg/dL (ref 8–23)
CO2: 25 mmol/L (ref 22–32)
Calcium: 7.5 mg/dL — ABNORMAL LOW (ref 8.9–10.3)
Chloride: 107 mmol/L (ref 98–111)
Creatinine, Ser: 0.98 mg/dL (ref 0.61–1.24)
GFR, Estimated: >60 mL/min (ref >60)
Glucose, Bld: 133 mg/dL — ABNORMAL HIGH (ref 70–99)
Potassium: 4.1 mmol/L (ref 3.5–5.1)
Sodium: 135 mmol/L (ref 135–145)

## 2021-08-04 LAB — CBC
HCT: 25.6 % — ABNORMAL LOW (ref 39.0–52.0)
Hemoglobin: 8.2 g/dL — ABNORMAL LOW (ref 13.0–17.0)
MCH: 30.3 pg (ref 26.0–34.0)
MCHC: 32 g/dL (ref 30.0–36.0)
MCV: 94.5 fL (ref 80.0–100.0)
Platelets: 447 10*3/uL — ABNORMAL HIGH (ref 150–400)
RBC: 2.71 MIL/uL — ABNORMAL LOW (ref 4.22–5.81)
RDW: 16 % — ABNORMAL HIGH (ref 11.5–15.5)
WBC: 12.7 10*3/uL — ABNORMAL HIGH (ref 4.0–10.5)
nRBC: 0 % (ref 0.0–0.2)

## 2021-08-04 LAB — GLUCOSE, CAPILLARY
Glucose-Capillary: 134 mg/dL — ABNORMAL HIGH (ref 70–99)
Glucose-Capillary: 135 mg/dL — ABNORMAL HIGH (ref 70–99)

## 2021-08-04 MED ORDER — TAMSULOSIN HCL 0.4 MG PO CAPS
0.4000 mg | ORAL_CAPSULE | Freq: Two times a day (BID) | ORAL | Status: DC
  Administered 2021-08-04 – 2021-08-09 (×10): 0.4 mg via ORAL
  Filled 2021-08-04 (×10): qty 1

## 2021-08-04 NOTE — Consult Note (Addendum)
[facility] Nurse ostomy consult note: POD 2 ? ?Patient reports that he has been sitting up in the chair all day at his request. His coccygeal area is reddened, but blanches. Scrotal edema present. ? ?Stoma type/location: RLQ end ileostomy ?Stomal assessment/size: Slightly less than 1 and 1/2 inches, lumen in center, edematous, red, moist ?Peristomal assessment: Intact, mild herniation at superior aspect of stoma vs overhang of skin. ?Treatment options for stomal/peristomal skin: skin barrier ring ?Output: moderate to large amounts of thin dark effluent ?Ostomy pouching: 2pc. 2 and 1/2 inch pouching system with skin barrier ring.  ?Education provided:  ?Social: Present for session was niece by marriage, but her husband, the patient's nephew was raised by the patient. She reports they are like a son and daughter-in-law. The patient will be going home with them post discharge. Nephew [name] Kitchen) is retired and will be assisting patient. ?Education:  ?Explained role of ostomy nurse and creation of stoma  ?Explained stoma characteristics (budded, flush, color, texture, care) ?Demonstrated pouch change (cutting new skin barrier, measuring stoma, cleaning peristomal skin and stoma, use of barrier ring-placed on skin rather than on back of skin barrier today) ?Education on emptying when 1/3 to 1/2 full and how to empty, simulated. ?Demonstrated use of wick to clean spout, simulated (patient performed) ?Demonstrated and patient able to give return demonstration X2 of Lock and Roll Closure ?Discussed diarrhea, dehydration, reviewed teaching sheet on food for thickening of effluent.  ?Discussed food blockage, no large boluses or swallows of under-chewed foods  ? ?Supplies ordered to bedside: 7 pouches, 7 skin barriers, 7 skin barrier rings. ? ?Note: 1 1-piece convex pouch is left at bedside in the event it is needed on reassessment on Tuesday. ? ?Answered patient/family questions: Yes.   ?  ?Enrolled patient in [facility]  D/C program: Yes. ?Niece requests (and patient agrees) that Secure Start Sample/Welcome box be sent to their home.  Address provided: ?[address] ?[name]i [address] ?[address] ?[place], [place] [address] ? ?My associate will visit again on Tuesday, 08/07/21. She will arrange a time with nephew, [name] Kitchen (cell: [phone]) so that he can be present for teaching. ? ?A pressure redistribution chair pad is ordered today and a sacral foam dressing is ordered for blanching erythema at the coccyx and for edema in the scrotal, penile and perineal areas. He has been taught to turn from side to side while in bed and to limit the time OOB in the chair to 4 hours, then return to bed for 2 hours. He will "boost" himself in the chair every 30 minutes. When in bed, the patient is instructed to keep the OB at the lowest angle at which he is comfortable, preferably at a 30 degree angle or less. ? ?[facility] nursing team will follow, and will remain available to this patient, the nursing and medical teams. ?  ?Thanks, ?Maudie Flakes, MSN, RN, [place], [place], CWON-AP, [place]  ?Pager# [phone]  ?

## 2021-08-04 NOTE — Progress Notes (Signed)
Patient hasn't voided since foley removal at 1000. Patient has dangled at bedside; been walked to stand at bathroom and stand at bedside to void with no success.  ? ?Bladder scan(s): ?1700 - 98 ml ?2100 - 134 ml ?0200 - 196 ml ? ? ?No results at this time. No complaints of distress or pain when pressing on his bladder.  Informed Dr. Josephine Cables and will continue to monitor til next check at 0600.  Patient is receiving sips of water and coffee.  On IVF NS @ 75 ml/hr per previous MD order.  ?

## 2021-08-04 NOTE — Progress Notes (Signed)
[facility] Progress Note ? ?2 Days Post-Op  ?Subjective: ?Doing well but having some urinary retention. Ostomy making stuff. Up in the chair.  ? ?Objective: ?Vital signs in last 24 hours: ?Temp:  [97.4 ?F (36.3 ?C)-98.5 ?F (36.9 ?C)] 97.8 ?F (36.6 ?C) (04/08 1126) ?Pulse Rate:  [71-100] 94 (04/08 1330) ?Resp:  [10-21] 21 (04/08 1330) ?BP: (96-133)/(67-87) 113/80 (04/08 1300) ?SpO2:  [91 %-100 %] 98 % (04/08 1330) ?Last BM Date :  (dark green ostomy output) ? ?Intake/Output from previous day: ?04/07 0701 - 04/08 0700 ?In: 2517.4 [P.O.:580; I.V.:1937.4] ?Out: 600 [Urine:450; Stool:150] ?Intake/Output this shift: ?Total I/O ?In: 322.1 [I.V.:322.1] ?Out: 350 [Stool:350] ? ?General appearance: alert and no distress ?GI: soft, ileostomy pink and healthy, liquid in bag ? ?Lab Results:  ?Recent Labs  ?  08/03/21 ?0410 08/04/21 ?0323  ?WBC 16.2* 12.7*  ?HGB 9.9* 8.2*  ?HCT 30.3* 25.6*  ?PLT 536* 447*  ? ?BMET ?Recent Labs  ?  08/03/21 ?0410 08/04/21 ?0323  ?NA 136 135  ?K 4.3 4.1  ?CL 106 107  ?CO2 23 25  ?GLUCOSE 177* 133*  ?BUN 14 17  ?CREATININE 1.19 0.98  ?CALCIUM 7.6* 7.5*  ? ?PT/INR ?No results for input(s): LABPROT, INR in the last 72 hours. ? ?Studies/Results: ?No results found. ? ?Anti-infectives: ?Anti-infectives (From admission, onward)  ? ? Start     Dose/Rate Route Frequency Ordered Stop  ? 08/02/21 1126  sodium chloride 0.9 % with cefoTEtan (CEFOTAN) ADS Med       ?Note to Pharmacy: Jerrye Beavers: cabinet override  ?    08/02/21 1126 08/02/21 1636  ? 08/01/21 1400  neomycin (MYCIFRADIN) tablet 1,000 mg       ?See Hyperspace for full Linked Orders Report.  ? 1,000 mg Oral 3 times per day 08/01/21 0937 08/01/21 2121  ? 08/01/21 1400  metroNIDAZOLE (FLAGYL) tablet 1,000 mg       ?See Hyperspace for full Linked Orders Report.  ? 1,000 mg Oral 3 times per day 08/01/21 0937 08/01/21 2121  ? 08/01/21 1030  cefoTEtan (CEFOTAN) 2 g in sodium chloride 0.9 % 100 mL IVPB  Status:  Discontinued        ? 2 g ?200 mL/hr over 30 Minutes Intravenous On call to O.R. 08/01/21 3009 08/02/21 0559  ? ?  ? ? ?Assessment/Plan: ?Patient s/p  Subtotal colectomy, end ileostomy for  cancer. ?PRN for pain ?IS, OOB ?Dc entereg ?Soft diet, have ostomy output and no nausea ?Labs in AM ?Will need to start really watching intake and output of the ileostomy ?Discussed with Dr. Joesph Fillers ? ?Ileostomy Output: ? ?Monitor how much output you have from your ileostomy a day and drink at least twice that amount in fluids as you will also have losses from sweat and urine.  ?Example: 1 L output you need 2 L of intake  ? ?If you are having more than 1 liter of output a day, please start these medications in a stepwise fashion. ?Metamucil 34 g packet in 30 mL of water daily.  ?Imodium 4 mg four times a day; 30 minutes before meals and before bed.  ?Lomotil 2.5 mg four times a day; 30 minutes before meals and before bed.  ?Cholestyramine 4 g four times a day; 30 minutes before meals and before bed.  ?(You will need prescriptions for these medications.) ? ?Diet Changes: ?- Chew food well.  ?- Eat low sugar foods and drinks.  ?- Eat salty foods and add salt to  meals and snacks.  ?- Eat smaller more frequent meals and snacks.  ?- Drink fluids ? hour before or after meals, not with food.  ?- Avoid alcohol and caffeine.  ?- Eat more soluble fiber which forms a gel when mixed with water and slows movement.  ?- Good sources of soluble fiber include: peeled sweet potatoes, applesauce, refried beans, wheat and oat bran.  ? ?Try adding these foods that naturally thicken stool to your meals:  ?- Applesauce  ?- Cream of rice  ?- Peanut butter (creamy)  ?- Bananas  ?- Marshmallows ?- Rice  ?- Cheese  ?- Mashed potatoes  ?- Soda crackers  ?- Tapioca    ? ?If you are still having trouble with output, please let us know in your office.   ?You may need to adjust these medications as you have less output.  ? ? ? LOS: 4 days  ? ? ?Todd Richardson ?08/04/2021 ? ?

## 2021-08-04 NOTE — Progress Notes (Signed)
?PROGRESS NOTE ? ? ? ? ?Todd Richardson, is a 66 y.o. male, DOB - 20-Sep-1955, NWG:[id number] ? ?Admit date - 07/31/2021   Admitting Physician Barton Dubois, MD ? ?Outpatient Primary MD for the patient is [name], MD ? ?LOS - 4 ? ?CC -Abd pain ?    ? ? ?Brief Narrative:  ? ?66 y.o. male with medical history significant of hypertension, asthma  and fam h/o Colon cancer in his father admitted on 07/31/21 with abdominal pain and bowel obstruction with concerns for cecal and sigmoid malignancy ? ?  ?-Assessment and Plan: ? 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 The mass was circumferential. The mass measured five cm in length. Oozing was present.  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 ?Thrombocytosis ?--Could be reactive ? ?Hyponatremia ?--Suspect dehydration related ?-Sodium normalized with hydration ? ?Leukocytosis ?-Most likely a stress demargination and dehydration/reactive ?-Currently no frank/specific source of infection identified ?-WBC trending down ? ?Hyperglycemia ?-No prior history of diabetes ?-A1C 5.8 ? ?Hypokalemia ?-Due to GI losses, replaced ? ?HTN (hypertension) ?- Blood pressure  overall stable ?-In the setting of hypokalemia and dehydration ?-- Continue to hold lisinopril/HCTZ ?- ?Social/Ethics--- plan of care and advanced directives discussed with patient, his brother [name]/[name] present at bedside ?-Patient is a full code ? ?Acute Anemia--- suspect this is related to underlying GI malignancy, hemoglobin currently above 8  ?Received 2 units of PRBC perioperatively on 08/02/2021 ? ?Urinary Retention--Foley removed 08/03/2021 ?-Voiding difficulties noted required in and out catheterization ?-Flomax started ?-Consider imaging if urinary difficulties persist ? ?Disposition/Need for in-Hospital Stay- patient unable to be discharged at this time due to --- postoperative patient awaiting return of bowel function and tolerance of oral intake, continue IV fluids for now ? ?Status is: Inpatient  ? ?Disposition: The patient is from: Home ?             Anticipated d/c is to: Home ?             Anticipated d/c date is: > 3 days ?             Patient currently is not medically stable to d/c. 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 ?Code Status :  -  Code Status: Full Code  ? ?Family Communication:   (patient is alert, awake and coherent)  ?-Discussed with his brother [name] [name]) at bedside ? ?DVT Prophylaxis  :   - SCDs  heparin injection 5,000 Units Start: 07/31/21 1800 ? ? ?Lab Results  ?Component Value Date  ? PLT 447 (H) 08/04/2021  ? ?Inpatient Medications ? ?Scheduled Meds: ? Chlorhexidine Gluconate Cloth  6 each Topical Daily  ? heparin  5,000 Units Subcutaneous Q8H  ? tamsulosin  0.4 mg Oral BID  ? ?Continuous Infusions: ? sodium chloride 75 mL/hr at 08/04/21 1631  ? ?PRN Meds:.acetaminophen **OR** acetaminophen, HYDROmorphone (DILAUDID) injection, ipratropium-albuterol, ondansetron **OR** ondansetron (ZOFRAN) IV, phenol ? ? ?Anti-infectives (From admission, onward)  ? ?  Start     Dose/Rate Route Frequency Ordered Stop  ? 08/02/21 1126  sodium chloride 0.9 % with cefoTEtan (CEFOTAN) ADS Med       ?Note to Pharmacy: Jerrye Beavers: cabinet override  ?    08/02/21 1126 08/02/21 1636  ? 08/01/21 1400  neomycin (MYCIFRADIN) tablet 1,000 mg       ?See Hyperspace for full Linked Orders Report.  ? 1,000 mg Oral 3 times per day 08/01/21 0937 08/01/21 2121  ? 08/01/21 1400  metroNIDAZOLE (FLAGYL) tablet 1,000 mg       ?See Hyperspace for full Linked Orders Report.  ? 1,000 mg Oral 3 times per day 08/01/21 0937 08/01/21 2121  ? 08/01/21 1030  cefoTEtan (CEFOTAN) 2 g in sodium chloride 0.9 % 100 mL IVPB  Status:  Discontinued       ? 2 g ?200 mL/hr over 30 Minutes Intravenous On call to O.R. 08/01/21 4742 08/02/21 0559  ? ?  ? ?Subjective: ?Todd Richardson today has no fevers,No chest pain,   ? ?Tolerating liquids well ?-Not voiding much required in and out catheterization after Foley removal on 08/03/2021 ? ?Objective: ?Vitals:  ? 08/04/21 1300 08/04/21 1330 08/04/21 1400 08/04/21 1500  ?BP: 113/80  113/89 116/76  ?Pulse: 94 94 94 96  ?Resp:  (!) 21 16   ?Temp:      ?TempSrc:      ?SpO2: 96% 98% 95% 95%  ?Weight:      ?Height:      ? ? ?Intake/Output Summary (Last 24 hours) at 08/04/2021 1716 ?Last data filed at 08/04/2021 1645 ?Gross per 24 hour  ?Intake 2544.49 ml  ?Output 1500 ml  ?Net 1044.49 ml  ? ?Filed Weights  ? 07/31/21 1001 08/02/21 1825 08/03/21 0433  ?Weight: 72.1 kg 72.1 kg 72.8 kg  ? ? ?Physical Exam ? ?Gen:-Awake, alert, in no acute distress  ?HEENT:- [place].AT, No sclera icterus ?Neck-Supple Neck,No JVD,.  ?Lungs-Fair air movement, no wheezing ?CV- S1, S2 normal, regular  ?Abd-not distended, appropriate postop tenderness, ileostomy with liquid contents/output ?extremity/Skin:- No  edema, pedal pulses present  ?Psych-alert and oriented x3, affect is appropriate ?Neuro-generalized weakness, no new focal deficits, no tremors ? ? ?Data Reviewed: I have personally reviewed following labs and  imaging studies ? ?CBC: ?Recent Labs  ?Lab 07/31/21 ?1547 08/01/21 ?0454 08/02/21 ?0505 08/03/21 ?0410 08/04/21 ?5956  ?WBC 14.6* 10.9* 10.0 16.2* 12.7*  ?HGB 9.8* 8.7* 8.4* 9.9* 8.2*  ?HCT 30.4* 27.2* 27.8* 30.3* 25.6*  ?MCV 91.8 92.2 93.3 92.7 94.5  ?PLT 799* 711* 683* 536* 447*  ? ?Basic Metabolic Panel: ?Recent Labs  ?Lab 07/31/21 ?1547 08/01/21 ?0454 08/02/21 ?0505 08/03/21 ?0410 08/04/21 ?3875  ?NA 133* 132* 134* 136 135  ?K 3.1* 3.7 3.8 4.3 4.1  ?CL 104 102 104 106 107  ?CO2 '23 24 24 23 25  '$ ?GLUCOSE 151* 129* 120* 177* 133*  ?BUN '13 12 11 14 17  '$ ?CREATININE 0.73 0.76 0.81 1.19 0.98  ?CALCIUM 8.2* 8.1* 8.0* 7.6* 7.5*  ?MG 2.3 2.5*  --   --   --   ?PHOS 3.8  --   --  5.0*  --   ? ?GFR: ?Estimated Creatinine Clearance: 73.7 mL/min (by C-G formula based on SCr of 0.98  mg/dL). ?Liver Function Tests: ?Recent Labs  ?Lab 07/31/21 ?1547 08/03/21 ?0410  ?AST 20  --   ?ALT 15  --   ?ALKPHOS 83  --   ?BILITOT 0.5  --   ?PROT 5.5*  --   ?ALBUMIN 2.3* 1.5*  ? ?Cardiac Enzymes: ?No results for input(s): CKTOTAL, CKMB, CKMBINDEX, TROPONINI in the last 168 hours. ?BNP (last 3 results) ?No results for input(s): PROBNP in the last 8760 hours. ?HbA1C: ?No results for input(s): HGBA1C in the last 72 hours. ? ?Sepsis Labs: ?'@LABRCNTIP'$ (procalcitonin:4,lacticidven:4) ?) ?Recent Results (from the past 240 hour(s))  ?Surgical PCR screen     Status: None  ? Collection Time: 08/02/21 12:48 AM  ? Specimen: Nasal Mucosa; Nasal Swab  ?Result Value Ref Range Status  ? MRSA, PCR NEGATIVE NEGATIVE Final  ? Staphylococcus aureus NEGATIVE NEGATIVE Final  ?  Comment: (NOTE) ?The Xpert SA Assay (FDA approved for NASAL specimens in patients 66 ?years of age and older), is one component of a comprehensive ?surveillance program. It is not intended to diagnose infection nor to ?guide or monitor treatment. ?Performed at [facility], [address], [place], [place] [address] ?  ?  ?Radiology Studies: ?No results found. ? ?Scheduled Meds: ?  Chlorhexidine Gluconate Cloth  6 each Topical Daily  ? heparin  5,000 Units Subcutaneous Q8H  ? tamsulosin  0.4 mg Oral BID  ? ?Continuous Infusions: ? sodium chloride 75 mL/hr at 08/04/21 1631  ? ? ? LOS: 4 days  ? ?Cou

## 2021-08-05 DIAGNOSIS — K56609 Unspecified intestinal obstruction, unspecified as to partial versus complete obstruction: Secondary | ICD-10-CM | POA: Diagnosis not present

## 2021-08-05 LAB — BASIC METABOLIC PANEL
Anion gap: 3 — ABNORMAL LOW (ref 5–15)
BUN: 14 mg/dL (ref 8–23)
CO2: 26 mmol/L (ref 22–32)
Calcium: 7.7 mg/dL — ABNORMAL LOW (ref 8.9–10.3)
Chloride: 107 mmol/L (ref 98–111)
Creatinine, Ser: 0.75 mg/dL (ref 0.61–1.24)
GFR, Estimated: >60 mL/min (ref >60)
Glucose, Bld: 126 mg/dL — ABNORMAL HIGH (ref 70–99)
Potassium: 3.7 mmol/L (ref 3.5–5.1)
Sodium: 136 mmol/L (ref 135–145)

## 2021-08-05 LAB — TYPE AND SCREEN
ABO/RH(D): O POS
Antibody Screen: NEGATIVE
Unit division: 0
Unit division: 0
Unit division: 0
Unit division: 0

## 2021-08-05 LAB — CBC
HCT: 25.1 % — ABNORMAL LOW (ref 39.0–52.0)
HCT: 26.4 % — ABNORMAL LOW (ref 39.0–52.0)
Hemoglobin: 8.3 g/dL — ABNORMAL LOW (ref 13.0–17.0)
Hemoglobin: 8.3 g/dL — ABNORMAL LOW (ref 13.0–17.0)
MCH: 30.7 pg (ref 26.0–34.0)
MCH: 29.9 pg (ref 26.0–34.0)
MCHC: 33.1 g/dL (ref 30.0–36.0)
MCHC: 31.4 g/dL (ref 30.0–36.0)
MCV: 93 fL (ref 80.0–100.0)
MCV: 95 fL (ref 80.0–100.0)
Platelets: 411 10*3/uL — ABNORMAL HIGH (ref 150–400)
Platelets: 386 10*3/uL (ref 150–400)
RBC: 2.7 MIL/uL — ABNORMAL LOW (ref 4.22–5.81)
RBC: 2.78 MIL/uL — ABNORMAL LOW (ref 4.22–5.81)
RDW: 16 % — ABNORMAL HIGH (ref 11.5–15.5)
RDW: 15.9 % — ABNORMAL HIGH (ref 11.5–15.5)
WBC: 8.8 10*3/uL (ref 4.0–10.5)
WBC: 10.8 10*3/uL — ABNORMAL HIGH (ref 4.0–10.5)
nRBC: 0 % (ref 0.0–0.2)
nRBC: 0 % (ref 0.0–0.2)

## 2021-08-05 LAB — BPAM RBC
Blood Product Expiration Date: 202305112359
Blood Product Expiration Date: 202305112359
Blood Product Expiration Date: 202305122359
Blood Product Expiration Date: 202305122359
ISSUE DATE / TIME: 202304061439
ISSUE DATE / TIME: 202304061459
Unit Type and Rh: 5100
Unit Type and Rh: 5100
Unit Type and Rh: 5100
Unit Type and Rh: 5100

## 2021-08-05 LAB — GLUCOSE, CAPILLARY
Glucose-Capillary: 126 mg/dL — ABNORMAL HIGH (ref 70–99)
Glucose-Capillary: 113 mg/dL — ABNORMAL HIGH (ref 70–99)

## 2021-08-05 MED ORDER — PSYLLIUM 95 % PO PACK
1.0000 | PACK | Freq: Every day | ORAL | Status: DC
  Administered 2021-08-05 – 2021-08-09 (×4): 1 via ORAL
  Filled 2021-08-05 (×6): qty 1

## 2021-08-05 MED ORDER — LOPERAMIDE HCL 2 MG PO CAPS
2.0000 mg | ORAL_CAPSULE | Freq: Three times a day (TID) | ORAL | Status: DC
  Administered 2021-08-05: 2 mg via ORAL
  Filled 2021-08-05: qty 1

## 2021-08-05 MED ORDER — SODIUM CHLORIDE 0.9 % IV BOLUS
1000.0000 mL | Freq: Once | INTRAVENOUS | Status: AC
  Administered 2021-08-05: 1000 mL via INTRAVENOUS

## 2021-08-05 MED ORDER — ALUM & MAG HYDROXIDE-SIMETH 200-200-20 MG/5ML PO SUSP
30.0000 mL | ORAL | Status: DC | PRN
  Administered 2021-08-05 – 2021-08-09 (×5): 30 mL via ORAL
  Filled 2021-08-05 (×6): qty 30

## 2021-08-05 MED ORDER — DILTIAZEM HCL-DEXTROSE 125-5 MG/125ML-% IV SOLN (PREMIX)
5.0000 mg/h | INTRAVENOUS | Status: DC
  Administered 2021-08-05 – 2021-08-06 (×2): 5 mg/h via INTRAVENOUS
  Filled 2021-08-05 (×2): qty 125

## 2021-08-05 MED ORDER — DILTIAZEM HCL 25 MG/5ML IV SOLN
10.0000 mg | Freq: Once | INTRAVENOUS | Status: AC
  Administered 2021-08-05: 10 mg via INTRAVENOUS
  Filled 2021-08-05: qty 5

## 2021-08-05 MED ORDER — LOPERAMIDE HCL 2 MG PO CAPS
4.0000 mg | ORAL_CAPSULE | Freq: Three times a day (TID) | ORAL | Status: DC
  Administered 2021-08-05 – 2021-08-09 (×15): 4 mg via ORAL
  Filled 2021-08-05 (×15): qty 2

## 2021-08-05 MED ORDER — POTASSIUM CHLORIDE CRYS ER 20 MEQ PO TBCR: 40.0000 meq | EXTENDED_RELEASE_TABLET | ORAL | Status: DC

## 2021-08-05 MED ORDER — POTASSIUM CHLORIDE 20 MEQ PO PACK
40.0000 meq | PACK | ORAL | Status: AC
  Administered 2021-08-05 (×2): 40 meq via ORAL
  Filled 2021-08-05 (×2): qty 2

## 2021-08-05 NOTE — Plan of Care (Signed)

## 2021-08-05 NOTE — Progress Notes (Signed)
Pt's bladder scans under <200 cc throughout the day and past 2 scans at ~2100 and ~2340 were 270 cc. Patient reports he is unable to void at this time, but not expressing discomfort. Dr. Orie Rout made aware that the patient has not voided since intermittent cath at 0600 yesterday, in addition to low urine production and ileostomy output ~50-100 cc/hr. No new orders received at this time, will continue to monitor patient accordingly.  ?

## 2021-08-05 NOTE — Progress Notes (Signed)
?PROGRESS NOTE ? ? ?Todd Richardson, is a 66 y.o. male, DOB - 07/17/1955, NAT:[id number] ? ?Admit date - 07/31/2021   Admitting Physician Barton Dubois, MD ? ?Outpatient Primary MD for the patient is [name], MD ? ?LOS - 5 ? ?CC -Abd pain ?    ? ?Brief Narrative:  ? ?66 y.o. male with medical history significant of hypertension, asthma  and fam h/o Colon cancer in his father admitted on 07/31/21 with abdominal pain and bowel obstruction with concerns for cecal and sigmoid malignancy ? ?  ?-Assessment and Plan: ? 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 The mass was circumferential. The mass measured five cm in length. Oozing was present.  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 ?-Postoperative management per general surgery team\ ?-Diet advanced by general surgery team ?--Very high output from ostomy bag with over 1600 mL in last 12 hours ?-Per general surgeon okay to try Metamucil and Imodium to reduce ostomy output to reduce risk for dehydration ?-Unable to discharge home on this time as patient is at very high risk of dehydration given the amount of ostomy output ?-Ostomy wound RN consult ? ?Thrombocytosis ?--Could be reactive ? ?Hyponatremia ?--Suspect dehydration related ?-Sodium normalized with  hydration ? ?Leukocytosis ?-Most likely a stress demargination and dehydration/reactive ?-Currently no frank/specific source of infection identified ?-WBC has normalized ? ? ?Hyperglycemia ?-No prior history of diabetes ?-A1C 5.8 ? ?Hypokalemia ?-Due to GI losses, replaced ? ?HTN (hypertension) ?- Blood pressure overall stable ?-In the setting of hypokalemia and dehydration ?-- Continue to hold lisinopril/HCTZ ?- ?Social/Ethics--- plan of care and advanced directives discussed with patient, his brother [name]/[name] present at bedside ?-Patient is a full code ? ?Acute Anemia--- suspect this is related to underlying GI malignancy, hemoglobin currently above 8  ?Received 2 units of PRBC perioperatively on 08/02/2021 ? ?Urinary Retention--Foley removed 08/03/2021 ?-Voiding difficulties noted-- required in and out catheterization ?-Continues to have poor urine output in the setting of very very high output from ostomy bag ?-Continue Flomax, bladder scan with not very significant amount of urine in the bladder ? ?Disposition/Need for in-Hospital Stay- patient unable to be discharged at this time due to --- postoperative patient awaiting return of bowel function and tolerance of oral intake, continue IV fluids for now ?-Possible discharge in 24 to 48 hours if ostomy output improves with less risk for dehydration ? ?Status is: Inpatient  ? ?Disposition: The patient is from: Home ?             Anticipated d/c is to: Home ?             Anticipated d/c date is: 1 day ?             Patient currently is not medically stable to d/c. 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 ?  Code Status :  -  Code Status: Full Code  ? ?Family Communication:   (patient is alert, awake and coherent)  ?-Discussed with his brother [name] [name]) at bedside ? ?DVT Prophylaxis  :   - SCDs  heparin injection 5,000 Units Start: 07/31/21  1800 ? ? ?Lab Results  ?Component Value Date  ? PLT 411 (H) 08/05/2021  ? ?Inpatient Medications ? ?Scheduled Meds: ? Chlorhexidine Gluconate Cloth  6 each Topical Daily  ? heparin  5,000 Units Subcutaneous Q8H  ? loperamide  4 mg Oral TID WC & HS  ? psyllium  1 packet Oral Daily  ? tamsulosin  0.4 mg Oral BID  ? ?Continuous Infusions: ? sodium chloride 75 mL/hr at 08/05/21 1524  ? ?PRN Meds:.acetaminophen **OR** acetaminophen, HYDROmorphone (DILAUDID) injection, ipratropium-albuterol, ondansetron **OR** ondansetron (ZOFRAN) IV, phenol ? ? ?Anti-infectives (From admission, onward)  ? ? Start     Dose/Rate Route Frequency Ordered Stop  ? 08/02/21 1126  sodium chloride 0.9 % with cefoTEtan (CEFOTAN) ADS Med       ?Note to Pharmacy: Jerrye Beavers: cabinet override  ?    08/02/21 1126 08/02/21 1636  ? 08/01/21 1400  neomycin (MYCIFRADIN) tablet 1,000 mg       ?See Hyperspace for full Linked Orders Report.  ? 1,000 mg Oral 3 times per day 08/01/21 0937 08/01/21 2121  ? 08/01/21 1400  metroNIDAZOLE (FLAGYL) tablet 1,000 mg       ?See Hyperspace for full Linked Orders Report.  ? 1,000 mg Oral 3 times per day 08/01/21 0937 08/01/21 2121  ? 08/01/21 1030  cefoTEtan (CEFOTAN) 2 g in sodium chloride 0.9 % 100 mL IVPB  Status:  Discontinued       ? 2 g ?200 mL/hr over 30 Minutes Intravenous On call to O.R. 08/01/21 3220 08/02/21 0559  ? ?  ? ?Subjective: ?Amish Mintzer today has no fevers,No chest pain,   ? ?-Tolerating diet okay ?-Brother [name] at bedside ?-Very high output from ostomy bag with over 1600 mL in last 12 hours ?-Continues to have poor urine output ? ?Objective: ?Vitals:  ? 08/05/21 0930 08/05/21 1115 08/05/21 1352 08/05/21 1518  ?BP: 103/73  91/66 104/82  ?Pulse: (!) 103 91 93   ?Resp: '15 19  15  '$ ?Temp:  98.5 ?F (36.9 ?C)    ?TempSrc:  Oral    ?SpO2: 99% 97%    ?Weight:      ?Height:      ? ? ?Intake/Output Summary (Last 24 hours) at 08/05/2021 1535 ?Last data filed at 08/05/2021 1524 ?Gross per 24 hour  ?Intake  3374.86 ml  ?Output 2995 ml  ?Net 379.86 ml  ? ?Filed Weights  ? 07/31/21 1001 08/02/21 1825 08/03/21 0433  ?Weight: 72.1 kg 72.1 kg 72.8 kg  ? ? ?Physical Exam ? ?Gen:-Awake, alert, in no acute distress  ?HEENT:- [place].AT, No sclera icterus ?Neck-Supple Neck,No JVD,.  ?Lungs-Fair air movement, no wheezing ?CV- S1, S2 normal, regular  ?Abd-not distended, appropriate postop tenderness, ileostomy with liquid contents/output ?extremity/Skin:- No  edema, pedal pulses present  ?Psych-alert and oriented x3, affect is appropriate ?Neuro-generalized weakness, no new focal deficits, no tremors ? ? ?Data Reviewed: I have personally reviewed following labs and imaging studies ? ?CBC: ?Recent Labs  ?Lab 08/01/21 ?0454 08/02/21 ?0505 08/03/21 ?0410 08/04/21 ?2542 08/05/21 ?0533  ?WBC 10.9* 10.0 16.2* 12.7* 8.8  ?HGB 8.7* 8.4* 9.9* 8.2* 8.3*  ?HCT 27.2* 27.8* 30.3* 25.6* 25.1*  ?MCV 92.2 93.3 92.7 94.5 93.0  ?  PLT 711* 683* 536* 447* 411*  ? ?Basic Metabolic Panel: ?Recent Labs  ?Lab 07/31/21 ?1547 08/01/21 ?0454 08/02/21 ?0505 08/03/21 ?0410 08/04/21 ?3762 08/05/21 ?0533  ?NA 133* 132* 134* 136 135 136  ?K 3.1* 3.7 3.8 4.3 4.1 3.7  ?CL 104 102 104 106 107 107  ?CO2 '23 24 24 23 25 26  '$ ?GLUCOSE 151* 129* 120* 177* 133* 126*  ?BUN '13 12 11 14 17 14  '$ ?CREATININE 0.73 0.76 0.81 1.19 0.98 0.75  ?CALCIUM 8.2* 8.1* 8.0* 7.6* 7.5* 7.7*  ?MG 2.3 2.5*  --   --   --   --   ?PHOS 3.8  --   --  5.0*  --   --   ? ?GFR: ?Estimated Creatinine Clearance: 90.3 mL/min (by C-G formula based on SCr of 0.75 mg/dL). ?Liver Function Tests: ?Recent Labs  ?Lab 07/31/21 ?1547 08/03/21 ?0410  ?AST 20  --   ?ALT 15  --   ?ALKPHOS 83  --   ?BILITOT 0.5  --   ?PROT 5.5*  --   ?ALBUMIN 2.3* 1.5*  ? ?Cardiac Enzymes: ?No results for input(s): CKTOTAL, CKMB, CKMBINDEX, TROPONINI in the last 168 hours. ?BNP (last 3 results) ?No results for input(s): PROBNP in the last 8760 hours. ?HbA1C: ?No results for input(s): HGBA1C in the last 72 hours. ? ?Sepsis  Labs: ?'@LABRCNTIP'$ (procalcitonin:4,lacticidven:4) ?) ?Recent Results (from the past 240 hour(s))  ?Surgical PCR screen     Status: None  ? Collection Time: 08/02/21 12:48 AM  ? Specimen: Nasal Mucosa; Nasal Swab  ?Result Value Ref Range Stat

## 2021-08-05 NOTE — Progress Notes (Signed)
[facility] Progress Note ? ?3 Days Post-Op  ?Subjective: ?Got I/O again with 400 out. On Flomax, may end up and need foley replaced it not improving. Ostomy with output over 2L, started metamucil and imodium.  ? ?Objective: ?Vital signs in last 24 hours: ?Temp:  [98.1 ?F (36.7 ?C)-98.5 ?F (36.9 ?C)] 98.5 ?F (36.9 ?C) (04/09 1115) ?Pulse Rate:  [25-114] 91 (04/09 1115) ?Resp:  [10-22] 19 (04/09 1115) ?BP: (85-121)/(56-89) 103/73 (04/09 0930) ?SpO2:  [93 %-100 %] 97 % (04/09 1115) ?Last BM Date :  (ostomy) ? ?Intake/Output from previous day: ?04/08 0701 - 04/09 0700 ?In: 2802.2 [P.O.:1068; I.V.:1734.2] ?Out: 2600 [Urine:400; Stool:2200] ?Intake/Output this shift: ?Total I/O ?In: 172.5 [I.V.:172.5] ?Out: 420 [Stool:420] ? ?General appearance: alert and no distress ?GI: soft, nondistended, staples c/d/I with minimal drainage on inferior edge, ostomy pink and liquid output, edematous  ? ?Lab Results:  ?Recent Labs  ?  08/04/21 ?0323 08/05/21 ?0533  ?WBC 12.7* 8.8  ?HGB 8.2* 8.3*  ?HCT 25.6* 25.1*  ?PLT 447* 411*  ? ?BMET ?Recent Labs  ?  08/04/21 ?0323 08/05/21 ?0533  ?NA 135 136  ?K 4.1 3.7  ?CL 107 107  ?CO2 25 26  ?GLUCOSE 133* 126*  ?BUN 17 14  ?CREATININE 0.98 0.75  ?CALCIUM 7.5* 7.7*  ? ?PT/INR ?No results for input(s): LABPROT, INR in the last 72 hours. ? ?Studies/Results: ?No results found. ? ?Anti-infectives: ?Anti-infectives (From admission, onward)  ? ? Start     Dose/Rate Route Frequency Ordered Stop  ? 08/02/21 1126  sodium chloride 0.9 % with cefoTEtan (CEFOTAN) ADS Med       ?Note to Pharmacy: Jerrye Beavers: cabinet override  ?    08/02/21 1126 08/02/21 1636  ? 08/01/21 1400  neomycin (MYCIFRADIN) tablet 1,000 mg       ?See Hyperspace for full Linked Orders Report.  ? 1,000 mg Oral 3 times per day 08/01/21 0937 08/01/21 2121  ? 08/01/21 1400  metroNIDAZOLE (FLAGYL) tablet 1,000 mg       ?See Hyperspace for full Linked Orders Report.  ? 1,000 mg Oral 3 times per day 08/01/21 0937  08/01/21 2121  ? 08/01/21 1030  cefoTEtan (CEFOTAN) 2 g in sodium chloride 0.9 % 100 mL IVPB  Status:  Discontinued       ? 2 g ?200 mL/hr over 30 Minutes Intravenous On call to O.R. 08/01/21 9924 08/02/21 0559  ? ?  ? ? ?Assessment/Plan: ?Patient s/p subtotal colectomy and ileostomy for cancer. Doing fair. ?PRN For pain ?IS, OOB ?Soft diet ?Ostomy output high ,discussed and gave information on Ostomy Output ?Metamucil daily and Imodium '2mg'$  with meals and qhs ordered, may need to increase to 4 mg ?UOP with retention, may need foley back? On flomax ?Labs reassuring ?SCDs, heparin ?Maybe ready for home in next 48 hours ?RN Ostomy helped patient, will need [facility] RN for ostomy care ?If goes home Monday/ Tuesday, needs to see Dr. Okey Dupre in clinic 4/13 and then me 4/20, if goes later than that I can just see 4/18.  ? ?Ileostomy Output: ? ?Monitor how much output you have from your ileostomy a day and drink at least twice that amount in fluids as you will also have losses from sweat and urine.  ?Example: 1 L output you need 2 L of intake  ? ?If you are having more than 1 liter of output a day, please start these medications in a stepwise fashion. ?Metamucil 34 g packet in 30 mL of  water daily.  ?Imodium 4 mg four times a day; 30 minutes before meals and before bed.  ?Lomotil 2.5 mg four times a day; 30 minutes before meals and before bed.  ?Cholestyramine 4 g four times a day; 30 minutes before meals and before bed.  ?(You will need prescriptions for these medications.) ? ?Diet Changes: ?- Chew food well.  ?- Eat low sugar foods and drinks.  ?- Eat salty foods and add salt to meals and snacks.  ?- Eat smaller more frequent meals and snacks.  ?- Drink fluids ? hour before or after meals, not with food.  ?- Avoid alcohol and caffeine.  ?- Eat more soluble fiber which forms a gel when mixed with water and slows movement.  ?- Good sources of soluble fiber include: peeled sweet potatoes, applesauce, refried beans, wheat and  oat bran.  ? ?Try adding these foods that naturally thicken stool to your meals:  ?- Applesauce  ?- Cream of rice  ?- Peanut butter (creamy)  ?- Bananas  ?- Marshmallows ?- Rice  ?- Cheese  ?- Mashed potatoes  ?- Soda crackers  ?- Tapioca    ? ?If you are still having trouble with output, please let us know in your office [phone]).   ?You may need to adjust these medications as you have less output.  ? ? LOS: 5 days  ? ? ?Virl Cagey ?08/05/2021 ? ?

## 2021-08-05 NOTE — Progress Notes (Signed)
--   Significant event 08/05/2021 ? ?-Patient with tachycardia, palpitations ?-EKG reveals new onset A-fib with RVR ?- ?Give IV Cardizem bolus and start IV Cardizem drip for symptomatic atrial fibrillation ?- ?Hold off on anticoagulation in the postop patient ? ?Roxan Hockey, MD ? ?

## 2021-08-05 NOTE — Consult Note (Signed)
[place] Nurse ostomy follow up ?Notified by patient's Bedside RN B. Damita Dunnings that patient's family took the ordered ostomy supplies home last evening along with educational folder.  ?Requested additional supplies to bedside for nursing staff's (and my associate's) use during this admission. Bedside RN's assistance is greatly appreciated today. ? ?[place] Nurse to bring an additional educational folder with her on Tuesday for continuation of teaching. ? ?Recommend HHRN for continued support of patient and his family as they adjust to new ostomy in the presence of new diagnosis of malignancy and also to monitor for high output, dehydration, and adjust pouching system as stomal edema decreases. If you agree, please order/arrange. ? ?Recommend the [place] outpatient ostomy clinic as an outpatient resource.  If you agree, please fax referral ([phone])   or enter electronically in [place]. ? ?[place] nursing team will follow along with you, and will remain available to this patient, the nursing and medical teams.   ? ?Thank you for involving [place] in this patient's care.  ? ?Maudie Flakes, MSN, RN, [place], [place], CWON-AP, [place]  ?Pager# [phone]  ?

## 2021-08-05 NOTE — TOC Progression Note (Signed)
Transition of Care (TOC) - Progression Note  ? ? ?Patient Details  ?Name: Todd Richardson ?MRN: [id number] ?Date of Birth: 1956-03-22 ? ?Transition of Care (TOC) CM/SW Contact  ?Kerin Salen, RN ?Phone Number: ?08/05/2021, 11:38 AM ? ?Clinical Narrative: Patient NMS for discharge at this time. TOC will continue to track.  ? ? ? ?Expected Discharge Plan: Home/Self Care ?Barriers to Discharge: Continued Medical Work up ? ?Expected Discharge Plan and Services ?Expected Discharge Plan: Home/Self Care ?In-house Referral: Clinical Social Work ?Discharge Planning Services: CM Consult ?  ?Living arrangements for the past 2 months: [place] ?                ?  ?  ?  ?  ?  ?  ?  ?  ?  ?  ? ? ?Social Determinants of Health (SDOH) Interventions ?  ? ?Readmission Risk Interventions ? ?  08/01/2021  ? 12:16 PM  ?Readmission Risk Prevention Plan  ?Transportation Screening Complete  ?Home Care Screening Complete  ?Medication Review (RN CM) Complete  ? ? ?

## 2021-08-06 ENCOUNTER — Encounter (HOSPITAL_COMMUNITY): Payer: Self-pay | Admitting: Internal Medicine

## 2021-08-06 ENCOUNTER — Inpatient Hospital Stay (HOSPITAL_COMMUNITY): Payer: Medicare Other

## 2021-08-06 DIAGNOSIS — R9431 Abnormal electrocardiogram [ECG] [EKG]: Secondary | ICD-10-CM

## 2021-08-06 LAB — ECHOCARDIOGRAM COMPLETE
AR max vel: 2.28 cm2
AV Area VTI: 2.42 cm2
AV Area mean vel: 2.1 cm2
AV Mean grad: 6 mmHg
AV Peak grad: 12.3 mmHg
Ao pk vel: 1.75 m/s
Area-P 1/2: 3.24 cm2
Calc EF: 67.1 %
Height: 68 in
MV VTI: 2.37 cm2
S' Lateral: 2.7 cm
Single Plane A2C EF: 67.2 %
Single Plane A4C EF: 66.8 %
Weight: 2691.38 oz

## 2021-08-06 LAB — TSH: TSH: 2.832 u[IU]/mL (ref 0.350–4.500)

## 2021-08-06 LAB — RENAL FUNCTION PANEL
Albumin: 1.5 g/dL — ABNORMAL LOW (ref 3.5–5.0)
Anion gap: 3 — ABNORMAL LOW (ref 5–15)
BUN: 9 mg/dL (ref 8–23)
CO2: 24 mmol/L (ref 22–32)
Calcium: 7.6 mg/dL — ABNORMAL LOW (ref 8.9–10.3)
Chloride: 110 mmol/L (ref 98–111)
Creatinine, Ser: 0.7 mg/dL (ref 0.61–1.24)
GFR, Estimated: >60 mL/min (ref >60)
Glucose, Bld: 126 mg/dL — ABNORMAL HIGH (ref 70–99)
Phosphorus: 1.5 mg/dL — ABNORMAL LOW (ref 2.5–4.6)
Potassium: 4.5 mmol/L (ref 3.5–5.1)
Sodium: 137 mmol/L (ref 135–145)

## 2021-08-06 LAB — GLUCOSE, CAPILLARY: Glucose-Capillary: 129 mg/dL — ABNORMAL HIGH (ref 70–99)

## 2021-08-06 LAB — MAGNESIUM: Magnesium: 2.2 mg/dL (ref 1.7–2.4)

## 2021-08-06 MED ORDER — K PHOS MONO-SOD PHOS DI & MONO 155-852-130 MG PO TABS
250.0000 mg | ORAL_TABLET | Freq: Three times a day (TID) | ORAL | Status: AC
  Administered 2021-08-06 – 2021-08-08 (×5): 250 mg via ORAL
  Filled 2021-08-06 (×5): qty 1

## 2021-08-06 MED ORDER — BACLOFEN 10 MG PO TABS
5.0000 mg | ORAL_TABLET | Freq: Three times a day (TID) | ORAL | Status: DC | PRN
  Administered 2021-08-06: 5 mg via ORAL
  Filled 2021-08-06: qty 1

## 2021-08-06 MED ORDER — CHLORPROMAZINE HCL 10 MG PO TABS
10.0000 mg | ORAL_TABLET | Freq: Three times a day (TID) | ORAL | Status: DC | PRN
  Administered 2021-08-06: 10 mg via ORAL
  Filled 2021-08-06 (×2): qty 1

## 2021-08-06 NOTE — Progress Notes (Signed)
?PROGRESS NOTE ? ? ?Todd Richardson, is a 66 y.o. male, DOB - 1955-05-17, LFY:[id number] ? ?Admit date - 07/31/2021   Admitting Physician Barton Dubois, MD ? ?Outpatient Primary MD for the patient is [name], MD ? ?LOS - 6 ? ?CC -Abd pain ?    ? ?Brief Narrative:  ? ?66 y.o. male with medical history significant of hypertension, asthma  and fam h/o Colon cancer in his father admitted on 07/31/21 with abdominal pain and bowel obstruction with concerns for cecal and sigmoid malignancy ? ?  ?-Assessment and Plan: ? 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 The mass was circumferential. The mass measured five cm in length. Oozing was present.  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 ?-Postoperative management per general surgery team\ ?-Diet advanced by general surgery team ?--Very high output from ostomy bag with over 1600 mL in last 12 hours ?-Per general surgeon okay to try Metamucil and Imodium to reduce ostomy output to reduce risk for dehydration ?-Unable to discharge home on this time as patient is at very high risk of dehydration given the amount of ostomy output--- Imodium has been increased to 4 g at least  4 times a day, continue Metamucil ?-Ostomy wound RN consult ? ? ?New onset A-fib with RVR ?Echo with EF of 60  to 65% with grade 1 diastolic dysfunction mildly dilated left atrium, no significant aortic or mitral stenosis ?-Magnesium WNL potassium WNL ?-Hypophosphatemia noted ?-Replace phosphorus ?-Continue IV Cardizem drip with plans to transition to p.o. Cardizem in a.m. ?-Hold off on anticoagulation given recent surgery ? ?Thrombocytosis ?--Could be reactive--- resolved ? ?Hyponatremia ?--Suspect dehydration related ?-Sodium normalized with hydration ? ?Leukocytosis ?-Most likely a stress demargination and dehydration/reactive ?-Currently no frank/specific source of infection identified ?-WBC has normalized ? ?Hyperglycemia ?-No prior history of diabetes ?-A1C 5.8 ? ?HTN (hypertension) ?- Blood pressure overall stable ?-In the setting of hypokalemia and dehydration ?-- Continue to hold lisinopril/HCTZ ?- ?Social/Ethics--- plan of care and advanced directives discussed with patient, his brother [name]/[name] present at bedside ?-Patient is a full code ? ?Acute Anemia--- suspect this is related to underlying GI malignancy, hemoglobin currently above 8  ?Received 2 units of PRBC perioperatively on 08/02/2021 ? ?Urinary Retention--Foley removed 08/03/2021 ?-Voiding difficulties noted-- required in and out catheterization ?-Continues to have poor urine output in the setting of very very high output from ostomy bag ?-Continue Flomax, bladder scan with not very significant amount of urine in the bladder ? ?Disposition/Need for in-Hospital Stay- patient unable to be discharged at this time due to --- postoperative patient awaiting return of bowel function and tolerance of oral intake, continue IV fluids for now ?-Possible discharge in 24 to 48 hours if ostomy output improves with less risk for dehydration ?-Hoping to get better control over his atrial fibrillation ? ?Status is: Inpatient  ? ?Disposition: The patient is from: Home ?  Anticipated d/c is to: Home ?             Anticipated d/c date is: 1 day ?              Patient currently is not medically stable to d/c. 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 ?Code Status :  -  Code Status: Full Code  ? ?Family Communication:   (patient is alert, awake and coherent)  ?-Discussed with his brother [name] [name]) at bedside ? ?DVT Prophylaxis  :   - SCDs  heparin injection 5,000 Units Start: 07/31/21 1800 ? ? ?Lab Results  ?Component Value Date  ? PLT 386 08/05/2021  ? ?Inpatient Medications ? ?Scheduled Meds: ? Chlorhexidine Gluconate Cloth  6 each Topical Daily  ? heparin  5,000 Units Subcutaneous Q8H  ? loperamide  4 mg Oral TID WC & HS  ? phosphorus  250 mg Oral TID  ? psyllium  1 packet Oral Daily  ? tamsulosin  0.4 mg Oral BID  ? ?Continuous Infusions: ? sodium chloride 100 mL/hr at 08/06/21 1922  ? diltiazem (CARDIZEM) infusion 5 mg/hr (08/06/21 1800)  ? ?PRN Meds:.acetaminophen **OR** acetaminophen, alum & mag hydroxide-simeth, HYDROmorphone (DILAUDID) injection, ipratropium-albuterol, ondansetron **OR** ondansetron (ZOFRAN) IV, phenol ? ? ?Anti-infectives (From admission, onward)  ? ? Start     Dose/Rate Route Frequency Ordered Stop  ? 08/02/21 1126  sodium chloride 0.9 % with cefoTEtan (CEFOTAN) ADS Med       ?Note to Pharmacy: Jerrye Beavers: cabinet override  ?    08/02/21 1126 08/02/21 1636  ? 08/01/21 1400  neomycin (MYCIFRADIN) tablet 1,000 mg       ?See Hyperspace for full Linked Orders Report.  ? 1,000 mg Oral 3 times per day 08/01/21 0937 08/01/21 2121  ? 08/01/21 1400  metroNIDAZOLE (FLAGYL) tablet 1,000 mg       ?See Hyperspace for full Linked Orders Report.  ? 1,000 mg Oral 3 times per day 08/01/21 0937 08/01/21 2121  ? 08/01/21 1030  cefoTEtan (CEFOTAN) 2 g in sodium chloride 0.9 % 100 mL IVPB  Status:  Discontinued       ? 2 g ?200 mL/hr over 30 Minutes Intravenous On call to O.R. 08/01/21 5366 08/02/21 0559  ? ?  ? ?Subjective: ?Todd Richardson today has no fevers,No chest pain,   ?- ?Continues to have high output from ostomy bag ?-Tachycardia/A-fib with RVR persist  ? ?Objective: ?Vitals:  ? 08/06/21 1627 08/06/21 1640 08/06/21 1700 08/06/21 1800  ?BP:   114/69   ?Pulse: 84 91 98 (!) 101  ?Resp: 13 15 (!) 21 17  ?Temp: 98.1 ?F (36.7 ?C)     ?TempSrc: Oral     ?SpO2: 93% 100% 99% 100%  ?Weight:      ?Height:      ? ? ?Intake/Output Summary (Last 24 hours) at 08/06/2021 1938 ?Last data filed at 08/06/2021 1800 ?Gross per 24 hour  ?Intake 4392.14 ml  ?Output 2720 ml  ?Net 1672.14 ml  ? ?Filed Weights  ? 08/02/21 1825 08/03/21 0433 08/06/21 0545  ?Weight: 72.1 kg 72.8 kg 76.3 kg  ? ? ?Physical Exam ? ?Gen:-Awake, alert, in no acute distress  ?HEENT:- [place].AT, No sclera icterus ?Neck-Supple Neck,No JVD,.  ?Lungs-Fair air movement, no wheezing ?CV- S1, S2 normal, irregularly irregular  ?abd-not distended, appropriate postop tenderness, ileostomy with liquid contents/output ?extremity/Skin:- No  edema,  pedal pulses present  ?Psych-alert and oriented x3, affect is appropriate ?Neuro-generalized weakness, no new focal deficits, no tremors ? ? ?Data Reviewed: I have personally reviewed following labs and imaging studies ? ?CBC: ?Recent Labs  ?Lab 08/02/21 ?0505 08/03/21 ?0410 08/04/21 ?8250 08/05/21 ?0370 08/05/21 ?1957  ?WBC 10.0 16.2* 12.7* 8.8 10.8*  ?HGB 8.4* 9.9* 8.2* 8.3* 8.3*  ?HCT 27.8* 30.3* 25.6* 25.1* 26.4*  ?MCV 93.3 92.7 94.5 93.0 95.0  ?PLT 683* 536* 447* 411* 386  ? ?Basic Metabolic Panel: ?Recent Labs  ?Lab 07/31/21 ?1547 08/01/21 ?0454 08/02/21 ?0505 08/03/21 ?0410 08/04/21 ?4888 08/05/21 ?9169 08/06/21 ?0424  ?NA 133* 132* 134* 136 135 136 137  ?K 3.1* 3.7 3.8 4.3 4.1 3.7 4.5  ?CL 104 102 104 106 107 107 110  ?CO2 '23 24 24 23 25 26 24  '$ ?GLUCOSE 151* 129* 120* 177* 133* 126* 126*  ?BUN '13 12 11 14 17 14 9  '$ ?CREATININE 0.73 0.76 0.81 1.19 0.98 0.75 0.70  ?CALCIUM 8.2* 8.1* 8.0* 7.6* 7.5* 7.7* 7.6*  ?MG 2.3 2.5*  --   --   --   --  2.2  ?PHOS 3.8   --   --  5.0*  --   --  1.5*  ? ?GFR: ?Estimated Creatinine Clearance: 90.3 mL/min (by C-G formula based on SCr of 0.7 mg/dL). ?Liver Function Tests: ?Recent Labs  ?Lab 07/31/21 ?1547 08/03/21 ?0410 08/06/21

## 2021-08-06 NOTE — Progress Notes (Signed)
4 Days Post-Op  ?Subjective: ?Patient has no complaints.  He still in the intensive care unit due to new onset atrial fibrillation.  He is having high ostomy output. ? ?Objective: ?Vital signs in last 24 hours: ?Temp:  [97.5 ?F (36.4 ?C)-98.6 ?F (37 ?C)] 98.1 ?F (36.7 ?C) (04/10 1133) ?Pulse Rate:  [36-146] 81 (04/10 1400) ?Resp:  [9-24] 13 (04/10 1400) ?BP: (88-130)/(47-90) 98/76 (04/10 1400) ?SpO2:  [88 %-100 %] 97 % (04/10 1400) ?Weight:  [76.3 kg] 76.3 kg (04/10 0545) ?Last BM Date :  (ostomy) ? ?Intake/Output from previous day: ?04/09 0701 - 04/10 0700 ?In: 3485.5 [P.O.:480; I.V.:2005.5; IV Piggyback:1000] ?Out: 3270 [Urine:375; LMBEM:7544] ?Intake/Output this shift: ?Total I/O ?In: 998.5 [P.O.:360; I.V.:638.5] ?Out: 300 [Stool:300] ? ?General appearance: alert, cooperative, and no distress ?GI: Abdomen soft, incision healing well. Ostomy pink, patent. ? ?Lab Results:  ?Recent Labs  ?  08/05/21 ?9201 08/05/21 ?1957  ?WBC 8.8 10.8*  ?HGB 8.3* 8.3*  ?HCT 25.1* 26.4*  ?PLT 411* 386  ? ?BMET ?Recent Labs  ?  08/05/21 ?0071 08/06/21 ?0424  ?NA 136 137  ?K 3.7 4.5  ?CL 107 110  ?CO2 26 24  ?GLUCOSE 126* 126*  ?BUN 14 9  ?CREATININE 0.75 0.70  ?CALCIUM 7.7* 7.6*  ? ?PT/INR ?No results for input(s): LABPROT, INR in the last 72 hours. ? ?Studies/Results: ?No results found. ? ?Anti-infectives: ?Anti-infectives (From admission, onward)  ? ? Start     Dose/Rate Route Frequency Ordered Stop  ? 08/02/21 1126  sodium chloride 0.9 % with cefoTEtan (CEFOTAN) ADS Med       ?Note to Pharmacy: Jerrye Beavers: cabinet override  ?    08/02/21 1126 08/02/21 1636  ? 08/01/21 1400  neomycin (MYCIFRADIN) tablet 1,000 mg       ?See Hyperspace for full Linked Orders Report.  ? 1,000 mg Oral 3 times per day 08/01/21 0937 08/01/21 2121  ? 08/01/21 1400  metroNIDAZOLE (FLAGYL) tablet 1,000 mg       ?See Hyperspace for full Linked Orders Report.  ? 1,000 mg Oral 3 times per day 08/01/21 0937 08/01/21 2121  ? 08/01/21 1030  cefoTEtan  (CEFOTAN) 2 g in sodium chloride 0.9 % 100 mL IVPB  Status:  Discontinued       ? 2 g ?200 mL/hr over 30 Minutes Intravenous On call to O.R. 08/01/21 2197 08/02/21 0559  ? ?  ? ? ?Assessment/Plan: ?s/p Procedure(s): ?En bloc Resection of Colon and Small bowel ?ILEOSTOMY ?Impression: Patient still in atrial fibrillation.  This is being addressed by Dr. Denton Brick.  Still with high output ileostomy, on imodium.  Hypophosphotemia seen and being addressed. ?May discharge once atrial fibrillation addressed.  Will monitor electrolytes. ? LOS: 6 days  ? ? ?Aviva Signs ?08/06/2021  ?

## 2021-08-06 NOTE — Plan of Care (Signed)
Patient remains on diltiazem infusion this AM. Now in NSR. ? ? ?Problem: Education: ?Goal: Knowledge of General Education information will improve ?Description: Including pain rating scale, medication(s)/side effects and non-pharmacologic comfort measures ?Outcome: Progressing ?  ?Problem: Health Behavior/Discharge Planning: ?Goal: Ability to manage health-related needs will improve ?Outcome: Progressing ?  ?Problem: Clinical Measurements: ?Goal: Ability to maintain clinical measurements within normal limits will improve ?Outcome: Progressing ?Goal: Will remain free from infection ?Outcome: Progressing ?Goal: Diagnostic test results will improve ?Outcome: Progressing ?Goal: Respiratory complications will improve ?Outcome: Progressing ?Goal: Cardiovascular complication will be avoided ?Outcome: Progressing ?  ?Problem: Activity: ?Goal: Risk for activity intolerance will decrease ?Outcome: Progressing ?  ?Problem: Nutrition: ?Goal: Adequate nutrition will be maintained ?Outcome: Progressing ?  ?Problem: Coping: ?Goal: Level of anxiety will decrease ?Outcome: Progressing ?  ?Problem: Elimination: ?Goal: Will not experience complications related to bowel motility ?Outcome: Progressing ?Goal: Will not experience complications related to urinary retention ?Outcome: Progressing ?  ?Problem: Pain Managment: ?Goal: General experience of comfort will improve ?Outcome: Progressing ?  ?Problem: Safety: ?Goal: Ability to remain free from injury will improve ?Outcome: Progressing ?  ?Problem: Skin Integrity: ?Goal: Risk for impaired skin integrity will decrease ?Outcome: Progressing ?  ?

## 2021-08-06 NOTE — TOC Progression Note (Signed)
Transition of Care (TOC) - Progression Note  ? ? ?Patient Details  ?Name: Todd Richardson ?MRN: [id number] ?Date of Birth: 10-Mar-1956 ? ?Transition of Care (TOC) CM/SW Contact  ?Salome Arnt, LCSW ?Phone Number: ?08/06/2021, 1:45 PM ? ?Clinical Narrative:   LCSW followed up with pt about home health RN. Pt states he may be going to his nephew's house in [place] for a few days after d/c. He is not sure if he needs HHRN. Pt requested that TOC follow up with him tomorrow regarding where he will be going and to discuss [facility] further. TOC requested MD refer to [facility].   ? ? ? ?Expected Discharge Plan: Home/Self Care ?Barriers to Discharge: Continued Medical Work up ? ?Expected Discharge Plan and Services ?Expected Discharge Plan: Home/Self Care ?In-house Referral: Clinical Social Work ?Discharge Planning Services: CM Consult ?  ?Living arrangements for the past 2 months: [place] ?                ?  ?  ?  ?  ?  ?  ?  ?  ?  ?  ? ? ?Social Determinants of Health (SDOH) Interventions ?  ? ?Readmission Risk Interventions ? ?  08/01/2021  ? 12:16 PM  ?Readmission Risk Prevention Plan  ?Transportation Screening Complete  ?Home Care Screening Complete  ?Medication Review (RN CM) Complete  ? ? ?

## 2021-08-06 NOTE — Progress Notes (Signed)
*  PRELIMINARY RESULTS* ?Echocardiogram ?2D Echocardiogram has been performed. ? ?Todd Richardson ?08/06/2021, 3:18 PM ?

## 2021-08-07 ENCOUNTER — Encounter (HOSPITAL_COMMUNITY): Payer: Self-pay | Admitting: General Surgery

## 2021-08-07 ENCOUNTER — Ambulatory Visit: Payer: Medicare Other | Admitting: Surgery

## 2021-08-07 MED ORDER — CALCIUM CARBONATE ANTACID 500 MG PO CHEW
1.0000 | CHEWABLE_TABLET | Freq: Three times a day (TID) | ORAL | Status: DC
  Administered 2021-08-07 – 2021-08-09 (×7): 200 mg via ORAL
  Filled 2021-08-07 (×7): qty 1

## 2021-08-07 MED ORDER — PANTOPRAZOLE SODIUM 40 MG PO TBEC
40.0000 mg | DELAYED_RELEASE_TABLET | Freq: Every day | ORAL | Status: DC
  Administered 2021-08-07 – 2021-08-08 (×2): 40 mg via ORAL
  Filled 2021-08-07 (×2): qty 1

## 2021-08-07 MED ORDER — DILTIAZEM HCL ER COATED BEADS 120 MG PO CP24
120.0000 mg | ORAL_CAPSULE | Freq: Every day | ORAL | Status: DC
  Administered 2021-08-07 – 2021-08-09 (×3): 120 mg via ORAL
  Filled 2021-08-07 (×3): qty 1

## 2021-08-07 NOTE — Progress Notes (Signed)
?PROGRESS NOTE ? ? ?Todd Richardson, is a 66 y.o. male, DOB - [birth year], ERX:[id number] ? ?Admit date - 07/31/2021   Admitting Physician Barton Dubois, MD ? ?Outpatient Primary MD for the patient is [name], MD ? ?LOS - 7 ? ?CC -Abd pain ?    ? ?Brief Narrative:  ? ?66 y.o. male with medical history significant of hypertension, asthma  and fam h/o Colon cancer in his father admitted on 07/31/21 with abdominal pain and bowel obstruction with concerns for cecal and sigmoid malignancy ?  ?-Assessment and Plan: ? 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 The mass was circumferential. The mass measured five cm in length. Oozing was present.  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 ?-Postoperative management per general surgery team\ ?-Diet advanced by general surgery team ?--Very high output from ostomy bag with over 1600 mL in last 12 hours ?-Per general surgeon okay to try Metamucil and Imodium to reduce ostomy output to reduce risk for dehydration ?-Unable to discharge home on this time as patient is at very high risk of dehydration given the amount of ostomy output--- Imodium has been increased to 4 g at least  4 times a day, continue Metamucil ?-Ostomy wound RN consult ?-Very high output from ostomy bag despite Imodium  and Metamucil ?-High risk for dehydration ?-Urinary retention requiring in and out now #indwelling Foley ?-Anticipate discharge home when ostomy output is more reasonable, he will go home with Flomax and Foley and have outpatient urology follow-up for voiding trial ? ?New onset A-fib with RVR ?Echo with EF of 60 to 65% with grade 1 diastolic dysfunction mildly dilated left atrium, no significant aortic or mitral stenosis ?-Magnesium WNL potassium WNL ?-Hypophosphatemia noted--replaced ?-Weaned off IV Cardizem, transition to oral Cardizem on 08/07/2021 ?-Hold off on anticoagulation given recent surgery ? ?Thrombocytosis ?--Could be reactive--- resolved ? ?Hyponatremia ?--Suspect dehydration related ?-Sodium normalized with hydration ? ?Leukocytosis ?-Most likely a stress demargination and dehydration/reactive ?-Currently no frank/specific source of infection identified ?-WBC has normalized ? ?Hyperglycemia ?-No prior history of diabetes ?-A1C 5.8 ? ?HTN (hypertension) ?- Blood pressure overall stable ?-In the setting of hypokalemia and dehydration ?-- Continue to hold lisinopril/HCTZ ?- ?Social/Ethics--- plan of care and advanced directives discussed with patient, his brother Todd Richardson present at bedside ?-Patient is a full code ? ?Acute Anemia--- suspect this is related to underlying GI malignancy, hemoglobin currently above 8  ?Received 2 units of PRBC perioperatively on 08/02/2021 ? ?Urinary Retention--Foley removed 08/03/2021 ?-Voiding difficulties noted-- required in and out catheterization ?-Continues to have poor urine output in the setting of very very high output from ostomy bag ?-Urinary retention requiring in and out now #indwelling Foley ?indwelling Foley catheter placed on 08/07/2021 due to recurrent urinary retention requiring in and out catheterization despite Flomax use ?-Anticipate he will need to keep Foley in and be discharged on Flomax and follow-up with urology as outpatient for voiding  trial ? ?Disposition/Need for in-Hospital Stay- patient unable to be discharged at this time due to --- - continue IV fluids for now ?-Possible discharge in 24 to 48 hours if ostomy output improves with less risk for dehydration ? ?Status is: Inpatient  ? ?Disposition: The patient is from: Home ?             Anticipated d/c is to: Home ?             Anticipated d/c date is: 1 day ?             Patient currently is not medically stable to d/c. ?Barriers: Not Clinically Stable-  ?- ?Procedures ?-Colonoscopy 07/31/2021 ?08/02/21--had S/p  subtotal colectomy and portion of terminal ileum out, and  ileostomy ?-Foley catheter on 08/07/2021 ? ?Code Status :  -  Code Status: Full Code  ? ?Family Communication:   (patient is alert, awake and coherent)  ?-Discussed with his brother Todd Richardson) at bedside ? ?DVT Prophylaxis  :   - SCDs  heparin injection 5,000 Units Start: 07/31/21 1800 ? ? ?Lab Results  ?Component Value Date  ? PLT 386 08/05/2021  ? ?Inpatient Medications ? ?Scheduled Meds: ? calcium carbonate  1 tablet Oral TID WC  ? Chlorhexidine Gluconate Cloth  6 each Topical Daily  ? diltiazem  120 mg Oral Daily  ? heparin  5,000 Units Subcutaneous Q8H  ? loperamide  4 mg Oral TID WC & HS  ? pantoprazole  40 mg Oral Daily  ? phosphorus  250 mg Oral TID  ? psyllium  1 packet Oral Daily  ? tamsulosin  0.4 mg Oral BID  ? ?Continuous Infusions: ? sodium chloride 100 mL/hr at 08/06/21 1922  ? diltiazem (CARDIZEM) infusion Stopped (08/07/21 1414)  ? ?PRN Meds:.acetaminophen **OR** acetaminophen, alum & mag hydroxide-simeth, baclofen, HYDROmorphone (DILAUDID) injection, ipratropium-albuterol, ondansetron **OR** ondansetron (ZOFRAN) IV, phenol ? ? ?Anti-infectives (From admission, onward)  ? ? Start     Dose/Rate Route Frequency Ordered Stop  ? 08/02/21 1126  sodium chloride 0.9 % with cefoTEtan (CEFOTAN) ADS Med       ?Note to Pharmacy: Jerrye Beavers: cabinet override  ?    08/02/21 1126 08/02/21 1636  ? 08/01/21 1400   neomycin (MYCIFRADIN) tablet 1,000 mg       ?See Hyperspace for full Linked Orders Report.  ? 1,000 mg Oral 3 times per day 08/01/21 0937 08/01/21 2121  ? 08/01/21 1400  metroNIDAZOLE (FLAGYL) tablet 1,000 mg       ?See Hyperspace for full Linked Orders Report.  ? 1,000 mg Oral 3 times per day 08/01/21 0937 08/01/21 2121  ? 08/01/21 1030  cefoTEtan (CEFOTAN) 2 g in sodium chloride 0.9 % 100 mL IVPB  Status:  Discontinued       ? 2 g ?200 mL/hr over 30 Minutes Intravenous On call to O.R. 08/01/21 9924 08/02/21 0559  ? ?  ? ?Subjective: ?Todd Richardson today has no fevers,No chest pain,   ?-Eating and drinking well ? ?-Very high output from ostomy bag despite Imodium and Metamucil ?-High risk for dehydration ?-Urinary retention requiring in and out now #indwelling Foley ?indwelling Foley catheter placed on 08/07/2021 due to recurrent urinary retention requiring in and out catheterization despite Flomax use ?- ? ?Objective: ?Vitals:  ? 08/07/21 1100 08/07/21 1200 08/07/21 1400 08/07/21 1415  ?BP: 97/73 110/70 (!) 87/60 94/61  ?Pulse: 73 86 (!) 103 96  ?Resp: 11 15 (!) 21 17  ?Temp:  98.3 ?F (  36.8 ?C)    ?TempSrc:  Oral    ?SpO2: 98% 100% 92% 98%  ?Weight:      ?Height:      ? ? ?Intake/Output Summary (Last 24 hours) at 08/07/2021 1452 ?Last data filed at 08/07/2021 1300 ?Gross per 24 hour  ?Intake 3698.99 ml  ?Output 3020 ml  ?Net 678.99 ml  ? ?Filed Weights  ? 08/03/21 0433 08/06/21 0545 08/07/21 0600  ?Weight: 72.8 kg 76.3 kg 77.9 kg  ? ?Physical Exam ?Gen:-Awake, alert, in no acute distress  ?HEENT:- [place].AT, No sclera icterus ?Neck-Supple Neck,No JVD,.  ?Lungs-Fair air movement, no wheezing ?CV- S1, S2 normal, irregular but not irregularly irregular ?abd-not distended, appropriate postop tenderness, ileostomy with liquid contents/output ?extremity/Skin:- No  edema, pedal pulses present  ?Psych-alert and oriented x3, affect is appropriate ?Neuro-generalized weakness, no new focal deficits, no tremors ?GU-indwelling Foley  catheter placed on 08/07/2021 due to recurrent urinary retention requiring in and out catheterization despite Flomax use ? ?Data Reviewed: I have personally reviewed following labs and imaging studies ? ?CBC: ?Recent Labs

## 2021-08-07 NOTE — Consult Note (Signed)
[facility] Nurse ostomy follow up ?Stoma type/location: RLQ, end ileostomy  ?Stomal assessment/size: 1 1/2" round, budded, pink, moist ?Peristomal assessment: intact  ?Treatment options for stomal/peristomal skin: 2" skin barrier ring ?Output liquid green ?Ostomy pouching: 2pc. Liquid green stool, +flatus ?Education provided:  ?Met with patient's nephew and his significant other; they will care for patient after discharge. ?Patient's ostomy pouch is overfilled and filled with gas, I have demonstrated how to "burp gas" from 2pc pouch ?Demonstrated pouch change again with [place], she watched on Saturday ?Stressed importance to patient on sensation of pouch filling and need to empty and letting someone know that it needs to be emptying. Ideally patient will be up and moving and can empty his pouch, discussed this with patient and family ?Had CG cut new skin barrier, she also closed lock and roll closure independently ?Removed pouch and cleansed skin, reminded patient and CG's to have everything ready to go when they change pouch prior to removal of old pouch. Discussed pouch change frequency of 2x week. ?Placed new barrier ring and skin barrier, attached new pouch to skin barrier.  ?Answered questions from patient and family, pouch change frequency again ?Stressed importance of hydration and each time we empty the pouch he needs to drink 8oz of fluid ?Discussed importance of use of Immodium OTC to keep output thicker and to lessen output/risk of dehydration ?Ok to shower, ok to travel. Discussed SS discharge program and expectation of this program ?Patient has Medicare and supplement, answered questions related to coverage ?Plans for another teaching visit with patient and CGs for Thursday  ?Enrolled patient in [place] Start Discharge program: Yes ? ?[facility] Nurse will follow along with you for continued support with ostomy teaching and care ?Nkechi Linehan Liane Comber MSN, RN, [place], [place], [place] ?[phone]  ?

## 2021-08-07 NOTE — Plan of Care (Signed)

## 2021-08-07 NOTE — Progress Notes (Signed)
5 Days Post-Op  ?Subjective: ?Patient has no complaints. ? ?Objective: ?Vital signs in last 24 hours: ?Temp:  [97.7 ?F (36.5 ?C)-98.7 ?F (37.1 ?C)] 98.7 ?F (37.1 ?C) (04/11 9373) ?Pulse Rate:  [75-101] 79 (04/11 0800) ?Resp:  [12-24] 13 (04/11 0800) ?BP: (84-120)/(60-84) 103/74 (04/11 0800) ?SpO2:  [91 %-100 %] 98 % (04/11 0800) ?Weight:  [77.9 kg] 77.9 kg (04/11 0600) ?Last BM Date :  (ostomy) ? ?Intake/Output from previous day: ?04/10 0701 - 04/11 0700 ?In: 3700.1 [P.O.:1410; I.V.:2290.1] ?Out: 1920 [Urine:1210; Stool:710] ?Intake/Output this shift: ?Total I/O ?In: -  ?Out: 800 [Stool:800] ? ?General appearance: alert, cooperative, and no distress ?GI: Soft, incision healing well.  Ileostomy pink and patent. ? ?Lab Results:  ?Recent Labs  ?  08/05/21 ?4287 08/05/21 ?1957  ?WBC 8.8 10.8*  ?HGB 8.3* 8.3*  ?HCT 25.1* 26.4*  ?PLT 411* 386  ? ?BMET ?Recent Labs  ?  08/05/21 ?6811 08/06/21 ?0424  ?NA 136 137  ?K 3.7 4.5  ?CL 107 110  ?CO2 26 24  ?GLUCOSE 126* 126*  ?BUN 14 9  ?CREATININE 0.75 0.70  ?CALCIUM 7.7* 7.6*  ? ?PT/INR ?No results for input(s): LABPROT, INR in the last 72 hours. ? ?Studies/Results: ?ECHOCARDIOGRAM COMPLETE ? ?Result Date: 08/06/2021 ?   ECHOCARDIOGRAM REPORT   Patient Name:   Todd Richardson Date of Exam: 08/06/2021 Medical Rec #:  [id number]     Height:       68.0 in Accession #:    [id number]    Weight:       168.2 lb Date of Birth:  25-Jun-1955     BSA:          1.899 m? Patient Age:    66 years      BP:           98/76 mmHg Patient Gender: M             HR:           91 bpm. Exam Location:  [facility] Procedure: 2D Echo, Cardiac Doppler and Color Doppler Indications:    Abnormal ECG  History:        Patient has no prior history of Echocardiogram examinations.                 Risk Factors:Hypertension. Colon CA. Images by Lonn Georgia, student.  Sonographer:    DBB Referring Phys: [id number] COURAGE EMOKPAE IMPRESSIONS  1. Left ventricular ejection fraction, by estimation, is 60 to 65%. The left ventricle has  normal function. The left ventricle has no regional wall motion abnormalities. Left ventricular diastolic parameters are consistent with Grade I diastolic dysfunction (impaired relaxation).  2. Right ventricular systolic function is normal. The right ventricular size is normal.  3. Left atrial size was mildly dilated.  4. The mitral valve is abnormal. No evidence of mitral valve regurgitation. No evidence of mitral stenosis. Moderate mitral annular calcification.  5. The aortic valve is tricuspid. There is moderate calcification of the aortic valve. There is moderate thickening of the aortic valve. Aortic valve regurgitation is not visualized. Aortic valve sclerosis is present, with no evidence of aortic valve stenosis.  6. The inferior vena cava is normal in size with greater than 50% respiratory variability, suggesting right atrial pressure of 3 mmHg. FINDINGS  Left Ventricle: Left ventricular ejection fraction, by estimation, is 60 to 65%. The left ventricle has normal function. The left ventricle has no regional wall motion abnormalities. The left ventricular internal cavity size was  normal in size. There is  no left ventricular hypertrophy. Left ventricular diastolic parameters are consistent with Grade I diastolic dysfunction (impaired relaxation). Right Ventricle: The right ventricular size is normal. No increase in right ventricular wall thickness. Right ventricular systolic function is normal. Left Atrium: Left atrial size was mildly dilated. Right Atrium: Right atrial size was normal in size. Pericardium: There is no evidence of pericardial effusion. Mitral Valve: The mitral valve is abnormal. Moderate mitral annular calcification. No evidence of mitral valve regurgitation. No evidence of mitral valve stenosis. MV peak gradient, 4.6 mmHg. The mean mitral valve gradient is 2.0 mmHg. Tricuspid Valve: The tricuspid valve is normal in structure. Tricuspid valve regurgitation is not demonstrated. No evidence of  tricuspid stenosis. Aortic Valve: The aortic valve is tricuspid. There is moderate calcification of the aortic valve. There is moderate thickening of the aortic valve. Aortic valve regurgitation is not visualized. Aortic valve sclerosis is present, with no evidence of aortic valve stenosis. Aortic valve mean gradient measures 6.0 mmHg. Aortic valve peak gradient measures 12.2 mmHg. Aortic valve area, by VTI measures 2.42 cm?. Pulmonic Valve: The pulmonic valve was normal in structure. Pulmonic valve regurgitation is not visualized. No evidence of pulmonic stenosis. Aorta: The aortic root is normal in size and structure. Venous: The inferior vena cava is normal in size with greater than 50% respiratory variability, suggesting right atrial pressure of 3 mmHg. IAS/Shunts: No atrial level shunt detected by color flow Doppler.  LEFT VENTRICLE PLAX 2D LVIDd:         4.30 cm     Diastology LVIDs:         2.70 cm     LV e' medial:    6.42 cm/s LV PW:         1.10 cm     LV E/e' medial:  11.2 LV IVS:        1.10 cm     LV e' lateral:   9.14 cm/s LVOT diam:     2.00 cm     LV E/e' lateral: 7.9 LV SV:         79 LV SV Index:   41 LVOT Area:     3.14 cm?  LV Volumes (MOD) LV vol d, MOD A2C: 59.5 ml LV vol d, MOD A4C: 75.8 ml LV vol s, MOD A2C: 19.5 ml LV vol s, MOD A4C: 25.2 ml LV SV MOD A2C:     40.0 ml LV SV MOD A4C:     75.8 ml LV SV MOD BP:      45.5 ml RIGHT VENTRICLE RV Basal diam:  3.30 cm RV Mid diam:    2.90 cm RV S prime:     14.70 cm/s TAPSE (M-mode): 2.6 cm LEFT ATRIUM             Index        RIGHT ATRIUM           Index LA diam:        3.90 cm 2.05 cm/m?   RA Area:     14.00 cm? LA Vol (A2C):   27.8 ml 14.64 ml/m?  RA Volume:   38.30 ml  20.17 ml/m? LA Vol (A4C):   38.8 ml 20.43 ml/m? LA Biplane Vol: 33.2 ml 17.48 ml/m?  AORTIC VALVE                     PULMONIC VALVE AV Area (Vmax):    2.28 cm?  PV Vmax:       0.90 m/s AV Area (Vmean):   2.10 cm?      PV Peak grad:  3.2 mmHg AV Area (VTI):     2.42 cm? AV  Vmax:           175.00 cm/s AV Vmean:          113.000 cm/s AV VTI:            0.325 m AV Peak Grad:      12.2 mmHg AV Mean Grad:      6.0 mmHg LVOT Vmax:         127.00 cm/s LVOT Vmean:        75.700 cm/s LVOT VTI:          0.250 m LVOT/AV VTI ratio: 0.77  AORTA Ao Root diam: 3.10 cm Ao Asc diam:  3.40 cm MITRAL VALVE MV Area (PHT): 3.24 cm?    SHUNTS MV Area VTI:   2.37 cm?    Systemic VTI:  0.25 m MV Peak grad:  4.6 mmHg    Systemic Diam: 2.00 cm MV Mean grad:  2.0 mmHg MV Vmax:       1.07 m/s MV Vmean:      65.1 cm/s MV Decel Time: 234 msec MV E velocity: 72.00 cm/s MV A velocity: 89.50 cm/s MV E/A ratio:  0.80 Mayli Covington Rouge MD Electronically signed by Emmit Oriley Rouge MD Signature Date/Time: 08/06/2021/4:29:06 PM    Final    ? ?Anti-infectives: ?Anti-infectives (From admission, onward)  ? ? Start     Dose/Rate Route Frequency Ordered Stop  ? 08/02/21 1126  sodium chloride 0.9 % with cefoTEtan (CEFOTAN) ADS Med       ?Note to Pharmacy: Jerrye Beavers: cabinet override  ?    08/02/21 1126 08/02/21 1636  ? 08/01/21 1400  neomycin (MYCIFRADIN) tablet 1,000 mg       ?See Hyperspace for full Linked Orders Report.  ? 1,000 mg Oral 3 times per day 08/01/21 0937 08/01/21 2121  ? 08/01/21 1400  metroNIDAZOLE (FLAGYL) tablet 1,000 mg       ?See Hyperspace for full Linked Orders Report.  ? 1,000 mg Oral 3 times per day 08/01/21 0937 08/01/21 2121  ? 08/01/21 1030  cefoTEtan (CEFOTAN) 2 g in sodium chloride 0.9 % 100 mL IVPB  Status:  Discontinued       ? 2 g ?200 mL/hr over 30 Minutes Intravenous On call to O.R. 08/01/21 4193 08/02/21 0559  ? ?  ? ? ?Assessment/Plan: ?s/p Procedure(s): ?En bloc Resection of Colon and Small bowel ?ILEOSTOMY ?Impression: Patient still on Cardizem drip for new onset atrial fibrillation.  Otherwise, he feels well.  Has had difficulty voiding and now has a Foley catheter.  Ileostomy output still elevated, though nursing did not give his morning Imodium due to concern of decreased output.  I did  reiterate that he needs to stay on the Imodium to control his ileostomy output.  Patient is being switched off of Cardizem drip to p.o. medications. ? LOS: 7 days  ? ? ?Aviva Signs ?08/07/2021  ?

## 2021-08-07 NOTE — Progress Notes (Signed)
Patient level of care downgraded to telemetry and new bed assignment of AP 300- room 307. Report called to [place]. LPN who will take over patient care. Handoff completed.  ?Patient transported to new room 307 by [facility] RN Alyse Low.  ? ? ?

## 2021-08-08 ENCOUNTER — Other Ambulatory Visit (INDEPENDENT_AMBULATORY_CARE_PROVIDER_SITE_OTHER): Payer: Medicare Other | Admitting: General Surgery

## 2021-08-08 DIAGNOSIS — I4891 Unspecified atrial fibrillation: Secondary | ICD-10-CM | POA: Clinically undetermined

## 2021-08-08 DIAGNOSIS — C189 Malignant neoplasm of colon, unspecified: Secondary | ICD-10-CM

## 2021-08-08 DIAGNOSIS — K56609 Unspecified intestinal obstruction, unspecified as to partial versus complete obstruction: Secondary | ICD-10-CM

## 2021-08-08 LAB — SURGICAL PATHOLOGY

## 2021-08-08 MED ORDER — TAMSULOSIN HCL 0.4 MG PO CAPS: 0.4000 mg | ORAL_CAPSULE | Freq: Two times a day (BID) | ORAL | 1 refills | Status: DC

## 2021-08-08 MED ORDER — APIXABAN 5 MG PO TABS
5.0000 mg | ORAL_TABLET | Freq: Two times a day (BID) | ORAL | 1 refills | Status: DC
Start: 2021-08-09 — End: 2021-08-31

## 2021-08-08 MED ORDER — CALCIUM CARBONATE ANTACID 500 MG PO CHEW
1.0000 | CHEWABLE_TABLET | Freq: Three times a day (TID) | ORAL | 0 refills | Status: AC
Start: 2021-08-08 — End: 2021-09-07

## 2021-08-08 MED ORDER — PSYLLIUM 95 % PO PACK: 1.0000 | PACK | Freq: Every day | ORAL | 2 refills | Status: AC

## 2021-08-08 MED ORDER — K PHOS MONO-SOD PHOS DI & MONO 155-852-130 MG PO TABS: 250.0000 mg | ORAL_TABLET | Freq: Three times a day (TID) | ORAL | 0 refills | Status: AC

## 2021-08-08 MED ORDER — SIMETHICONE 80 MG PO CHEW
80.0000 mg | CHEWABLE_TABLET | Freq: Four times a day (QID) | ORAL | Status: DC
  Administered 2021-08-08 – 2021-08-09 (×5): 80 mg via ORAL
  Filled 2021-08-08 (×5): qty 1

## 2021-08-08 MED ORDER — LOPERAMIDE HCL 2 MG PO CAPS: 4.0000 mg | ORAL_CAPSULE | Freq: Three times a day (TID) | ORAL | 1 refills | Status: AC

## 2021-08-08 MED ORDER — DILTIAZEM HCL ER COATED BEADS 120 MG PO CP24: 120.0000 mg | ORAL_CAPSULE | Freq: Every day | ORAL | 1 refills | Status: DC

## 2021-08-08 MED ORDER — PANTOPRAZOLE SODIUM 40 MG PO TBEC
40.0000 mg | DELAYED_RELEASE_TABLET | Freq: Two times a day (BID) | ORAL | Status: DC
  Administered 2021-08-08 – 2021-08-09 (×3): 40 mg via ORAL
  Filled 2021-08-08 (×3): qty 1

## 2021-08-08 MED ORDER — PANTOPRAZOLE SODIUM 40 MG PO TBEC
40.0000 mg | DELAYED_RELEASE_TABLET | Freq: Every day | ORAL | 1 refills | Status: DC
Start: 2021-08-09 — End: 2021-11-05

## 2021-08-08 NOTE — Progress Notes (Signed)
6 Days Post-Op  ?Subjective: ?Patient has no complaints. ? ?Objective: ?Vital signs in last 24 hours: ?Temp:  [97.4 ?F (36.3 ?C)-98.3 ?F (36.8 ?C)] 98 ?F (36.7 ?C) (04/12 0526) ?Pulse Rate:  [73-104] 83 (04/12 0526) ?Resp:  [11-21] 19 (04/12 0526) ?BP: (87-113)/(57-87) 98/70 (04/12 0526) ?SpO2:  [86 %-100 %] 86 % (04/12 0526) ?Last BM Date : 08/07/21 ? ?Intake/Output from previous day: ?04/11 0701 - 04/12 0700 ?In: 1002.8 [P.O.:360; I.V.:642.8] ?Out: 0109 [Stool:1550] ?Intake/Output this shift: ?No intake/output data recorded. ? ?General appearance: alert, cooperative, and no distress ?GI: Soft, incision healing well.  Ileostomy pink and patent. ? ?Lab Results:  ?Recent Labs  ?  08/05/21 ?1957  ?WBC 10.8*  ?HGB 8.3*  ?HCT 26.4*  ?PLT 386  ? ?BMET ?Recent Labs  ?  08/06/21 ?0424  ?NA 137  ?K 4.5  ?CL 110  ?CO2 24  ?GLUCOSE 126*  ?BUN 9  ?CREATININE 0.70  ?CALCIUM 7.6*  ? ?PT/INR ?No results for input(s): LABPROT, INR in the last 72 hours. ? ?Studies/Results: ?ECHOCARDIOGRAM COMPLETE ? ?Result Date: 08/06/2021 ?   ECHOCARDIOGRAM REPORT   Patient Name:   Todd Richardson Date of Exam: 08/06/2021 Medical Rec #:  [id number]     Height:       68.0 in Accession #:    [id number]    Weight:       168.2 lb Date of Birth:  [birth year]     BSA:          1.899 m? Patient Age:    66 years      BP:           98/76 mmHg Patient Gender: M             HR:           91 bpm. Exam Location:  [facility] Procedure: 2D Echo, Cardiac Doppler and Color Doppler Indications:    Abnormal ECG  History:        Patient has no prior history of Echocardiogram examinations.                 Risk Factors:Hypertension. Colon CA. Images by Lonn Georgia, student.  Sonographer:    DBB Referring Phys: [id number] COURAGE EMOKPAE IMPRESSIONS  1. Left ventricular ejection fraction, by estimation, is 60 to 65%. The left ventricle has normal function. The left ventricle has no regional wall motion abnormalities. Left ventricular diastolic parameters are consistent with Grade I  diastolic dysfunction (impaired relaxation).  2. Right ventricular systolic function is normal. The right ventricular size is normal.  3. Left atrial size was mildly dilated.  4. The mitral valve is abnormal. No evidence of mitral valve regurgitation. No evidence of mitral stenosis. Moderate mitral annular calcification.  5. The aortic valve is tricuspid. There is moderate calcification of the aortic valve. There is moderate thickening of the aortic valve. Aortic valve regurgitation is not visualized. Aortic valve sclerosis is present, with no evidence of aortic valve stenosis.  6. The inferior vena cava is normal in size with greater than 50% respiratory variability, suggesting right atrial pressure of 3 mmHg. FINDINGS  Left Ventricle: Left ventricular ejection fraction, by estimation, is 60 to 65%. The left ventricle has normal function. The left ventricle has no regional wall motion abnormalities. The left ventricular internal cavity size was normal in size. There is  no left ventricular hypertrophy. Left ventricular diastolic parameters are consistent with Grade I diastolic dysfunction (impaired relaxation). Right Ventricle: The right ventricular size is  normal. No increase in right ventricular wall thickness. Right ventricular systolic function is normal. Left Atrium: Left atrial size was mildly dilated. Right Atrium: Right atrial size was normal in size. Pericardium: There is no evidence of pericardial effusion. Mitral Valve: The mitral valve is abnormal. Moderate mitral annular calcification. No evidence of mitral valve regurgitation. No evidence of mitral valve stenosis. MV peak gradient, 4.6 mmHg. The mean mitral valve gradient is 2.0 mmHg. Tricuspid Valve: The tricuspid valve is normal in structure. Tricuspid valve regurgitation is not demonstrated. No evidence of tricuspid stenosis. Aortic Valve: The aortic valve is tricuspid. There is moderate calcification of the aortic valve. There is moderate  thickening of the aortic valve. Aortic valve regurgitation is not visualized. Aortic valve sclerosis is present, with no evidence of aortic valve stenosis. Aortic valve mean gradient measures 6.0 mmHg. Aortic valve peak gradient measures 12.2 mmHg. Aortic valve area, by VTI measures 2.42 cm?. Pulmonic Valve: The pulmonic valve was normal in structure. Pulmonic valve regurgitation is not visualized. No evidence of pulmonic stenosis. Aorta: The aortic root is normal in size and structure. Venous: The inferior vena cava is normal in size with greater than 50% respiratory variability, suggesting right atrial pressure of 3 mmHg. IAS/Shunts: No atrial level shunt detected by color flow Doppler.  LEFT VENTRICLE PLAX 2D LVIDd:         4.30 cm     Diastology LVIDs:         2.70 cm     LV e' medial:    6.42 cm/s LV PW:         1.10 cm     LV E/e' medial:  11.2 LV IVS:        1.10 cm     LV e' lateral:   9.14 cm/s LVOT diam:     2.00 cm     LV E/e' lateral: 7.9 LV SV:         79 LV SV Index:   41 LVOT Area:     3.14 cm?  LV Volumes (MOD) LV vol d, MOD A2C: 59.5 ml LV vol d, MOD A4C: 75.8 ml LV vol s, MOD A2C: 19.5 ml LV vol s, MOD A4C: 25.2 ml LV SV MOD A2C:     40.0 ml LV SV MOD A4C:     75.8 ml LV SV MOD BP:      45.5 ml RIGHT VENTRICLE RV Basal diam:  3.30 cm RV Mid diam:    2.90 cm RV S prime:     14.70 cm/s TAPSE (M-mode): 2.6 cm LEFT ATRIUM             Index        RIGHT ATRIUM           Index LA diam:        3.90 cm 2.05 cm/m?   RA Area:     14.00 cm? LA Vol (A2C):   27.8 ml 14.64 ml/m?  RA Volume:   38.30 ml  20.17 ml/m? LA Vol (A4C):   38.8 ml 20.43 ml/m? LA Biplane Vol: 33.2 ml 17.48 ml/m?  AORTIC VALVE                     PULMONIC VALVE AV Area (Vmax):    2.28 cm?      PV Vmax:       0.90 m/s AV Area (Vmean):   2.10 cm?      PV Peak grad:  3.2 mmHg  AV Area (VTI):     2.42 cm? AV Vmax:           175.00 cm/s AV Vmean:          113.000 cm/s AV VTI:            0.325 m AV Peak Grad:      12.2 mmHg AV Mean Grad:      6.0  mmHg LVOT Vmax:         127.00 cm/s LVOT Vmean:        75.700 cm/s LVOT VTI:          0.250 m LVOT/AV VTI ratio: 0.77  AORTA Ao Root diam: 3.10 cm Ao Asc diam:  3.40 cm MITRAL VALVE MV Area (PHT): 3.24 cm?    SHUNTS MV Area VTI:   2.37 cm?    Systemic VTI:  0.25 m MV Peak grad:  4.6 mmHg    Systemic Diam: 2.00 cm MV Mean grad:  2.0 mmHg MV Vmax:       1.07 m/s MV Vmean:      65.1 cm/s MV Decel Time: 234 msec MV E velocity: 72.00 cm/s MV A velocity: 89.50 cm/s MV E/A ratio:  0.80 Arayah Krouse Rouge MD Electronically signed by Sheriden Archibeque Rouge MD Signature Date/Time: 08/06/2021/4:29:06 PM    Final    ? ?Anti-infectives: ?Anti-infectives (From admission, onward)  ? ? Start     Dose/Rate Route Frequency Ordered Stop  ? 08/02/21 1126  sodium chloride 0.9 % with cefoTEtan (CEFOTAN) ADS Med       ?Note to Pharmacy: Jerrye Beavers: cabinet override  ?    08/02/21 1126 08/02/21 1636  ? 08/01/21 1400  neomycin (MYCIFRADIN) tablet 1,000 mg       ?See Hyperspace for full Linked Orders Report.  ? 1,000 mg Oral 3 times per day 08/01/21 0937 08/01/21 2121  ? 08/01/21 1400  metroNIDAZOLE (FLAGYL) tablet 1,000 mg       ?See Hyperspace for full Linked Orders Report.  ? 1,000 mg Oral 3 times per day 08/01/21 0937 08/01/21 2121  ? 08/01/21 1030  cefoTEtan (CEFOTAN) 2 g in sodium chloride 0.9 % 100 mL IVPB  Status:  Discontinued       ? 2 g ?200 mL/hr over 30 Minutes Intravenous On call to O.R. 08/01/21 1937 08/02/21 0559  ? ?  ? ? ?Assessment/Plan: ?s/p Procedure(s): ?En bloc Resection of Colon and Small bowel ?ILEOSTOMY ?Impression: Patient has been switched to oral medications to control his atrial fibrillation.  Okay for discharge from surgery standpoint.  Patient will follow-up with Dr. Constance Haw on 08/16/2021.  Continue Imodium as an outpatient. ? LOS: 8 days  ? ? ?Aviva Signs ?08/08/2021  ?

## 2021-08-08 NOTE — Progress Notes (Signed)
08/08/2021 ?1:45 PM ? ?Came to re-assess patient. He complaining of excessive belching, abdominal pain and esophageal spasms.  Increased dose of protonix, added simethicone. Hold DC today.  Recheck in AM.  Restart IV.  Can DC tele monitoring.  ? ?C. Wynetta Emery, MD  ?

## 2021-08-08 NOTE — Discharge Summary (Addendum)
Physician Discharge Summary  ?Todd Richardson SHF:[id number] DOB: 01-06-1956 DOA: 07/31/2021 ? ?PCP: [name], MD ?Surgery: [facility] ? ?Admit date: 07/31/2021 ?Discharge date: 08/09/2021 ? ?Admitted From:  Home  ?Disposition: Home with Home Health  ? ?Recommendations for Outpatient Follow-up:  ?Follow up with PCP in 1 weeks ?Follow up with surgery Dr. Constance Haw on 08/16/21 as already scheduled  ?Ambulatory referral made to cardiology for new onset atrial fibrillation ?Ambulatory referral made to urology for acute urinary retention, needs voiding trial  ?Ambulatory referral made to oncology.  ?Bleeding precautions while on anticoagulation therapy ?Please check CBC in 1-2 weeks to follow up hemoglobin  ? ?Home Health:  RN  ? ?Discharge Condition: STABLE   ?CODE STATUS: FULL  ?DIET:  soft foods diet   ? ?Brief Hospitalization Summary: ?Please see all hospital notes, images, labs for full details of the hospitalization. ?ADMISSION HPI: Todd Richardson is a 66 y.o. male with medical history significant of hypertension, asthma and seasonal allergies; who presented to the hospital from from home with intention to have screening colonoscopy and further evaluation.  Patient has been experiencing weight loss, abdominal pain, diarrhea and decreased appetite prior to admission.  During colonoscopy evaluation and almost completely obstructive mass was found in the sigmoid colon with high concerns for malignancy.  [facility] hospitalist has been contacted to admit patient to further facilitate his care, staging and surgical intervention.   Patient reports no fever, no nausea, no vomiting, no chest pain, no shortness of breath, no focal weaknesses, no dysuria.  He has been having abdominal pain mid abdomen and lower quadrants intermittently, crampy and present for weeks prior to admission.  Associated decreased appetite and weight loss has been also reported.  ?  ?Patient is not vaccinated against COVID. ?   ?Hemodynamically stable at time of evaluation nonfebrile.  Case discussed with gastroenterologist and also with general surgery. ? ?HOSPITAL COURSE BY PROBLEM LIST  ? ?-Assessment and Plan: ? 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 The mass was circumferential. The mass measured five cm in length. Oozing was present.  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   ?-Ostomy wound RN consult ?-output from ostomy bag better controlled now on Imodium and Metamucil.  Per surgery ok to discharge home with appt for follow up with Dr. Constance Haw on 08/16/21. ?-High risk for dehydration ?-Urinary retention requiring in and out now #indwelling Foley.  He will discharge with foley and have outpatient urology follow up for voiding trial.  ?  ?New onset A-fib with RVR ?Echo with EF of 60 to 65% with grade 1 diastolic dysfunction mildly dilated left atrium, no significant aortic or mitral stenosis ?-Magnesium WNL potassium WNL ?-Hypophosphatemia noted--replaced ?-Weaned  off IV Cardizem, transition to oral Cardizem on  08/07/2021 ?-Apixaban 5 mg BID for full anticoagulation.  Monitor for bleeding complications on apixaban.   ?- Outpatient follow up with cardiology recommended.  Ambulatory referral made to [facility].  ?  ?Thrombocytosis ?--Could be reactive--- resolved ?  ?Hyponatremia - Resolved now ?--Suspect dehydration related versus SIADH of malignancy ?  ?Leukocytosis - Reactive  ?-Most likely a stress demargination and dehydration/reactive ?-Currently no frank/specific source of infection identified ?-WBC has normalized ?  ?Hyperglycemia ?-No prior history of diabetes ?-A1C 5.8 ?  ?HTN (hypertension) ?- Blood pressure overall stable ?-In the setting of hypokalemia and dehydration ?-- Continue to hold lisinopril/HCTZ due to being started on cardizem CD 120 mg daily for Atrial Fib.  ? ?Social/Ethics--- plan of care and advanced directives discussed with patient, his brother [name]/[name] present at bedside ?-Patient is a full code ?  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   Recommend rechecking CBC in 1-2 weeks on outpatient follow up.  ?  ?Urinary Retention--Foley removed 08/03/2021 ?-Voiding difficulties noted-- required in and out catheterization ?-Continues to have poor urine output in the setting of very very high output from ostomy bag ?-Urinary retention requiring in and out now #indwelling Foley ?indwelling Foley catheter placed on 08/07/2021 due to recurrent urinary retention requiring in and out catheterization despite Flomax use ?- Pt will discharge with foley on Flomax and follow-up with urology as outpatient for voiding trial ?Ambulatory referral made to urology office [place] for voiding trial  ?  ?Discharge Diagnoses:  ?Principal Problem: ?  Large bowel obstruction ([place]) ?Active Problems: ?  Colon cancer ([place]) ?  HTN (hypertension) ?   Hypokalemia ?  Hyperglycemia ?  Leukocytosis ?  Hyponatremia ?  Thrombocytosis ?  Nausea without vomiting ?  Atrial fibrillation with RVR ([place]) ? ? ?Discharge Instructions: ?Discharge Instructions   ? ? Ambulatory referral to Cardiology   Complete by: As directed ?  ? Ambulatory referral to Hematology / Oncology   Complete by: As directed ?  ? Ambulatory referral to Urology   Complete by: As directed ?  ? ?  ? ?Allergies as of 08/09/2021   ? ?   Reactions  ? Apple Juice Anaphylaxis  ? Throat swelling  ? ?  ? ?  ?Medication List  ?  ? ?STOP taking these medications   ? ?amLODipine 5 MG tablet ?Commonly known as: NORVASC ?  ?aspirin 325 MG tablet ?  ?lisinopril-hydrochlorothiazide 10-12.5 MG tablet ?Commonly known as: ZESTORETIC ?  ?potassium chloride 20 MEQ/15ML (10%) Soln ?  ? ?  ? ?TAKE these medications   ? ?apixaban 5 MG Tabs tablet ?Commonly known as: ELIQUIS ?Take 1 tablet (5 mg total) by mouth 2 (two) times daily. ?  ?calcium carbonate 500 MG chewable tablet ?Commonly known as: TUMS - dosed in mg elemental calcium ?Chew 1 tablet (200 mg of elemental calcium total) by mouth 3 (three) times daily with meals. ?  ?cetirizine 10 MG tablet ?Commonly known as: ZYRTEC ?Take 10 mg by mouth as needed for allergies. ?  ?diltiazem 120 MG 24 hr capsule ?Commonly known as: CARDIZEM CD ?Take 1 capsule (120 mg total) by mouth daily. ?  ?loperamide 2 MG capsule ?Commonly known as: IMODIUM ?Take 2 capsules (4 mg total) by mouth 4 (four) times daily -  with meals and at bedtime. ?  ?  pantoprazole 40 MG tablet ?Commonly known as: PROTONIX ?Take 1 tablet (40 mg total) by mouth daily. ?  ?phosphorus 155-852-130 MG tablet ?Commonly known as: K PHOS NEUTRAL ?Take 1 tablet (250 mg total) by mouth 3 (three) times daily for 6 days. ?  ?psyllium 95 % Pack ?Commonly known as: HYDROCIL/METAMUCIL ?Take 1 packet by mouth daily. ?  ?tamsulosin 0.4 MG Caps capsule ?Commonly known as: FLOMAX ?Take 1 capsule (0.4 mg total) by mouth 2 (two) times  daily. ?  ? ?  ? ? Follow-up Information   ? ? Virl Cagey, MD Follow up on 08/16/2021.   ?Specialty: General Surgery ?Why: staple removal ?Contact information: ?[address] [place] [address] ?[phone]

## 2021-08-08 NOTE — Progress Notes (Signed)
Ostomy referral ?

## 2021-08-08 NOTE — TOC Progression Note (Signed)
Transition of Care (TOC) - Progression Note  ? ? ?Patient Details  ?Name: Todd Richardson ?MRN: [id number] ?Date of Birth: July 10, 1955 ? ?Transition of Care (TOC) CM/SW Contact  ?Shade Flood, LCSW ?Phone Number: ?08/08/2021, 1:38 PM ? ?Clinical Narrative:    ? ?TOC following. MD was initially hopeful for dc home today though pt now not stable to dc today. TOC met with pt and his nephew at bedside to review dc planning. Plan is for pt to go home with his nephew, [name] Kitchen, when stable. Per [name] Kitchen, he and his wife completed one ostomy teaching session and are scheduled for another one tomorrow at [facility] states that the Ostomy RN used his address for the supply order with [facility].  ? ?Discussed [facility] provider options and will refer as requested. Awaiting response from [facility] as to whether they can accept pt at dc. ? ?Will continue to follow and assist with dc planning. ? ?Expected Discharge Plan: [place] ?Barriers to Discharge: Continued Medical Work up ? ?Expected Discharge Plan and Services ?Expected Discharge Plan: [place] ?In-house Referral: Clinical Social Work ?Discharge Planning Services: CM Consult ?  ?Living arrangements for the past 2 months: [place] ?Expected Discharge Date: 08/08/21               ?  ?  ?  ?  ?  ?  ?  ?  ?  ?  ? ? ?Social Determinants of Health (SDOH) Interventions ?  ? ?Readmission Risk Interventions ? ?  08/01/2021  ? 12:16 PM  ?Readmission Risk Prevention Plan  ?Transportation Screening Complete  ?Home Care Screening Complete  ?Medication Review (RN CM) Complete  ? ? ?

## 2021-08-08 NOTE — Progress Notes (Signed)
Nsg Discharge Note ? ?Admit Date:  07/31/2021 ?Discharge date: 08/08/2021 ?  ?Todd Richardson to be D/C'd Home per MD order.  AVS completed.  ?Patient/caregiver able to verbalize understanding. ? ?Discharge Medication: ?Allergies as of 08/08/2021   ? ?   Reactions  ? Apple Juice Anaphylaxis  ? Throat swelling  ? ?  ? ?  ?Medication List  ?  ? ?STOP taking these medications   ? ?amLODipine 5 MG tablet ?Commonly known as: NORVASC ?  ?aspirin 325 MG tablet ?  ?lisinopril-hydrochlorothiazide 10-12.5 MG tablet ?Commonly known as: ZESTORETIC ?  ?potassium chloride 20 MEQ/15ML (10%) Soln ?  ? ?  ? ?TAKE these medications   ? ?apixaban 5 MG Tabs tablet ?Commonly known as: ELIQUIS ?Take 1 tablet (5 mg total) by mouth 2 (two) times daily. ?Start taking on: August 09, 2021 ?  ?calcium carbonate 500 MG chewable tablet ?Commonly known as: TUMS - dosed in mg elemental calcium ?Chew 1 tablet (200 mg of elemental calcium total) by mouth 3 (three) times daily with meals. ?  ?cetirizine 10 MG tablet ?Commonly known as: ZYRTEC ?Take 10 mg by mouth as needed for allergies. ?  ?diltiazem 120 MG 24 hr capsule ?Commonly known as: CARDIZEM CD ?Take 1 capsule (120 mg total) by mouth daily. ?Start taking on: August 09, 2021 ?  ?loperamide 2 MG capsule ?Commonly known as: IMODIUM ?Take 2 capsules (4 mg total) by mouth 4 (four) times daily -  with meals and at bedtime. ?  ?pantoprazole 40 MG tablet ?Commonly known as: PROTONIX ?Take 1 tablet (40 mg total) by mouth daily. ?Start taking on: August 09, 2021 ?  ?phosphorus 155-852-130 MG tablet ?Commonly known as: K PHOS NEUTRAL ?Take 1 tablet (250 mg total) by mouth 3 (three) times daily for 6 days. ?  ?psyllium 95 % Pack ?Commonly known as: HYDROCIL/METAMUCIL ?Take 1 packet by mouth daily. ?Start taking on: August 09, 2021 ?  ?tamsulosin 0.4 MG Caps capsule ?Commonly known as: FLOMAX ?Take 1 capsule (0.4 mg total) by mouth 2 (two) times daily. ?  ? ?  ? ? ?Discharge Assessment: ?Vitals:  ? 08/07/21 2127  08/08/21 0526  ?BP: 94/71 98/70  ?Pulse: 91 83  ?Resp: 19 19  ?Temp: 98 ?F (36.7 ?C) 98 ?F (36.7 ?C)  ?SpO2: 99% (!) 86%  ? Skin clean, dry and intact without evidence of skin break down, no evidence of skin tears noted. ?IV catheter discontinued intact. Site without signs and symptoms of complications - no redness or edema noted at insertion site, patient denies c/o pain - only slight tenderness at site.  Dressing with slight pressure applied. ? ?D/c Instructions-Education: ?Discharge instructions given to patient/family with verbalized understanding. ?D/c education completed with patient/family including follow up instructions, medication list, d/c activities limitations if indicated, with other d/c instructions as indicated by MD - patient able to verbalize understanding, all questions fully answered. ?Patient instructed to return to ED, call 911, or call MD for any changes in condition.  ?Patient escorted via [place], and D/C home via private auto. ? ?Kathie Rhodes, RN ?08/08/2021 12:12 PM  ?

## 2021-08-09 ENCOUNTER — Encounter: Payer: Medicare Other | Admitting: Surgery

## 2021-08-09 LAB — BASIC METABOLIC PANEL
Anion gap: 3 — ABNORMAL LOW (ref 5–15)
BUN: <5 mg/dL — ABNORMAL LOW (ref 8–23)
CO2: 30 mmol/L (ref 22–32)
Calcium: 7.8 mg/dL — ABNORMAL LOW (ref 8.9–10.3)
Chloride: 104 mmol/L (ref 98–111)
Creatinine, Ser: 0.81 mg/dL (ref 0.61–1.24)
GFR, Estimated: >60 mL/min (ref >60)
Glucose, Bld: 131 mg/dL — ABNORMAL HIGH (ref 70–99)
Potassium: 3.9 mmol/L (ref 3.5–5.1)
Sodium: 137 mmol/L (ref 135–145)

## 2021-08-09 NOTE — Consult Note (Signed)
[place] Nurse ostomy follow up ?Stoma type/location: RLQ, end ileostomy  ?Stomal assessment/size: 1 1/2" round, budded, os at center  ?Peristomal assessment: intact  ?Treatment options for stomal/peristomal skin: 2" skin barrier ?Output liquid green but thicker ?Ostomy pouching: 2pc. 2 1/4" with 2" ostomy barrier ring  ?Education provided:  ?Met with nephew [name] Kitchen at bedside.  He was eager to perform all aspects of care. He emptied pouch, cut new skin barrier, applied barrier ring, applied skin barrier, and attached new pouch to skin barrier.  Patient did open and close lock and roll closure, I have suggested they work on patient's independence with opening at home. ?8 skin barriers, pouches, and barrier rings home with patient as well as template and educational materials.  Provided [employer] catalog and marked items needed. Patient will not have [place] until next week.  Communicated with SW and hospitalist throughout the day on this. Prepared for DC, I have notified SW of same  ?Enrolled patient in [place] Start Discharge program: Yes, verified nephews address was used for enrollment ? ?Lorry Anastasi [facility], CNS, CWON-AP ?[phone]  ?

## 2021-08-09 NOTE — TOC Transition Note (Signed)
Transition of Care (TOC) - CM/SW Discharge Note ? ? ?Patient Details  ?Name: Todd Richardson ?MRN: [id number] ?Date of Birth: 1955/11/16 ? ?Transition of Care (TOC) CM/SW Contact:  ?Shade Flood, LCSW ?Phone Number: ?08/09/2021, 12:11 PM ? ? ?Clinical Narrative:    ? ?Pt stable for dc home today per MD. Plan remains for pt to dc home with his nephew who lives in [place]. Have spoken with nephew today to review dc planning. Nephew aware that pt will have [place] services from [facility] and that they will contact him to arrange first visit which will be Tuesday or Wednesday next week. Pt's nephew expresses that he feels that they will be able to manage pt's care while waiting for follow up from [facility]. Options for colostomy supply providers given to pt's nephew as well and he stated that they will follow up to get things ordered. W&O RN states pt will be discharged with 2 weeks worth of supplies to get them started. ? ?There are no other TOC needs for dc. ? ?Final next level of care: [place] ?Barriers to Discharge: Barriers Resolved ? ? ?Patient Goals and CMS Choice ?Patient states their goals for this hospitalization and ongoing recovery are:: Return home ?CMS Medicare.gov Compare Post Acute Care list provided to:: Patient ?Choice offered to / list presented to : Patient ? ?Discharge Placement ?  ?           ?  ?  ?  ?  ? ?Discharge Plan and Services ?In-house Referral: Clinical Social Work ?Discharge Planning Services: CM Consult ?           ?  ?  ?  ?  ?  ?HH Arranged: RN, PT, OT ?[place] Agency: [place] ?Date HH Agency Contacted: 08/08/21 ?  ?Representative spoke with at [place]: Santiago Glad ? ?Social Determinants of Health (SDOH) Interventions ?  ? ? ?Readmission Risk Interventions ? ?  08/01/2021  ? 12:16 PM  ?Readmission Risk Prevention Plan  ?Transportation Screening Complete  ?Home Care Screening Complete  ?Medication Review (RN CM) Complete  ? ? ? ? ? ?

## 2021-08-09 NOTE — Evaluation (Signed)
Physical Therapy Evaluation ?Patient Details ?Name: Todd Richardson ?MRN: [id number] ?DOB: [birth year] ?Today's Date: 08/09/2021 ? ?History of Present Illness ? MACKLEN WILHOITE is a 66 y.o. male with medical history significant of hypertension, asthma and seasonal allergies; who presented to the hospital from from home with intention to have screening colonoscopy and further evaluation.  Patient has been experiencing weight loss, abdominal pain, diarrhea and decreased appetite prior to admission.  During colonoscopy evaluation and almost completely obstructive mass was found in the sigmoid colon with high concerns for malignancy.  [facility] hospitalist has been contacted to admit patient to further facilitate his care, staging and surgical intervention.   Patient reports no fever, no nausea, no vomiting, no chest pain, no shortness of breath, no focal weaknesses, no dysuria.   He has been having abdominal pain mid abdomen and lower quadrants intermittently, crampy and present for weeks prior to admission.  Associated decreased appetite and weight loss has been also reported. ?  ?Clinical Impression ? Patient has to lean on nearby objects for support when using SPC, required use of RW for safety demonstrating good return for ambulating in room and hallway without loss of balance.  Patient has to take frequent standing rest breaks during ambulation and tolerated staying up in chair after therapy.  PLAN:  Patient to be discharged home today and discharged from acute physical therapy to care of nursing for ambulation as tolerated for length of stay with recommendations stated below ?   ?   ? ?Recommendations for follow up therapy are one component of a multi-disciplinary discharge planning process, led by the attending physician.  Recommendations may be updated based on patient status, additional functional criteria and insurance authorization. ? ?Follow Up Recommendations Home health PT ? ?  ?Assistance Recommended at Discharge  Set up Supervision/Assistance  ?Patient can return home with the following ? A little help with walking and/or transfers;A little help with bathing/dressing/bathroom;Help with stairs or ramp for entrance;Assistance with cooking/housework ? ?  ?Equipment Recommendations Rolling walker (2 wheels)  ?Recommendations for Other Services ?    ?  ?Functional Status Assessment Patient has had a recent decline in their functional status and demonstrates the ability to make significant improvements in function in a reasonable and predictable amount of time.  ? ?  ?Precautions / Restrictions Precautions ?Precautions: Fall ?Restrictions ?Weight Bearing Restrictions: No  ? ?  ? ?Mobility ? Bed Mobility ?Overal bed mobility: Modified Independent ?  ?  ?  ?  ?  ?  ?  ?  ? ?Transfers ?Overall transfer level: Modified independent ?  ?  ?  ?  ?  ?  ?  ?  ?General transfer comment: has to lean on siderail of bed when using SPC, safer using RW ?  ? ?Ambulation/Gait ?Ambulation/Gait assistance: Supervision ?Gait Distance (Feet): 65 Feet ?Assistive device: Rolling walker (2 wheels) ?Gait Pattern/deviations: Decreased step length - right, Decreased step length - left, Decreased stride length ?Gait velocity: decreased ?  ?  ?General Gait Details: slow slightly labored cadence requiring frequent standing rest breaks leaning on RW, no loss of balance, limited mostly due to fatigue ? ?Stairs ?  ?  ?  ?  ?  ? ?Wheelchair Mobility ?  ? ?Modified Rankin (Stroke Patients Only) ?  ? ?  ? ?Balance Overall balance assessment: Needs assistance ?Sitting-balance support: Feet supported, No upper extremity supported ?Sitting balance-Leahy Scale: Good ?Sitting balance - Comments: seated at EOB ?  ?Standing balance support: During functional activity,  Single extremity supported ?Standing balance-Leahy Scale: Poor ?Standing balance comment: fair/poor using SPC, fair/good using RW ?  ?  ?  ?  ?  ?  ?  ?  ?  ?  ?  ?   ? ? ? ?Pertinent Vitals/Pain Pain  Assessment ?Pain Assessment: Faces ?Faces Pain Scale: Hurts a little bit ?Pain Location: stomach at surgical site ?Pain Descriptors / Indicators: Sore ?Pain Intervention(s): Limited activity within patient's tolerance, Monitored during session  ? ? ?Home Living Family/patient expects to be discharged to:: Private residence ?Living Arrangements: Alone ?Available Help at Discharge: Family;Available PRN/intermittently ?Type of Home: House ?Home Access: Stairs to enter ?Entrance Stairs-Rails: Right ?Entrance Stairs-Number of Steps: 3 ?  ?Home Layout: One level ?Home Equipment: Kasandra Knudsen - single point ?   ?  ?Prior Function Prior Level of Function : Independent/Modified Independent ?  ?  ?  ?  ?  ?  ?Mobility Comments: community ambulator using SPC, drives ?ADLs Comments: Independent ?  ? ? ?Hand Dominance  ? Dominant Hand: Right ? ?  ?Extremity/Trunk Assessment  ? Upper Extremity Assessment ?Upper Extremity Assessment: Overall WFL for tasks assessed ?  ? ?Lower Extremity Assessment ?Lower Extremity Assessment: Generalized weakness ?  ? ?Cervical / Trunk Assessment ?Cervical / Trunk Assessment: Kyphotic  ?Communication  ? Communication: No difficulties  ?Cognition Arousal/Alertness: Awake/alert ?Behavior During Therapy: [facility] for tasks assessed/performed ?Overall Cognitive Status: Within Functional Limits for tasks assessed ?  ?  ?  ?  ?  ?  ?  ?  ?  ?  ?  ?  ?  ?  ?  ?  ?  ?  ?  ? ?  ?General Comments   ? ?  ?Exercises    ? ?Assessment/Plan  ?  ?PT Assessment All further PT needs can be met in the next venue of care  ?PT Problem List Decreased strength;Decreased activity tolerance;Decreased balance;Decreased mobility ? ?   ?  ?PT Treatment Interventions     ? ?PT Goals (Current goals can be found in the Care Plan section)  ?Acute Rehab PT Goals ?Patient Stated Goal: return home with family to assist ?PT Goal Formulation: With patient ?Time For Goal Achievement: 08/09/21 ?Potential to Achieve Goals: Good ? ?  ?Frequency   ?   ? ? ?Co-evaluation   ?  ?  ?  ?  ? ? ?  ?AM-PAC PT "6 Clicks" Mobility  ?Outcome Measure Help needed turning from your back to your side while in a flat bed without using bedrails?: None ?Help needed moving from lying on your back to sitting on the side of a flat bed without using bedrails?: None ?Help needed moving to and from a bed to a chair (including a wheelchair)?: None ?Help needed standing up from a chair using your arms (e.g., wheelchair or bedside chair)?: None ?Help needed to walk in hospital room?: A Little ?Help needed climbing 3-5 steps with a railing? : A Lot ?6 Click Score: 21 ? ?  ?End of Session   ?Activity Tolerance: Patient tolerated treatment well;Patient limited by fatigue ?Patient left: in chair;with call bell/phone within reach ?Nurse Communication: Mobility status ?PT Visit Diagnosis: Unsteadiness on feet (R26.81);Other abnormalities of gait and mobility (R26.89);Muscle weakness (generalized) (M62.81) ?  ? ?Time: 9381-8299 ?PT Time Calculation (min) (ACUTE ONLY): 29 min ? ? ?Charges:   PT Evaluation ?$PT Eval Moderate Complexity: 1 Mod ?PT Treatments ?$Therapeutic Activity: 23-37 mins ?  ?   ? ? ?12:36 PM, 08/09/21 ?Lonell Grandchild,  MPT ?Physical Therapist with [place] ?[facility] ?[phone] office ?[phone] mobile phone ? ? ?

## 2021-08-09 NOTE — Progress Notes (Signed)
08/09/2021 ?9:40 AM ? ?Pt says he feels fine today and wants to discharge home.  No abdominal complaints today.  Tolerating diet well.  He is working with ostomy nurse today for further education.  Proceed with discharge today.  Please see dictated discharge summary.   ? ?C. Wynetta Emery, MD  ? ?

## 2021-08-13 ENCOUNTER — Ambulatory Visit (HOSPITAL_COMMUNITY): Payer: Medicare Other | Admitting: Hematology

## 2021-08-13 ENCOUNTER — Encounter: Payer: Self-pay | Admitting: *Deleted

## 2021-08-14 ENCOUNTER — Telehealth: Payer: Self-pay | Admitting: *Deleted

## 2021-08-14 NOTE — Telephone Encounter (Signed)
On August 13, 2021, [facility] received call from patient nephew, [name] Kitchen [phone]~ telephone. Reported that patient was admitted to [facility] and would not be able to make appointment with Dr. Constance Haw on 08/16/2021. Appointment was cancelled per Benjamin's request. [name] Kitchen also stated that patient care would be transferred to local physicians in [facility], as this is where [place] and patient would be residing.  ? ?On August 14, 2021, [facility] received call from patient nephew, [name] Kitchen. Reported that patient unable to be seen by general surgery providers in [place], [place]. Reports that he was advised to follow up with [facility]. Appointment reinstated for 08/16/2021 with Dr. Constance Haw.  ? ?On August 14, 2021, [facility] received call from patient brother, [name] [phone]~ telephone. Reported that patient is residing with [place] son, [name] Kitchen. However, stated that [name] Kitchen is not communicating with [name] at this time. [name] requested to have Dr. Constance Haw contact him directly to discuss patient's care.  ? ?Per appointment desk, [name] Kitchen has POA. [name] Kitchen requested that no information be given to patient brother [name]. Advised that POA forms have not been scanned into chart and requested [name] Kitchen bring forms to upcoming appointment.  ? ?Dr. Constance Haw made aware that patient had been hospitalized. [name] Kitchen reported that patient was noted to be weak and had no urinary output. EMS was contacted and patient transported to nearest hospital.  ? ?Medical records from recent hospitalization requested:  ?[address] [place], [place] [address] ?Health Information Management Department ?[phone]~ telephone/ [phone]~ fax ? ?Received instructions from Dr. Constance Haw to verify that patient is following Ileostomy Output guide. Also instructed writer to verify that patient is taking Imodium. Call placed to [facility] and was advised that patient has been following outline and is using Imodium as directed.  ?

## 2021-08-15 NOTE — Progress Notes (Deleted)
Cardiology Office Note   Date:  08/15/2021   ID:  Todd Richardson, DOB Jan 27, 1956, MRN [id number]  PCP:  [name], MD  Cardiologist:   Salvator Seppala Martinique, MD   No chief complaint on file.     History of Present Illness: Todd Richardson is a 66 y.o. male who is seen at the request  of Dr Wynetta Emery for evaluation of atrial fibrillation. He was admitted in April 2023 with bowel obstruction due to a large colonic mass. GI and general surgery consulted. He had  S/p  subtotal colectomy and portion of terminal ileum out, and  ileostomy on 08/02/21. Pathology from colonoscopy on 07/31/2021 consistent with--Invasive moderately differentiated adenocarcinoma , Tumor arises within a tubular adenoma with high-grade dysplasia. During this hospitalization he was found to be in Afib. He was treated with Diltiazem and started on Eliquis post op. Echo showed normal EF with LAE. No significant valvular abnormality. He was in NSR in 2019. He did have some post op urinary retention, hyponatremia and anemia.     Past Medical History:  Diagnosis Date   Arthritis    Asthma    Vertigo     Past Surgical History:  Procedure Laterality Date   BIOPSY  07/31/2021   Procedure: BIOPSY;  Surgeon: Eloise Harman, DO;  [facility];  Service: Endoscopy;;   CATARACT EXTRACTION Left    COLON RESECTION N/A 08/02/2021   Procedure: En bloc Resection of Colon and Small bowel;  Surgeon: Virl Cagey, MD;  Location: [facility] ORS;  Service: General;  Laterality: N/A;   COLOSTOMY Right 08/02/2021   Procedure: ILEOSTOMY;  Surgeon: Virl Cagey, MD;  Location: [facility] ORS;  Service: General;  Laterality: Right;   FLEXIBLE SIGMOIDOSCOPY  07/31/2021   Procedure: FLEXIBLE SIGMOIDOSCOPY;  Surgeon: Eloise Harman, DO;  Location: [facility];  Service: Endoscopy;;   SUBMUCOSAL TATTOO INJECTION  07/31/2021   Procedure: SUBMUCOSAL TATTOO INJECTION;  Surgeon: Eloise Harman, DO;  Location: [facility];  Service:  Endoscopy;;   SUBTOTAL COLECTOMY     end ileostomy; sigmoid and cecal cancers     Current Outpatient Medications  Medication Sig Dispense Refill   apixaban (ELIQUIS) 5 MG TABS tablet Take 1 tablet (5 mg total) by mouth 2 (two) times daily. 60 tablet 1   calcium carbonate (TUMS - DOSED IN MG ELEMENTAL CALCIUM) 500 MG chewable tablet Chew 1 tablet (200 mg of elemental calcium total) by mouth 3 (three) times daily with meals. 90 tablet 0   cetirizine (ZYRTEC) 10 MG tablet Take 10 mg by mouth as needed for allergies.     diltiazem (CARDIZEM CD) 120 MG 24 hr capsule Take 1 capsule (120 mg total) by mouth daily. 30 capsule 1   loperamide (IMODIUM) 2 MG capsule Take 2 capsules (4 mg total) by mouth 4 (four) times daily -  with meals and at bedtime. 240 capsule 1   pantoprazole (PROTONIX) 40 MG tablet Take 1 tablet (40 mg total) by mouth daily. 30 tablet 1   psyllium (HYDROCIL/METAMUCIL) 95 % PACK Take 1 packet by mouth daily. 240 each 2   tamsulosin (FLOMAX) 0.4 MG CAPS capsule Take 1 capsule (0.4 mg total) by mouth 2 (two) times daily. 60 capsule 1   No current facility-administered medications for this visit.    Allergies:   Apple juice    Social History:  The patient  reports that he has never smoked. He has never used smokeless tobacco. He  reports current alcohol use of about 4.0 standard drinks per week. He reports that he does not use drugs.   Family History:  The patient's ***family history includes Dementia in his father; Diabetes in his mother; Hypertension in his mother; Parkinson's disease in his father.    ROS:  Please see the history of present illness.   Otherwise, review of systems are positive for {NONE DEFAULTED:18576}.   All other systems are reviewed and negative.    PHYSICAL EXAM: VS:  There were no vitals taken for this visit. , BMI There is no height or weight on file to calculate BMI. GEN: Well nourished, well developed, in no acute distress HEENT: normal Neck: no  JVD, carotid bruits, or masses Cardiac: ***RRR; no murmurs, rubs, or gallops,no edema  Respiratory:  clear to auscultation bilaterally, normal work of breathing GI: soft, nontender, nondistended, + BS MS: no deformity or atrophy Skin: warm and dry, no rash Neuro:  Strength and sensation are intact Psych: euthymic mood, full affect   EKG:  EKG {ACTION; IS/IS RJJ:[id number]} ordered today. The ekg ordered today demonstrates ***   Recent Labs: 07/31/2021: ALT 15 08/05/2021: Hemoglobin 8.3; Platelets 386; TSH 2.832 08/06/2021: Magnesium 2.2 08/09/2021: BUN <5; Creatinine, Ser 0.81; Potassium 3.9; Sodium 137    Lipid Panel No results found for: CHOL, TRIG, HDL, CHOLHDL, VLDL, LDLCALC, LDLDIRECT    Wt Readings from Last 3 Encounters:  08/07/21 171 lb 11.8 oz (77.9 kg)  07/04/21 188 lb (85.3 kg)  07/27/18 195 lb (88.5 kg)      Other studies Reviewed: Additional studies/ records that were reviewed today include: ***. Review of the above records demonstrates: ***   ASSESSMENT AND PLAN:  1.  ***   Current medicines are reviewed at length with the patient today.  The patient {ACTIONS; HAS/DOES NOT HAVE:19233} concerns regarding medicines.  The following changes have been made:  {PLAN; NO CHANGE:13088:s}  Labs/ tests ordered today include: *** No orders of the defined types were placed in this encounter.        Disposition:   FU with *** in {gen number 3-01:601093} {Days to years:10300}  Signed, Byford Schools Martinique, MD  08/15/2021 4:18 PM    [facility] [address], [place], [place], [address] Phone [phone], Fax [phone]

## 2021-08-16 ENCOUNTER — Encounter: Payer: Medicare Other | Admitting: General Surgery

## 2021-08-16 ENCOUNTER — Telehealth: Payer: Self-pay | Admitting: *Deleted

## 2021-08-16 DIAGNOSIS — I350 Nonrheumatic aortic (valve) stenosis: Secondary | ICD-10-CM

## 2021-08-16 NOTE — Telephone Encounter (Signed)
Received call from patient nephew, [name] Kitchen [phone]~ telephone. Reported that patient was admitted to [facility] and would not be able to make appointment with Dr. Constance Haw on 08/16/2021. ? ? ?

## 2021-08-17 ENCOUNTER — Ambulatory Visit: Payer: Medicare Other | Admitting: Cardiology

## 2021-08-20 ENCOUNTER — Encounter: Payer: Self-pay | Admitting: *Deleted

## 2021-08-20 ENCOUNTER — Telehealth (INDEPENDENT_AMBULATORY_CARE_PROVIDER_SITE_OTHER): Payer: Medicare Other | Admitting: General Surgery

## 2021-08-20 DIAGNOSIS — K56609 Unspecified intestinal obstruction, unspecified as to partial versus complete obstruction: Secondary | ICD-10-CM

## 2021-08-20 NOTE — Telephone Encounter (Signed)
[facility] ? ?Talked to [place], nephew, Shahir is out of the hospital again. He has been admitted twice now , at [facility] with dehydration and fluid overload, sounds like some heart failure with peripheral edema. Now better. Leg swelling better. Received lasix and albumin in the hospital. Ostomy output is better and taking imodium regularly '4mg'$  QID.  ? ?Ostomy is doing good otherwise, staples are out. ?Will see Thursday May 27 @ 1:30 and will order a full set of labs at that time to make sure he is doing better. ?Will get latest admission documentation too. ? ?Curlene Labrum, MD ?[name] ?[place],  [phone] ?[phone] (office) ? ? ?

## 2021-08-23 ENCOUNTER — Encounter: Payer: Medicare Other | Admitting: General Surgery

## 2021-08-23 NOTE — Progress Notes (Unsigned)
[facility] Surgical Clinic Note  ? ?HPI:  ?66 y.o. Male presents to clinic for ***post-op follow-up evaluation of ***. Patient reports ***, denies ***. ? ?Review of Systems:  ?*** ?All other review of systems: otherwise negative  ? ?Vital Signs:  ?There were no vitals taken for this visit.  ? ?Physical Exam:  ?Physical Exam ? ?Laboratory studies: {Labs :18171}  ? ?Imaging:  ?***  ? ?Assessment:  ?66 y.o. yo Male with ***. ? ?Plan:  ?- ***  ?- *** ?- *** ?- Follow up ? ?All of the above recommendations were discussed with the patient ***and patient's family, and all of patient's ***and family's questions were answered to ***his/her/their expressed satisfaction. ? ?Curlene Labrum, MD ?[facility] ?[place], [place] [phone] ?[phone] (office) ? ?ES3 -HPI 1 ROS 1, EXAM 2-7*** ?ES4 HPI 4, ROS 2, PSFH 1, EXAM 2-7***  ?

## 2021-08-28 ENCOUNTER — Encounter: Payer: Self-pay | Admitting: General Surgery

## 2021-08-28 ENCOUNTER — Ambulatory Visit (INDEPENDENT_AMBULATORY_CARE_PROVIDER_SITE_OTHER): Payer: Medicare Other | Admitting: General Surgery

## 2021-08-28 VITALS — BP 114/77 | HR 83 | Temp 98.2°F | Resp 14 | Ht 68.0 in | Wt 160.0 lb

## 2021-08-28 DIAGNOSIS — C180 Malignant neoplasm of cecum: Secondary | ICD-10-CM | POA: Insufficient documentation

## 2021-08-28 DIAGNOSIS — C187 Malignant neoplasm of sigmoid colon: Secondary | ICD-10-CM

## 2021-08-28 DIAGNOSIS — Z932 Ileostomy status: Secondary | ICD-10-CM | POA: Insufficient documentation

## 2021-08-28 NOTE — Patient Instructions (Signed)
Continue ostomy care. You can change bag less often if it is not leaking. ?Neosporin  to the raw edge.  ? ?Ileostomy Output: ? ?Monitor how much output you have from your ileostomy a day and drink at least twice that amount in fluids as you will also have losses from sweat and urine.  ?Example: 1 L output you need 2 L of intake  ? ?If you are having more than 1 liter of output a day, please start these medications in a stepwise fashion. ?Metamucil 34 g packet in 30 mL of water daily.  ?Imodium 4 mg four times a day; 30 minutes before meals and before bed.  ?Lomotil 2.5 mg four times a day; 30 minutes before meals and before bed.  ?Cholestyramine 4 g four times a day; 30 minutes before meals and before bed.  ?(You will need prescriptions for these medications.) ? ?Diet Changes: ?- Chew food well.  ?- Eat low sugar foods and drinks.  ?- Eat salty foods and add salt to meals and snacks.  ?- Eat smaller more frequent meals and snacks.  ?- Drink fluids ? hour before or after meals, not with food.  ?- Avoid alcohol and caffeine.  ?- Eat more soluble fiber which forms a gel when mixed with water and slows movement.  ?- Good sources of soluble fiber include: peeled sweet potatoes, applesauce, refried beans, wheat and oat bran.  ? ?Try adding these foods that naturally thicken stool to your meals:  ?- Applesauce  ?- Cream of rice  ?- Peanut butter (creamy)  ?- Bananas  ?- Marshmallows ?- Rice  ?- Cheese  ?- Mashed potatoes  ?- Soda crackers  ?- Tapioca    ? ?If you are still having trouble with output, please let us know in your office [phone]).   ?You may need to adjust these medications as you have less output.  ? ?

## 2021-08-28 NOTE — Progress Notes (Signed)
[facility] Note  ? ?HPI:  ?66 y.o. 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 He had issue with dehydration on discharge and was admitted to [place] twice. He is better now. His intake is better and output better on '8mg'$  of imodium (taking 4 pills) and metamucil.  ? ?No longer taking eliquis he says. ?  ?He is here with Suezanne Jacquet his POA/ nephrew. They do not want communication to be sent to [employer].  ? ?Review of Systems:  ?No pain ?Reduced ostomy output  ?All other review of systems: otherwise negative  ? ?Vital Signs:  ?BP 114/77   Pulse 83   Temp 98.2 ?F (36.8 ?C) (Oral)   Resp 14   Ht '5\' 8"'$  (1.727 m)   Wt 160 lb (72.6 kg)   SpO2 98%   BMI 24.33 kg/m?   ? ?Physical Exam:  ?Physical Exam ?Vitals reviewed.  ?Constitutional:   ?   Appearance: He is cachectic.  ?HENT:  ?   Head: Normocephalic.  ?Cardiovascular:  ?   Rate and Rhythm: Normal rate.  ?Pulmonary:  ?   Effort: Pulmonary effort is normal.  ?Abdominal:  ?   General: There is no distension.  ?   Palpations: Abdomen is soft.  ?   Tenderness: There is no abdominal tenderness.  ?   Comments: Ileostomy pink and erythema around edge, bag replaced and wafer opening demonstrated to be close to ostomy as possible   ? ? ? ? ?Assessment:  ?66 y.o. 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  Can removed, margins good on pathology 0/35 nodes and no deposits noted. Concern for microperforation at the sigmoid colon in the report. Seeing Oncology later this week.  ? ?Plan:  ?Sent Dr. Hinton Rao, Oncology, a message about lab work ? ?Continue ostomy care. You can change bag less often if it is not leaking. ?Neosporin  to the raw edge.  ? ?Ileostomy Output: ? ?Monitor how much output you have from your ileostomy a day and drink at least twice that amount in fluids as you will also have losses from sweat and urine.  ?Example:  1 L output you need 2 L of intake  ? ?If you are having more than 1 liter of output a day, please start these medications in a stepwise fashion. ?Metamucil 34 g packet in 30 mL of water daily.  ?Imodium 4 mg four times a day; 30 minutes before meals and before bed.  ?Lomotil 2.5 mg four times a day; 30 minutes before meals and before bed.  ?Cholestyramine 4 g four times a day; 30 minutes before meals and before bed.  ?(You will need prescriptions for these medications.) ? ?Diet Changes: ?- Chew food well.  ?- Eat low sugar foods and drinks.  ?- Eat salty foods and add salt to meals and snacks.  ?- Eat smaller more frequent meals and snacks.  ?- Drink fluids ? hour before or after meals, not with food.  ?- Avoid alcohol and caffeine.  ?- Eat more soluble fiber which forms a gel when mixed with water and slows movement.  ?- Good sources of soluble fiber include: peeled sweet potatoes, applesauce, refried beans, wheat and oat bran.  ? ?Try adding these foods that naturally thicken stool to your meals:  ?- Applesauce  ?- Cream of rice  ?- Peanut butter (creamy)  ?- Bananas  ?- Marshmallows ?-  Rice  ?- Cheese  ?- Mashed potatoes  ?- Soda crackers  ?- Tapioca    ? ?If you are still having trouble with output, please let us know in your office [phone]).   ?You may need to adjust these medications as you have less output.  ? ?Future Appointments  ?Date Time Provider [place]  ?08/31/2021 10:00 AM [facility] None  ?08/31/2021 10:30 AM Derwood Kaplan, MD [facility] None  ?09/18/2021  9:30 AM Virl Cagey, MD [facility] None  ? ? ? ?Curlene Labrum, MD ?[facility] ?[place], [place] [address] ?[phone] (office) ? ? ?

## 2021-08-30 ENCOUNTER — Other Ambulatory Visit: Payer: Self-pay | Admitting: Oncology

## 2021-08-30 DIAGNOSIS — C187 Malignant neoplasm of sigmoid colon: Secondary | ICD-10-CM

## 2021-08-31 ENCOUNTER — Inpatient Hospital Stay: Payer: Medicare Other | Attending: Oncology | Admitting: Oncology

## 2021-08-31 ENCOUNTER — Other Ambulatory Visit: Payer: Self-pay | Admitting: Oncology

## 2021-08-31 ENCOUNTER — Other Ambulatory Visit: Payer: Self-pay | Admitting: Hematology and Oncology

## 2021-08-31 ENCOUNTER — Other Ambulatory Visit: Payer: Self-pay

## 2021-08-31 ENCOUNTER — Inpatient Hospital Stay: Payer: Medicare Other

## 2021-08-31 ENCOUNTER — Encounter: Payer: Self-pay | Admitting: Oncology

## 2021-08-31 VITALS — BP 121/78 | HR 82 | Temp 98.3°F | Resp 17 | Ht 66.9 in | Wt 161.7 lb

## 2021-08-31 DIAGNOSIS — R300 Dysuria: Secondary | ICD-10-CM | POA: Insufficient documentation

## 2021-08-31 DIAGNOSIS — C180 Malignant neoplasm of cecum: Secondary | ICD-10-CM | POA: Insufficient documentation

## 2021-08-31 DIAGNOSIS — C187 Malignant neoplasm of sigmoid colon: Secondary | ICD-10-CM

## 2021-08-31 DIAGNOSIS — Z7982 Long term (current) use of aspirin: Secondary | ICD-10-CM | POA: Diagnosis not present

## 2021-08-31 DIAGNOSIS — D509 Iron deficiency anemia, unspecified: Secondary | ICD-10-CM | POA: Diagnosis not present

## 2021-08-31 DIAGNOSIS — Z800 Family history of malignant neoplasm of digestive organs: Secondary | ICD-10-CM | POA: Insufficient documentation

## 2021-08-31 DIAGNOSIS — I4891 Unspecified atrial fibrillation: Secondary | ICD-10-CM | POA: Insufficient documentation

## 2021-08-31 DIAGNOSIS — I10 Essential (primary) hypertension: Secondary | ICD-10-CM | POA: Insufficient documentation

## 2021-08-31 DIAGNOSIS — C19 Malignant neoplasm of rectosigmoid junction: Secondary | ICD-10-CM | POA: Diagnosis present

## 2021-08-31 DIAGNOSIS — Z932 Ileostomy status: Secondary | ICD-10-CM | POA: Insufficient documentation

## 2021-08-31 DIAGNOSIS — D500 Iron deficiency anemia secondary to blood loss (chronic): Secondary | ICD-10-CM

## 2021-08-31 DIAGNOSIS — Z79899 Other long term (current) drug therapy: Secondary | ICD-10-CM | POA: Insufficient documentation

## 2021-08-31 LAB — BASIC METABOLIC PANEL
BUN: 6 (ref 4–21)
CO2: 23 — AB (ref 13–22)
Chloride: 103 (ref 99–108)
Creatinine: 0.7 (ref 0.6–1.3)
Glucose: 104
Potassium: 4 mEq/L (ref 3.5–5.1)
Sodium: 137 (ref 137–147)

## 2021-08-31 LAB — COMPREHENSIVE METABOLIC PANEL
Albumin: 3.5 (ref 3.5–5.0)
Calcium: 10 (ref 8.7–10.7)

## 2021-08-31 LAB — CBC AND DIFFERENTIAL
HCT: 28 — AB (ref 41–53)
Hemoglobin: 9.2 — AB (ref 13.5–17.5)
Neutrophils Absolute: 4.09
Platelets: 397 10*3/uL (ref 150–400)
WBC: 6.1

## 2021-08-31 LAB — CBC
MCV: 94 (ref 80–94)
RBC: 3 — AB (ref 3.87–5.11)

## 2021-08-31 LAB — HEPATIC FUNCTION PANEL
ALT: 17 U/L (ref 10–40)
AST: 20 (ref 14–40)
Alkaline Phosphatase: 70 (ref 25–125)
Bilirubin, Total: 0.4

## 2021-08-31 LAB — PROTEIN, TOTAL: Total Protein: 6.2 g/dL — AB (ref 6.3–8.2)

## 2021-08-31 MED ORDER — CIPROFLOXACIN HCL 500 MG PO TABS: 500.0000 mg | ORAL_TABLET | Freq: Two times a day (BID) | ORAL | 0 refills | Status: AC

## 2021-09-01 LAB — CEA: CEA: 1.6 ng/mL (ref 0.0–4.7)

## 2021-09-01 NOTE — Progress Notes (Signed)
[name]  ?[address] ?[place],  [place]  [address] ?[phone] ? ?Clinic Day:08/31/21 ? ?Referring physician: Murlean Iba, MD ? ? ?ASSESSMENT & PLAN:  ? ?Adenocarcinoma of the sigmoid colon ?This was a large tumor, 8.3 cm, grade 2 but with 35 negative nodes and clear margins.  He did have microperforation with extension through the wall into the pericolonic adipose tissue for a T4a N0 M0, stage IIB.  Pathology showed this was arising within a tubular adenoma.  This was nearly obstructing.  We discussed the fact that he is at high risk for recurrence and so we reviewed the options of adjuvant chemotherapy.  I think his best option would be capecitabine for 6 months. ? ?Adenocarcinoma of the cecum ?This was also a large tumor at 7.4 cm, grade 2 with clear margins and negative nodes, for a T3 N0 M0,, stage IIA. ? ?Postoperative ileus ?He has high ileostomy output and required readmission to the hospital on 2 occasions since his surgery. ? ?Iron deficiency anemia ?He is on supplement with a women's multivitamin and I suggested a prenatal vitamin might have even more iron content.  I think they are concerned that he will get too much stomach upset with an iron pill and so I gave him a list of foods that are high in iron to include in his diet.  His hemoglobin is already coming up since discharge.  He was given transfusion of packed red blood cells while in the hospital and discharged with a hemoglobin of 8.2, now up to 9.2. ? ?Thrombocytosis ?This was from iron deficiency anemia and has resolved. ? ?Dysuria for the last few days ?He did have a catheter in during his hospitalization and so we will get a urinalysis and urine culture.  However his symptoms are severe enough that I will go ahead and start him on Cipro 500 mg twice daily. ? ?We will evaluate his urine and call him on the culture results.  He knows to increase iron in his diet and try prenatal vitamins.  I  explained that he is at high risk for recurrence and the most aggressive approach would be FOLFOX or XELOX.  However this would have increased toxicities and his performance status is not the best, I would graded 1-2.  He is staying with his nephew in order to have enough help with his care.  I therefore suggested that he consider capecitabine as a more practical option for 6 months.  I reviewed the schedule and potential toxicities and we will have a pharmacist meet with him to discuss this further.  The other option would be no further treatment but close observation with checks every 3 months.  He would like to try the capecitabine oral chemotherapy and he can always stop it if he has excessive toxicities.  I recommend we go with 850 mg per metered squared which calculates out to 1500 mg twice daily, for 2 weeks on and 1 week off.  We will plan regular follow-up as he does this and so I will schedule him to return in 3 to 4 weeks with CBC and comprehensive metabolic profile.  A CEA was drawn and is normal at 1.6.  I discussed the assessment and treatment plan with the patient and his niece.  They were provided an opportunity to ask questions and all were answered.  The patient agreed with the plan and demonstrated an understanding of the instructions.  The patient was advised to  call back if the symptoms worsen or if the condition fails to improve as anticipated. ? ?Thank you for the opportunity participate in the care of your patients. ? ?I provided 60 minutes of face-to-face time during this this encounter and > 50% was spent counseling as documented under my assessment and plan.  ? ? ?Derwood Kaplan, MD ?[facility] ?[place] ?[place] [address] ?Dept: [phone] ?Dept Fax: [phone]  ?Derwood Kaplan, MD   ?5/6/20235:35 PM ? ?CHIEF COMPLAINT:  ?CC: Adenocarcinoma of the colon  ? ?Current Treatment:   Oral chemotherapy with  capecitabine ? ? ?HISTORY OF PRESENT ILLNESS:  ?Todd Richardson is a 66 y.o. male with a history of iron deficiency anemia who is referred in consultation with Dr. Blake Divine for assessment and management of newly diagnosed bilateral adenocarcinoma of the colon. He tells me this started as diarrhea which was dark in color but he was not sure whether this was bleeding.  His family doctor told him he was anemic and colonoscopy on April 4 revealed an obstructing sigmoid colon carcinoma with a circumferential mass.  Pathology revealed this to be a moderately differentiated adenocarcinoma, arising within a tubular adenoma.  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  He was taken to surgery at [facility] by Dr. Blake Divine on April 6 and had a subtotal colectomy with an end ileostomy.  Final pathology revealed the sigmoid adenocarcinoma to be 8.3 cm in diameter and extending through the wall into the pericolonic adipose tissue with microperforation.  The lesion of the cecum was 7.4 cm and both were grade 2 and had clear margins.  35 negative nodes were removed.  He was having high output from his ileostomy and was admitted to [facility] on April the [facility] for complications.  A CT scan revealed pneumoperitoneum and ascites with severe edema.  He was found to have hyponatremia, hypokalemia, and severe low protein state.  He seemed to improve but was readmitted to the hospital on April 20 with atrial fibrillation and placed on Cardizem.  He had had atrial fibrillation in the past and had been on Eliquis but this has been stopped due to his iron deficiency anemia and he is on aspirin.  He had severe edema but also had a very low protein state and so it has taken some time to improve.  He had been very weak and his hemoglobin at the time of discharge on April 23 was 8.2.  He was given transfusion of 2 units of packed cells during his surgery in [place] and 1  unit at [facility]. ? ?INTERVAL HISTORY:  ?I have reviewed his chart and materials related to his cancer extensively and collaborated history with the patient. Summary of oncologic history is as follows: ?Oncology History  ?Cecal cancer ([place])  ?07/31/2021 Cancer Staging  ? Staging form: Colon and Rectum, AJCC 8th Edition ?- Clinical stage from 07/31/2021: Stage IIA (cT3(2), cN0, cM0) - Signed by Derwood Kaplan, MD on 09/01/2021 ?Histopathologic type: Adenocarcinoma, NOS ?Stage prefix: Initial diagnosis ?Total positive nodes: 0 ?Total nodes examined: 35 ?Histologic grade (G): G2 ?Histologic grading system: 4 grade system ?Laterality: Right ?Bilateral cancer: Yes ?Tumor size (mm): 74 ?Multiple tumors: Yes ?Number of tumors: 2 ?Lymph-vascular invasion (LVI): LVI not present (absent)/not identified ?Diagnostic confirmation: Positive histology ?Specimen type: Excision ?Staged by: Managing physician ?Tumor deposits (TD): Absent ?Perineural  invasion (PNI): Absent ?Microsatellite instability (MSI): Stable ?KRAS mutation: Not assessed ?NRAS mutation: Not assessed ?BRAF mutation: Not assessed ?Stage used in treatment planning: Yes ?National guidelines used in treatment planning: Yes ?Type of national guideline used in treatment planning: NCCN ? ?  ?08/28/2021 Initial Diagnosis  ? Cecal cancer ([place]) ? ?  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 Cancer of rectosigmoid (colon) ([place]) ? ?  ?07/31/2021 Cancer Staging  ? Staging form: Colon and Rectum, AJCC 8th Edition ?- Clinical stage from 07/31/2021: Stage IIB (cT4a(2), cN0, cM0) - Signed by Derwood Kaplan, MD on 09/01/2021 ?Histopathologic type: Adenocarcinoma, NOS ?Stage prefix: Initial diagnosis ?Total positive nodes: 0 ?Total nodes examined: 35 ?Histologic grade (G): G2 ?Histologic grading system: 4 grade system ?Laterality: Left ?Bilateral cancer: Yes ?Tumor size (mm): 83 ?Multiple tumors: Yes ?Number of tumors: 2 ?Lymph-vascular invasion  (LVI): LVI not present (absent)/not identified ?Diagnostic confirmation: Positive histology ?Specimen type: Excision ?Staged by: Managing physician ?Tumor deposits (TD): Absent ?Carcinoembryonic antigen (CE

## 2021-09-03 ENCOUNTER — Encounter: Payer: Self-pay | Admitting: Oncology

## 2021-09-03 ENCOUNTER — Telehealth: Payer: Self-pay

## 2021-09-03 ENCOUNTER — Other Ambulatory Visit: Payer: Self-pay | Admitting: Oncology

## 2021-09-03 ENCOUNTER — Telehealth: Payer: Self-pay | Admitting: Pharmacy Technician

## 2021-09-03 ENCOUNTER — Other Ambulatory Visit (HOSPITAL_COMMUNITY): Payer: Self-pay

## 2021-09-03 DIAGNOSIS — C19 Malignant neoplasm of rectosigmoid junction: Secondary | ICD-10-CM

## 2021-09-03 DIAGNOSIS — C187 Malignant neoplasm of sigmoid colon: Secondary | ICD-10-CM

## 2021-09-03 MED ORDER — CAPECITABINE 500 MG PO TABS
1500.0000 mg | ORAL_TABLET | Freq: Two times a day (BID) | ORAL | 0 refills | Status: DC
  Filled 2021-09-03: qty 84, 14d supply, fill #0

## 2021-09-03 NOTE — Progress Notes (Signed)
START ON PATHWAY REGIMEN - Colorectal ? ? ?  A cycle is every 21 days: ?    Capecitabine  ? ?**Always confirm dose/schedule in your pharmacy ordering system** ? ?Patient Characteristics: ?Postoperative without Neoadjuvant Therapy, M0 (Pathologic Staging), Colon, Stage IIB/C ?Tumor Location: Colon ?Therapeutic Status: Postoperative without Neoadjuvant Therapy, M0 (Pathologic Staging) ?AJCC M Category: cM0 ?AJCC T Category: pT4a ?AJCC N Category: pN0 ?AJCC 8 Stage Grouping: IIB ?Intent of Therapy: ?Curative Intent, Discussed with Patient ?

## 2021-09-03 NOTE — Telephone Encounter (Signed)
Oral Oncology Pharmacist Encounter ? ?Received new prescription for capecitabine (Xeloda) for the treatment of colorectal cancer, planned duration 6 months or until disease progression or unacceptable toxicity. ? ?Labs from 08/31/21 assessed, no interventions needed. ? ?Current medication list in Epic reviewed, DDIs with Xeloda identified: ?- pantoprazole: PPIs can decrease efficacy of xeloda ?- antacids can increase concentration of xeloda (no actions needed) ?- ciprofloxacin- patient only on for 10 days which will be completed on 09/10/21 ? ?Evaluated chart and no patient barriers to medication adherence noted.  ? ?Patient agreement for treatment documented in MD note on 08/31/2021. ? ?Prescription has been e-scribed to the [facility] for benefits analysis and approval. ? ?Oral Oncology Clinic will continue to follow for insurance authorization, copayment issues, initial counseling and start date. ? ?Drema Halon, PharmD ?Hematology/Oncology Clinical Pharmacist ?[facility] ?[phone] ?09/03/2021 1:52 PM ? ?

## 2021-09-03 NOTE — Telephone Encounter (Signed)
Received New start notification for  Capecitabine '500mg'$  . Will update as we work through the benefits process.  ?

## 2021-09-03 NOTE — Telephone Encounter (Signed)
Submitted a Prior Authorization request to [facility] for  Capecitabine '500mg'$   via CoverMyMeds. Will update once we receive a response. ? ? ?Key: [id number] - PA Case ID: [id number]  ?

## 2021-09-03 NOTE — Telephone Encounter (Signed)
Received notification from [facility] regarding a prior authorization for  Capecitabine '500mg'$  . Authorization has been APPROVED from 04/29/21 to 04/28/22.  ? ?Per test claim, copay for 14 days supply is $28.90 ? ?Authorization # Key: [id number] - PA Case ID: [id number]  ? ? ?

## 2021-09-03 NOTE — Telephone Encounter (Signed)
1st shipment set up to deliver on 5/10 ?

## 2021-09-04 ENCOUNTER — Ambulatory Visit: Payer: Medicare Other | Admitting: Dietician

## 2021-09-04 ENCOUNTER — Telehealth: Payer: Self-pay

## 2021-09-04 ENCOUNTER — Other Ambulatory Visit (HOSPITAL_COMMUNITY): Payer: Self-pay

## 2021-09-04 NOTE — Telephone Encounter (Signed)
[name] Kitchen the nephew states that patient is much better than he was and is tolerating Cipro. Also the symptoms are getting better. He states they will call us if anything changes/ ?

## 2021-09-04 NOTE — Telephone Encounter (Signed)
-----   Message from Derwood Kaplan, MD sent at 09/03/2021 12:15 PM EDT ----- ?Regarding: call ?Tell him he does have UTI, they didn't test the ab he is on.  Usually we use something in the penicillin family for the bacteria he has, Enterococcus.  So if he is doing better with the cipro, just stay on it. ?But if not,  let me know and I can send in different ab ? ?

## 2021-09-04 NOTE — Progress Notes (Signed)
Nutrition Assessment ? ?Reason for Assessment: Referral from Dr. Hinton Rao ? ? ?ASSESSMENT: Patient is 66 year old male with adenocarcinoma of the sigmoid colon and cecum s/p ileostomy.  He has high ileostomy output and required readmission to the hospital on 2 occasions since his surgery. His PMHX includes HTN, Afib, bowel obstruction, and iron deficiency anemia.   ? ?He reports he is feeling better and appetite is improving. He is allergic to apples. His ileostomy is still often than very watery.  He feels good at his CBW, and is starting to walk more and regain some strength. ? ?Usual PO: ?Breakfast: ?Eggs and toast or  Ensure (high protein) ? ?Lunch (noon): leftovers or sandwich ? ?Dinner: chicken, beans, eggs ?Likes rice, green beans, mashed potatoes ? ?Sometimes snacks on peanut butter crackers or marshmallows  ?Doesn't like bananas but willing to try occasionally to solidify stools ? ?Fluids: coffee in morning, and water (2 bottles) 2 Ensure/day sometimes cranberry juice ? ?Medications: metamucil in mornings, taking Imodium about 6 times a day, using prenatal MVI daily for anemia ? ? ?Labs: 08/31/21  Hgb 9.2 (increasing 8.3 last month) ? ? ?Anthropometrics: He had significant weight loss 29# (15.42%) in one month in March 2023 ? ?Height:  66.9" ?Weight:   ?08/31/21  161.75# ?08/28/21  160# ?07/31/21  159# ?07/04/21  188# ? ?UBW: 190# ?BMI: 25.4 ? ? ?Estimated Energy Needs ? ?Kcals: 2200-2500 ?Protein: 73-88 ?Fluid: >/= 2.5 L ? ?INTERVENTION: Encourage weight maintenance and increase in soluble fibers for solidifying stools. Offered to mail nutrition tips for ileostomy but he didn't know address of nephew he is staying with. Offered to reach out to [place] POA/nephew to get address. Provided contact information for questions. ? ? ?MONITORING, EVALUATION, GOAL: Weight trends, PO intake, nutrition impact symptoms and las ? ? ?Next Visit: Next month after MD follow up ? ?April Manson, RDN, LDN ?Registered Dietitian, [facility] ?Part Time Remote (Usual office hours: Tuesday-Thursday) ?Office: [phone] ? ? ?   ?

## 2021-09-04 NOTE — Telephone Encounter (Signed)
Attempted to contact patient. No answer and no VM.  

## 2021-09-05 ENCOUNTER — Other Ambulatory Visit (HOSPITAL_COMMUNITY): Payer: Self-pay

## 2021-09-05 ENCOUNTER — Encounter: Payer: Self-pay | Admitting: Oncology

## 2021-09-05 ENCOUNTER — Other Ambulatory Visit: Payer: Self-pay | Admitting: Oncology

## 2021-09-05 MED ORDER — CAPECITABINE 500 MG PO TABS
1500.0000 mg | ORAL_TABLET | Freq: Two times a day (BID) | ORAL | 0 refills | Status: DC
  Filled 2021-09-05: qty 84, 21d supply, fill #0

## 2021-09-05 NOTE — Telephone Encounter (Signed)
Oral Chemotherapy Pharmacist Encounter ? ?I spoke with patient in person for overview of: Xeloda (capecitabine) for the  treatment of colorectal cancer, planned duration until disease progression or unacceptable drug toxicity. ? ?Counseled patient on administration, dosing, side effects, monitoring, drug-food interactions, safe handling, storage, and disposal. ? ?Patient will take Xeloda '500mg'$  tablets, 3 tablets ('1500mg'$ ) by mouth in AM and 3 tabs ('1500mg'$ ) by mouth in PM, within 30 minutes of finishing meals, for 14 days on, 7 days off, repeated every 21 days. ? ?Xeloda start date: 09/07/2021 ? ?Adverse effects include but are not limited to: fatigue, decreased blood counts, GI upset, diarrhea, mouth sores, and hand-foot syndrome. ? ?Patient has anti-emetic on hand and knows to take it if nausea develops.   ?Patient will obtain anti diarrheal and alert the office of 4 or more loose stools above baseline. ? ?Reviewed with patient importance of keeping a medication schedule and plan for any missed doses. No barriers to medication adherence identified. ? ?Medication reconciliation performed and medication/allergy list updated. No new medications, patient is not taking pantoprazole. ? ?This will ship from the [facility] on 09/05/2021 to deliver to patient's home on 09/06/2021. ? ?Patient informed the pharmacy will reach out 5-7 days prior to needing next fill of Xeloda to coordinate continued medication acquisition to prevent break in therapy.  ? ?All questions answered. ? ?Mr. Eddings voiced understanding and appreciation.  ? ?Medication education handout was given to patient and caregiver. Patient knows to call the office with questions or concerns. Oral Chemotherapy Clinic phone number provided to patient.  ? ?Drema Halon, PharmD ?Hematology/Oncology Clinical Pharmacist ?[facility] ?[phone] ?

## 2021-09-06 ENCOUNTER — Encounter: Payer: Self-pay | Admitting: Family Medicine

## 2021-09-09 ENCOUNTER — Other Ambulatory Visit: Payer: Self-pay | Admitting: Oncology

## 2021-09-11 ENCOUNTER — Ambulatory Visit (HOSPITAL_COMMUNITY): Payer: Medicare Other | Admitting: Nurse Practitioner

## 2021-09-11 ENCOUNTER — Other Ambulatory Visit (HOSPITAL_COMMUNITY): Payer: Self-pay

## 2021-09-14 ENCOUNTER — Telehealth: Payer: Self-pay

## 2021-09-14 NOTE — Telephone Encounter (Signed)
I spoke with Todd Richardson Kitchen, pt's nephew and caregiver. He reports that Todd Richardson is taking his Xeloda about 730 in the morning and again at 7pm, both times with food. No missed doses. He admits he has noticed increased fatigue. Pt denies N/V, mouth sores, diarrhea, skin rashes/itching, headache, and fevers. We confirmed his next appt which was originally scheduled for the 23rd, but he has appt with wound care in [place] that day. I moved our appt's to 5/24 @ 230p.

## 2021-09-18 ENCOUNTER — Encounter: Payer: Medicare Other | Admitting: General Surgery

## 2021-09-18 ENCOUNTER — Ambulatory Visit (HOSPITAL_COMMUNITY)
Admission: RE | Admit: 2021-09-18 | Discharge: 2021-09-18 | Disposition: A | Discharge status: Home / Self Care | Payer: Medicare Other | Source: Ambulatory Visit | Attending: Orthopedic Surgery | Admitting: Orthopedic Surgery

## 2021-09-18 ENCOUNTER — Ambulatory Visit: Payer: Medicare Other | Admitting: Hematology and Oncology

## 2021-09-18 ENCOUNTER — Other Ambulatory Visit: Payer: Medicare Other

## 2021-09-19 ENCOUNTER — Encounter: Payer: Self-pay | Admitting: Hematology and Oncology

## 2021-09-19 ENCOUNTER — Inpatient Hospital Stay: Payer: Medicare Other

## 2021-09-19 ENCOUNTER — Inpatient Hospital Stay: Payer: Medicare Other | Admitting: Hematology and Oncology

## 2021-09-19 DIAGNOSIS — D500 Iron deficiency anemia secondary to blood loss (chronic): Secondary | ICD-10-CM

## 2021-09-19 DIAGNOSIS — C180 Malignant neoplasm of cecum: Secondary | ICD-10-CM | POA: Diagnosis not present

## 2021-09-19 DIAGNOSIS — C19 Malignant neoplasm of rectosigmoid junction: Secondary | ICD-10-CM

## 2021-09-19 DIAGNOSIS — C187 Malignant neoplasm of sigmoid colon: Secondary | ICD-10-CM

## 2021-09-19 DIAGNOSIS — Z932 Ileostomy status: Secondary | ICD-10-CM | POA: Diagnosis not present

## 2021-09-19 LAB — HEPATIC FUNCTION PANEL
ALT: 19 U/L (ref 10–40)
AST: 26 (ref 14–40)
Alkaline Phosphatase: 64 (ref 25–125)
Bilirubin, Total: 0.5

## 2021-09-19 LAB — CBC AND DIFFERENTIAL
HCT: 31 — AB (ref 41–53)
Hemoglobin: 10.2 — AB (ref 13.5–17.5)
Neutrophils Absolute: 3.19
Platelets: 384 10*3/uL (ref 150–400)
WBC: 5.4

## 2021-09-19 LAB — BASIC METABOLIC PANEL
BUN: 13 (ref 4–21)
CO2: 30 — AB (ref 13–22)
Chloride: 101 (ref 99–108)
Creatinine: 0.9 (ref 0.6–1.3)
Glucose: 100
Potassium: 3.9 mEq/L (ref 3.5–5.1)
Sodium: 138 (ref 137–147)

## 2021-09-19 LAB — CBC: RBC: 3.21 — AB (ref 3.87–5.11)

## 2021-09-19 LAB — COMPREHENSIVE METABOLIC PANEL
Albumin: 4 (ref 3.5–5.0)
Calcium: 9.3 (ref 8.7–10.7)

## 2021-09-19 NOTE — Assessment & Plan Note (Signed)
He has high ileostomy output and required readmission to the hospital on 2 occasions since his surgery.

## 2021-09-19 NOTE — Progress Notes (Signed)
Patient Care Team: [name], MD as PCP - General (Family Medicine) Alba Destine, MD as Rounding Team (Internal Medicine)  Clinic Day:  09/19/2021  Referring physician: [name], MD  ASSESSMENT & PLAN:   Assessment & Plan: Cancer of rectosigmoid (colon) [facility]) This was a large tumor, 8.3 cm, grade 2 but with 35 negative nodes and clear margins.  He did have microperforation with extension through the wall into the pericolonic adipose tissue for a T4a N0 M0, stage IIB.  Pathology showed this was arising within a tubular adenoma.  This was nearly obstructing.  He was started on capecitabine 750 mg twice daily for 14 days on and 7 days off every 21 days. He is tolerating treatment well with minimal side effects. He will return to clinic in 4 weeks for repeat evaluation.  Cecal cancer ([place]) This was also a large tumor at 7.4 cm, grade 2 with clear margins and negative nodes, for a T3 N0 M0,, stage IIA.  Ileostomy in place [facility]) He has high ileostomy output and required readmission to the hospital on 2 occasions since his surgery.  Iron deficiency anemia due to chronic blood loss He is on supplement with a prenatal vitamin.  He is tolerating this well and hemoglobin is up to 10.2 from 9.2 last visit.    The patient understands the plans discussed today and is in agreement with them.  He knows to contact our office if he develops concerns prior to his next appointment.    Melodye Ped, NP  [place] [address] [place] [address] Dept: [phone] Dept Fax: [phone]   Orders Placed This Encounter  Procedures   CBC and differential    This external order was created through the Results Console.   CBC    This external order was created through the Results Console.   Basic metabolic panel    This external order was created through the Results Console.   Comprehensive metabolic  panel    This external order was created through the Results Console.   Hepatic function panel    This external order was created through the Results Console.      CHIEF COMPLAINT:  CC: A 66 year old male with history of colon cancer here for 4 week evaluation   Current Treatment:  Capecitabine  INTERVAL HISTORY:  Todd Richardson is here today for repeat clinical assessment. He denies fevers or chills. He denies pain. His appetite is good. His weight has increased 5 pounds over last 4 weeks .  I have reviewed the past medical history, past surgical history, social history and family history with the patient and they are unchanged from previous note.  ALLERGIES:  is allergic to apple and apple juice.  MEDICATIONS:  Current Outpatient Medications  Medication Sig Dispense Refill   capecitabine (XELODA) 500 MG tablet Take 3 tablets (1,500 mg total) by mouth 2 (two) times daily after a meal. Take for 14 days on, 7 days off. Repeat every 21 days. 84 tablet 0   cetirizine (ZYRTEC) 10 MG tablet Take 10 mg by mouth as needed for allergies.     loperamide (IMODIUM) 2 MG capsule Take 2 capsules (4 mg total) by mouth 4 (four) times daily -  with meals and at bedtime. 240 capsule 1   pantoprazole (PROTONIX) 40 MG tablet Take 1 tablet (40 mg total) by mouth daily. (Patient not taking: Reported on 09/05/2021) 30 tablet 1   psyllium (  HYDROCIL/METAMUCIL) 95 % PACK Take 1 packet by mouth daily. 240 each 2   tamsulosin (FLOMAX) 0.4 MG CAPS capsule Take 1 capsule (0.4 mg total) by mouth 2 (two) times daily. 60 capsule 1   No current facility-administered medications for this visit.    HISTORY OF PRESENT ILLNESS:   Oncology History  Cecal cancer ([place])  07/31/2021 Cancer Staging   Staging form: Colon and Rectum, AJCC 8th Edition - Clinical stage from 07/31/2021: Stage IIA (cT3(2), cN0, cM0) - Signed by Derwood Kaplan, MD on 09/01/2021 Histopathologic type: Adenocarcinoma, NOS Stage prefix: Initial  diagnosis Total positive nodes: 0 Total nodes examined: 35 Histologic grade (G): G2 Histologic grading system: 4 grade system Laterality: Right Bilateral cancer: Yes Tumor size (mm): 74 Multiple tumors: Yes Number of tumors: 2 Lymph-vascular invasion (LVI): LVI not present (absent)/not identified Diagnostic confirmation: Positive histology Specimen type: Excision Staged by: Managing physician Tumor deposits (TD): Absent Perineural invasion (PNI): Absent Microsatellite instability (MSI): Stable KRAS mutation: Not assessed NRAS mutation: Not assessed BRAF mutation: Not assessed Stage used in treatment planning: Yes National guidelines used in treatment planning: Yes Type of national guideline used in treatment planning: NCCN    08/28/2021 Initial Diagnosis   Cecal cancer ([place])    Cancer of rectosigmoid (colon) ([place])  07/31/2021 Initial Diagnosis   Cancer of rectosigmoid (colon) ([place])    07/31/2021 Cancer Staging   Staging form: Colon and Rectum, AJCC 8th Edition - Clinical stage from 07/31/2021: Stage IIB (cT4a(2), cN0, cM0) - Signed by Derwood Kaplan, MD on 09/01/2021 Histopathologic type: Adenocarcinoma, NOS Stage prefix: Initial diagnosis Total positive nodes: 0 Total nodes examined: 35 Histologic grade (G): G2 Histologic grading system: 4 grade system Laterality: Left Bilateral cancer: Yes Tumor size (mm): 83 Multiple tumors: Yes Number of tumors: 2 Lymph-vascular invasion (LVI): LVI not present (absent)/not identified Diagnostic confirmation: Positive histology Specimen type: Excision Staged by: Managing physician Tumor deposits (TD): Absent Carcinoembryonic antigen (CEA) (ng/mL): 1.6 Perineural invasion (PNI): Absent Microsatellite instability (MSI): Stable KRAS mutation: Not assessed NRAS mutation: Not assessed BRAF mutation: Not assessed Stage used in treatment planning: Yes National guidelines used in treatment planning: Yes Type of national guideline  used in treatment planning: NCCN    09/12/2021 -  Chemotherapy   Patient is on Treatment Plan : COLORECTAL Capecitabine q21d          REVIEW OF SYSTEMS:   Constitutional: Denies fevers, chills or abnormal weight loss Eyes: Denies blurriness of vision Ears, nose, mouth, throat, and face: Denies mucositis or sore throat Respiratory: Denies cough, dyspnea or wheezes Cardiovascular: Denies palpitation, chest discomfort or lower extremity swelling Gastrointestinal:  Denies nausea, heartburn or change in bowel habits Skin: Denies abnormal skin rashes Lymphatics: Denies new lymphadenopathy or easy bruising Neurological:Denies numbness, tingling or new weaknesses Behavioral/Psych: Mood is stable, no new changes  All other systems were reviewed with the patient and are negative.   VITALS:  Blood pressure 112/80, pulse 87, temperature 98.6 F (37 C), temperature source Oral, resp. rate 18, height 5' 6.9" (1.699 m), weight 166 lb 14.4 oz (75.7 kg), SpO2 98 %.  Wt Readings from Last 3 Encounters:  09/19/21 166 lb 14.4 oz (75.7 kg)  08/31/21 161 lb 11.2 oz (73.3 kg)  08/28/21 160 lb (72.6 kg)    Body mass index is 26.22 kg/m.  Performance status (ECOG): 1 - Symptomatic but completely ambulatory  PHYSICAL EXAM:   GENERAL:alert, no distress and comfortable SKIN: skin color, texture, turgor are normal, no rashes or  significant lesions EYES: normal, Conjunctiva are pink and non-injected, sclera clear OROPHARYNX:no exudate, no erythema and lips, buccal mucosa, and tongue normal  NECK: supple, thyroid normal size, non-tender, without nodularity LYMPH:  no palpable lymphadenopathy in the cervical, axillary or inguinal LUNGS: clear to auscultation and percussion with normal breathing effort HEART: regular rate & rhythm and no murmurs and no lower extremity edema ABDOMEN:abdomen soft, non-tender and normal bowel sounds Musculoskeletal:no cyanosis of digits and no clubbing  NEURO: alert &  oriented x 3 with fluent speech, no focal motor/sensory deficits  LABORATORY DATA:  I have reviewed the data as listed    Component Value Date/Time   NA 138 09/19/2021 0000   K 3.9 09/19/2021 0000   CL 101 09/19/2021 0000   CO2 30 (A) 09/19/2021 0000   GLUCOSE 131 (H) 08/09/2021 0643   BUN 13 09/19/2021 0000   CREATININE 0.9 09/19/2021 0000   CREATININE 0.81 08/09/2021 0643   CALCIUM 9.3 09/19/2021 0000   PROT 6.2 (A) 08/31/2021 0000   ALBUMIN 4.0 09/19/2021 0000   AST 26 09/19/2021 0000   ALT 19 09/19/2021 0000   ALKPHOS 64 09/19/2021 0000   BILITOT 0.5 07/31/2021 1547   GFRNONAA >60 08/09/2021 0643   GFRAA >60 04/10/2018 1234    No results found for: SPEP, UPEP  Lab Results  Component Value Date   WBC 5.4 09/19/2021   NEUTROABS 3.19 09/19/2021   HGB 10.2 (A) 09/19/2021   HCT 31 (A) 09/19/2021   MCV 94 08/31/2021   PLT 384 09/19/2021      Chemistry      Component Value Date/Time   NA 138 09/19/2021 0000   K 3.9 09/19/2021 0000   CL 101 09/19/2021 0000   CO2 30 (A) 09/19/2021 0000   BUN 13 09/19/2021 0000   CREATININE 0.9 09/19/2021 0000   CREATININE 0.81 08/09/2021 0643   GLU 100 09/19/2021 0000      Component Value Date/Time   CALCIUM 9.3 09/19/2021 0000   ALKPHOS 64 09/19/2021 0000   AST 26 09/19/2021 0000   ALT 19 09/19/2021 0000   BILITOT 0.5 07/31/2021 1547       RADIOGRAPHIC STUDIES: I have personally reviewed the radiological images as listed and agreed with the findings in the report. No results found.

## 2021-09-19 NOTE — Assessment & Plan Note (Signed)
This was also a large tumor at 7.4 cm, grade 2 with clear margins and negative nodes, for a T3 N0 M0,, stage IIA.

## 2021-09-19 NOTE — Assessment & Plan Note (Addendum)
He is on supplement with a prenatal vitamin.  He is tolerating this well and hemoglobin is up to 10.2 from 9.2 last visit.

## 2021-09-19 NOTE — Assessment & Plan Note (Addendum)
This was a large tumor, 8.3 cm, grade 2 but with 35 negative nodes and clear margins.  He did have microperforation with extension through the wall into the pericolonic adipose tissue for a T4a N0 M0, stage IIB.  Pathology showed this was arising within a tubular adenoma.  This was nearly obstructing.  He was started on capecitabine 750 mg twice daily for 14 days on and 7 days off every 21 days. He is tolerating treatment well with minimal side effects. He will return to clinic in 4 weeks for repeat evaluation.

## 2021-09-20 ENCOUNTER — Other Ambulatory Visit (HOSPITAL_COMMUNITY): Payer: Self-pay

## 2021-09-25 ENCOUNTER — Encounter: Payer: Self-pay | Admitting: General Surgery

## 2021-09-25 ENCOUNTER — Other Ambulatory Visit: Payer: Self-pay | Admitting: Oncology

## 2021-09-25 ENCOUNTER — Ambulatory Visit (INDEPENDENT_AMBULATORY_CARE_PROVIDER_SITE_OTHER): Payer: Medicare Other | Admitting: General Surgery

## 2021-09-25 ENCOUNTER — Other Ambulatory Visit (HOSPITAL_COMMUNITY): Payer: Self-pay

## 2021-09-25 VITALS — BP 116/74 | HR 71 | Temp 97.4°F | Resp 14 | Ht 66.9 in | Wt 167.0 lb

## 2021-09-25 DIAGNOSIS — Z09 Encounter for follow-up examination after completed treatment for conditions other than malignant neoplasm: Secondary | ICD-10-CM

## 2021-09-25 DIAGNOSIS — C187 Malignant neoplasm of sigmoid colon: Secondary | ICD-10-CM

## 2021-09-25 NOTE — Progress Notes (Signed)
Subjective:     Todd Richardson  Patient here for postoperative visit.  He has gotten his ileostomy output under control.  He empties the bag approximately 5 times a day.  He has not had any further admissions for dehydration.  He is only taking Imodium to control his output.  He takes that approximately 5 times a day.  He denies any abdominal pain.  He has had no problems with his ileostomy. Objective:    BP 116/74   Pulse 71   Temp (!) 97.4 F (36.3 C) (Oral)   Resp 14   Ht 5' 6.9" (1.699 m)   Wt 167 lb (75.8 kg)   SpO2 98%   BMI 26.23 kg/m   General:  alert, cooperative, and no distress  Abdomen soft, incision well-healed.  Ileostomy in the right lower quadrant pink and patent.     Assessment:    Doing well postoperatively.    Plan:   I told the patient to continue Imodium as needed.  Call us if we can be of further assistance.  Reversal of his ileostomy is pending the outcome of his chemotherapy.  All questions answered.

## 2021-09-26 ENCOUNTER — Other Ambulatory Visit: Payer: Self-pay | Admitting: Oncology

## 2021-09-26 ENCOUNTER — Other Ambulatory Visit (HOSPITAL_COMMUNITY): Payer: Self-pay

## 2021-09-26 ENCOUNTER — Encounter: Payer: Self-pay | Admitting: Oncology

## 2021-09-26 DIAGNOSIS — C19 Malignant neoplasm of rectosigmoid junction: Secondary | ICD-10-CM

## 2021-09-26 MED ORDER — CAPECITABINE 500 MG PO TABS
1500.0000 mg | ORAL_TABLET | Freq: Two times a day (BID) | ORAL | 5 refills | Status: DC
  Filled 2021-09-26: qty 84, 21d supply, fill #0
  Filled 2021-10-17: qty 84, 21d supply, fill #1
  Filled 2021-11-01: qty 84, 21d supply, fill #2
  Filled 2021-11-21: qty 84, 21d supply, fill #3
  Filled 2022-01-22: qty 84, 21d supply, fill #4
  Filled 2022-02-26: qty 84, 21d supply, fill #5

## 2021-09-26 NOTE — Progress Notes (Signed)
Patient Care Team: [name], MD as PCP - General (Family Medicine) Alba Destine, MD as Rounding Team (Internal Medicine)  Clinic Day:  09/27/21  Referring physician: [name], MD  ASSESSMENT & PLAN:   Assessment & Plan: Cancer of rectosigmoid (colon) ([place]) This was a large tumor, 8.3 cm, grade 2 but with 35 negative nodes and clear margins.  He did have microperforation with extension through the wall into the pericolonic adipose tissue for a T4a N0 M0, stage IIB.  Pathology showed this was arising within a tubular adenoma.  This was nearly obstructing.  He was started on capecitabine in mid May at 1500 mg twice daily for 14 days on and 7 days off every 21 days. He is tolerating treatment well with minimal side effects.   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   Ileostomy in place [facility]) He has high ileostomy output and required readmission to the hospital on 2 occasions since his surgery.   Iron deficiency anemia due to chronic blood loss He is on supplement with a prenatal vitamin.  He is tolerating this well and hemoglobin is stable at 10.0.  I think he would benefit from iron supplement but he doesn't think he can tolerate so we may need to consider IV iron.   He has completed his first cycle of capecitabine and will start his second cycle tomorrow.  He is tolerating this well.  He is iron deficient but hesitant to take oral supplement so may need IV.  I have prescribed Meloxicam 7.5 mg to take daily as needed for his severe debilitating arthritis. He does not plan to take daily but use sparingly and knows to take with food.  He will be due for labs and check in 3 weeks prior to his next cycle.  He is moving back to the [place] area and so I will refer him to our partners at [facility] at [place]. I will be glad to see him back as needed.  The patient  understands the plans discussed today and is in agreement with them.  He knows to contact our office if he develops concerns prior to his next appointment.    Derwood Kaplan, MD  [facility] [address] [place] [address] Dept: [phone] Dept Fax: [phone]   Orders Placed This Encounter  Procedures   CBC and differential    This external order was created through the Results Console.   CBC    This external order was created through the Results Console.   Basic metabolic panel    This external order was created through the Results Console.   Comprehensive metabolic panel    This external order was created through the Results Console.   Hepatic function panel    This external order was created through the Results Console.   Ambulatory referral to Hematology / Oncology    Referral Priority:   Routine    Referral Type:   Consultation    Referral Reason:   Specialty Services Required    Requested Specialty:   Oncology    Number of Visits Requested:   1      CHIEF COMPLAINT:  CC: A 66 year old male with history of colon cancer here for 4 week evaluation   Current Treatment:  Capecitabine  HISTORY OF PRESENT ILLNESS:  Todd Richardson is a 66 y.o. male with a history of iron deficiency anemia who is referred in consultation with Dr. Blake Divine for assessment and management of newly diagnosed bilateral adenocarcinoma of the colon. He tells me this started as diarrhea which was dark in color but he was not sure whether this was bleeding.  His family doctor told him he was anemic and colonoscopy on April 4 revealed an obstructing sigmoid colon carcinoma with a circumferential mass.  Pathology revealed this to be a moderately differentiated adenocarcinoma, arising within a tubular adenoma.  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  He was taken to surgery at  [facility] by Dr. Blake Divine on April 6 and had a subtotal colectomy with an end ileostomy.  Final pathology revealed the sigmoid adenocarcinoma to be 8.3 cm in diameter and extending through the wall into the pericolonic adipose tissue with microperforation.  The lesion of the cecum was 7.4 cm and both were grade 2 and had clear margins.  35 negative nodes were removed.  He was having high output from his ileostomy and was admitted to [facility] on April the [facility] for complications.  A CT scan revealed pneumoperitoneum and ascites with severe edema.  He was found to have hyponatremia, hypokalemia, and severe low protein state.  He seemed to improve but was readmitted to the hospital on April 20 with atrial fibrillation and placed on Cardizem.  He had had atrial fibrillation in the past and had been on Eliquis but this has been stopped due to his iron deficiency anemia and he is on aspirin.  He had severe edema but also had a very low protein state and so it has taken some time to improve.  He had been very weak and his hemoglobin at the time of discharge on April 23 was 8.2.  He was given transfusion of 2 units of packed cells during his surgery in [place] and 1 unit at [place] is here today for repeat clinical assessment.  He tolerated his first cycle of capecitabine well and is due to start his second 1 tomorrow.  He had some fatigue and dry mouth but denies any nausea or vomiting.  His hemoglobin is stable at 10.0 but has not improved any.  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  He requires canes to ambulate.  The rest of his CBC and CMP are normal.  His postop CEA was 1.6.  He denies fevers or chills. He denies pain. His appetite is good. His weight has increased 4 pounds over last 4 weeks .  I have reviewed the past medical history, past surgical history, social history and family history with the patient and they are  unchanged from previous note.  ALLERGIES:  is allergic to apple and apple juice.  MEDICATIONS:  Current Outpatient Medications  Medication Sig Dispense Refill   meloxicam (MOBIC) 7.5 MG tablet Take 1 tablet (7.5 mg total) by mouth daily. 30 tablet 1   capecitabine (XELODA) 500 MG tablet Take 3 tablets (1,500 mg total) by mouth 2 (two) times daily after a meal. Take for 14 days on, 7 days off. Repeat every 21 days. 84 tablet 5   cetirizine (ZYRTEC) 10 MG tablet Take 10 mg by mouth as needed for allergies.     pantoprazole (PROTONIX) 40 MG tablet Take 1 tablet (40 mg total) by mouth daily. (Patient  not taking: Reported on 09/05/2021) 30 tablet 1   psyllium (HYDROCIL/METAMUCIL) 95 % PACK Take 1 packet by mouth daily. 240 each 2   tamsulosin (FLOMAX) 0.4 MG CAPS capsule Take 1 capsule (0.4 mg total) by mouth 2 (two) times daily. 60 capsule 1   No current facility-administered medications for this visit.    HISTORY OF PRESENT ILLNESS:   Oncology History  Cecal cancer ([place])  07/31/2021 Cancer Staging   Staging form: Colon and Rectum, AJCC 8th Edition - Clinical stage from 07/31/2021: Stage IIA (cT3(2), cN0, cM0) - Signed by Derwood Kaplan, MD on 09/01/2021 Histopathologic type: Adenocarcinoma, NOS Stage prefix: Initial diagnosis Total positive nodes: 0 Total nodes examined: 35 Histologic grade (G): G2 Histologic grading system: 4 grade system Laterality: Right Bilateral cancer: Yes Tumor size (mm): 74 Multiple tumors: Yes Number of tumors: 2 Lymph-vascular invasion (LVI): LVI not present (absent)/not identified Diagnostic confirmation: Positive histology Specimen type: Excision Staged by: Managing physician Tumor deposits (TD): Absent Perineural invasion (PNI): Absent Microsatellite instability (MSI): Stable KRAS mutation: Not assessed NRAS mutation: Not assessed BRAF mutation: Not assessed Stage used in treatment planning: Yes National guidelines used in treatment planning:  Yes Type of national guideline used in treatment planning: NCCN   08/28/2021 Initial Diagnosis   Cecal cancer ([place])   Cancer of rectosigmoid (colon) ([place])  07/31/2021 Initial Diagnosis   Cancer of rectosigmoid (colon) ([place])   07/31/2021 Cancer Staging   Staging form: Colon and Rectum, AJCC 8th Edition - Clinical stage from 07/31/2021: Stage IIB (cT4a(2), cN0, cM0) - Signed by Derwood Kaplan, MD on 09/01/2021 Histopathologic type: Adenocarcinoma, NOS Stage prefix: Initial diagnosis Total positive nodes: 0 Total nodes examined: 35 Histologic grade (G): G2 Histologic grading system: 4 grade system Laterality: Left Bilateral cancer: Yes Tumor size (mm): 83 Multiple tumors: Yes Number of tumors: 2 Lymph-vascular invasion (LVI): LVI not present (absent)/not identified Diagnostic confirmation: Positive histology Specimen type: Excision Staged by: Managing physician Tumor deposits (TD): Absent Carcinoembryonic antigen (CEA) (ng/mL): 1.6 Perineural invasion (PNI): Absent Microsatellite instability (MSI): Stable KRAS mutation: Not assessed NRAS mutation: Not assessed BRAF mutation: Not assessed Stage used in treatment planning: Yes National guidelines used in treatment planning: Yes Type of national guideline used in treatment planning: NCCN   09/12/2021 - 09/12/2021 Chemotherapy   Patient is on Treatment Plan : COLORECTAL Capecitabine q21d         REVIEW OF SYSTEMS:   Constitutional: Denies fevers, chills or abnormal weight loss Eyes: Denies blurriness of vision Ears, nose, mouth, throat, and face: Denies mucositis or sore throat Respiratory: Denies cough, dyspnea or wheezes Cardiovascular: Denies palpitation, chest discomfort or lower extremity swelling Gastrointestinal:  Denies nausea, heartburn or change in bowel habits Skin: Denies abnormal skin rashes Lymphatics: Denies new lymphadenopathy or easy bruising Neurological:Denies numbness, tingling or new  weaknesses Behavioral/Psych: Mood is stable, no new changes  All other systems were reviewed with the patient and are negative.   VITALS:  Blood pressure 108/72, pulse 91, temperature 98.1 F (36.7 C), temperature source Oral, resp. rate 18, height 5' 6.9" (1.699 m), weight 170 lb (77.1 kg), SpO2 97 %.  Wt Readings from Last 3 Encounters:  09/27/21 170 lb (77.1 kg)  09/25/21 167 lb (75.8 kg)  09/19/21 166 lb 14.4 oz (75.7 kg)    Body mass index is 26.71 kg/m.  Performance status (ECOG): 1 - Symptomatic but completely ambulatory  PHYSICAL EXAM:   GENERAL:alert, no distress and comfortable SKIN: skin color, texture, turgor are normal, no  rashes or significant lesions EYES: normal, Conjunctiva are pink and non-injected, sclera clear OROPHARYNX:no exudate, no erythema and lips, buccal mucosa, and tongue normal  NECK: supple, thyroid normal size, non-tender, without nodularity LYMPH:  no palpable lymphadenopathy in the cervical, axillary or inguinal LUNGS: clear to auscultation and percussion with normal breathing effort HEART: regular rate & rhythm and no murmurs and no lower extremity edema ABDOMEN:abdomen soft, non-tender and normal bowel sounds Musculoskeletal:no cyanosis of digits and no clubbing  NEURO: alert & oriented x 3 with fluent speech, no focal motor/sensory deficits  LABORATORY DATA:  I have reviewed the data as listed    Component Value Date/Time   NA 137 09/27/2021 0000   K 4.4 09/27/2021 0000   CL 99 09/27/2021 0000   CO2 27 (A) 09/27/2021 0000   GLUCOSE 131 (H) 08/09/2021 0643   BUN 14 09/27/2021 0000   CREATININE 0.8 09/27/2021 0000   CREATININE 0.81 08/09/2021 0643   CALCIUM 9.2 09/27/2021 0000   PROT 6.2 (A) 08/31/2021 0000   ALBUMIN 4.0 09/27/2021 0000   AST 25 09/27/2021 0000   ALT 18 09/27/2021 0000   ALKPHOS 65 09/27/2021 0000   BILITOT 0.5 07/31/2021 1547   GFRNONAA >60 08/09/2021 0643   GFRAA >60 04/10/2018 1234    No results found for:  "SPEP", "UPEP"  Lab Results  Component Value Date   WBC 6.0 09/27/2021   NEUTROABS 3.66 09/27/2021   HGB 10.0 (A) 09/27/2021   HCT 31 (A) 09/27/2021   MCV 94 08/31/2021   PLT 347 09/27/2021      Chemistry      Component Value Date/Time   NA 137 09/27/2021 0000   K 4.4 09/27/2021 0000   CL 99 09/27/2021 0000   CO2 27 (A) 09/27/2021 0000   BUN 14 09/27/2021 0000   CREATININE 0.8 09/27/2021 0000   CREATININE 0.81 08/09/2021 0643   GLU 100 09/27/2021 0000      Component Value Date/Time   CALCIUM 9.2 09/27/2021 0000   ALKPHOS 65 09/27/2021 0000   AST 25 09/27/2021 0000   ALT 18 09/27/2021 0000   BILITOT 0.5 07/31/2021 1547       RADIOGRAPHIC STUDIES: I have personally reviewed the radiological images as listed and agreed with the findings in the report. No results found.

## 2021-09-27 ENCOUNTER — Telehealth: Payer: Self-pay

## 2021-09-27 ENCOUNTER — Inpatient Hospital Stay: Payer: Medicare Other | Attending: Oncology | Admitting: Oncology

## 2021-09-27 ENCOUNTER — Inpatient Hospital Stay: Payer: Medicare Other

## 2021-09-27 ENCOUNTER — Other Ambulatory Visit: Payer: Self-pay | Admitting: Oncology

## 2021-09-27 ENCOUNTER — Encounter: Payer: Self-pay | Admitting: Oncology

## 2021-09-27 VITALS — BP 108/72 | HR 91 | Temp 98.1°F | Resp 18 | Ht 66.9 in | Wt 170.0 lb

## 2021-09-27 DIAGNOSIS — C180 Malignant neoplasm of cecum: Secondary | ICD-10-CM

## 2021-09-27 DIAGNOSIS — D500 Iron deficiency anemia secondary to blood loss (chronic): Secondary | ICD-10-CM | POA: Diagnosis not present

## 2021-09-27 DIAGNOSIS — M170 Bilateral primary osteoarthritis of knee: Secondary | ICD-10-CM

## 2021-09-27 DIAGNOSIS — C19 Malignant neoplasm of rectosigmoid junction: Secondary | ICD-10-CM

## 2021-09-27 DIAGNOSIS — M1990 Unspecified osteoarthritis, unspecified site: Secondary | ICD-10-CM | POA: Insufficient documentation

## 2021-09-27 LAB — HEPATIC FUNCTION PANEL
ALT: 18 U/L (ref 10–40)
AST: 25 (ref 14–40)
Alkaline Phosphatase: 65 (ref 25–125)
Bilirubin, Total: 0.5

## 2021-09-27 LAB — COMPREHENSIVE METABOLIC PANEL
Albumin: 4 (ref 3.5–5.0)
Calcium: 9.2 (ref 8.7–10.7)

## 2021-09-27 LAB — BASIC METABOLIC PANEL
BUN: 14 (ref 4–21)
CO2: 27 — AB (ref 13–22)
Chloride: 99 (ref 99–108)
Creatinine: 0.8 (ref 0.6–1.3)
Glucose: 100
Potassium: 4.4 mEq/L (ref 3.5–5.1)
Sodium: 137 (ref 137–147)

## 2021-09-27 LAB — CBC AND DIFFERENTIAL
HCT: 31 — AB (ref 41–53)
Hemoglobin: 10 — AB (ref 13.5–17.5)
Neutrophils Absolute: 3.66
Platelets: 347 10*3/uL (ref 150–400)
WBC: 6

## 2021-09-27 LAB — CBC: RBC: 3.11 — AB (ref 3.87–5.11)

## 2021-09-27 MED ORDER — MELOXICAM 7.5 MG PO TABS: 7.5000 mg | ORAL_TABLET | Freq: Every day | ORAL | 1 refills | Status: DC

## 2021-09-27 NOTE — Telephone Encounter (Signed)
I spoke with Todd Richardson in Rm 4. He is here for f/u with Dr Hinton Rao. Todd Richardson is taking the Xeloda around 730-8 am, and then 7pm w/food. He denies missed doses. Todd Richardson admits to dry mouth, fatigue, and occasional heartburn. He denies N/V, mouth sores, diarrhea, hand-foot syndrome symptoms, and fevers. Appetite is good, he has gained 3# since last visit. Todd Richardson planning to move back to [place] very soon. Dr Hinton Rao plans to get him setup with her colleague @ [facility]

## 2021-09-27 NOTE — Telephone Encounter (Signed)
Electronically sent referral to [facility] for transfer of care.

## 2021-09-27 NOTE — Telephone Encounter (Signed)
-----   Message from Derwood Kaplan, MD sent at 09/27/2021 10:59 AM EDT ----- Regarding: refer Pls transfer his care to [facility], [facility], due appt/labs in 3 weeks

## 2021-09-28 ENCOUNTER — Ambulatory Visit: Payer: Medicare Other | Admitting: Cardiology

## 2021-09-28 ENCOUNTER — Encounter: Payer: Self-pay | Admitting: Oncology

## 2021-10-08 ENCOUNTER — Other Ambulatory Visit: Payer: Self-pay | Admitting: Oncology

## 2021-10-09 ENCOUNTER — Other Ambulatory Visit (HOSPITAL_COMMUNITY): Payer: Self-pay

## 2021-10-10 ENCOUNTER — Telehealth: Payer: Self-pay | Admitting: Dietician

## 2021-10-10 NOTE — Telephone Encounter (Signed)
Patient's follow up appointment was canceled because he is transferring care to [facility].  I tried to reach patient to let him know nutrition I can still follow remotely or ask RD who covers [facility] see in person if needed.  Call to home # went straight to voice mail and mailbox was full.  Please reach out if nutrition services are desired.  April Manson, RDN, LDN Registered Dietitian, [place] Part Time Remote (Usual office hours: Tuesday-Thursday) Remote Office: [phone]

## 2021-10-11 ENCOUNTER — Other Ambulatory Visit (HOSPITAL_COMMUNITY): Payer: Self-pay

## 2021-10-15 ENCOUNTER — Inpatient Hospital Stay (HOSPITAL_COMMUNITY): Payer: Medicare Other | Attending: Hematology | Admitting: Hematology

## 2021-10-15 ENCOUNTER — Inpatient Hospital Stay (HOSPITAL_COMMUNITY): Payer: Medicare Other | Admitting: Dietician

## 2021-10-16 ENCOUNTER — Encounter: Payer: Medicare Other | Admitting: Dietician

## 2021-10-17 ENCOUNTER — Other Ambulatory Visit: Payer: Medicare Other

## 2021-10-17 ENCOUNTER — Other Ambulatory Visit (HOSPITAL_COMMUNITY): Payer: Self-pay

## 2021-10-17 ENCOUNTER — Ambulatory Visit: Payer: Medicare Other | Admitting: Oncology

## 2021-10-24 ENCOUNTER — Telehealth: Payer: Self-pay

## 2021-10-24 NOTE — Telephone Encounter (Signed)
I spoke with pt. He has moved back to [place] and didn't have any problem getting Xeloda shipment. He continues to take the Xeloda 12 hours apart w/food. He denies missed doses. Pt denies N/V, mouth sores, diarrhea, hand-foot syndrome symptoms, and fevers. Appetite is good. He has appt set up with Dr Delton Coombes on 11/05/2021 to get established w/ location. Pt reminded to call us if he develops temp of 100.4 or higher, day or night. He verbalized understanding.

## 2021-11-01 ENCOUNTER — Other Ambulatory Visit (HOSPITAL_COMMUNITY): Payer: Self-pay

## 2021-11-02 ENCOUNTER — Encounter (HOSPITAL_COMMUNITY): Payer: Self-pay | Admitting: Nurse Practitioner

## 2021-11-05 ENCOUNTER — Inpatient Hospital Stay (HOSPITAL_COMMUNITY): Payer: Medicare Other

## 2021-11-05 ENCOUNTER — Other Ambulatory Visit (HOSPITAL_COMMUNITY): Payer: Self-pay

## 2021-11-05 ENCOUNTER — Inpatient Hospital Stay (HOSPITAL_COMMUNITY): Payer: Medicare Other | Attending: Hematology | Admitting: Hematology

## 2021-11-05 VITALS — BP 121/77 | HR 79 | Temp 98.2°F | Resp 18 | Ht 67.0 in | Wt 162.5 lb

## 2021-11-05 DIAGNOSIS — C19 Malignant neoplasm of rectosigmoid junction: Secondary | ICD-10-CM | POA: Insufficient documentation

## 2021-11-05 DIAGNOSIS — Z800 Family history of malignant neoplasm of digestive organs: Secondary | ICD-10-CM | POA: Diagnosis not present

## 2021-11-05 DIAGNOSIS — M1990 Unspecified osteoarthritis, unspecified site: Secondary | ICD-10-CM | POA: Insufficient documentation

## 2021-11-05 LAB — CBC WITH DIFFERENTIAL/PLATELET
Abs Immature Granulocytes: 0.01 10*3/uL (ref 0.00–0.07)
Basophils Absolute: 0 10*3/uL (ref 0.0–0.1)
Basophils Relative: 1 %
Eosinophils Absolute: 0.2 10*3/uL (ref 0.0–0.5)
Eosinophils Relative: 4 %
HCT: 30.1 % — ABNORMAL LOW (ref 39.0–52.0)
Hemoglobin: 10.3 g/dL — ABNORMAL LOW (ref 13.0–17.0)
Immature Granulocytes: 0 %
Lymphocytes Relative: 18 %
Lymphs Abs: 1.1 10*3/uL (ref 0.7–4.0)
MCH: 35.5 pg — ABNORMAL HIGH (ref 26.0–34.0)
MCHC: 34.2 g/dL (ref 30.0–36.0)
MCV: 103.8 fL — ABNORMAL HIGH (ref 80.0–100.0)
Monocytes Absolute: 0.5 10*3/uL (ref 0.1–1.0)
Monocytes Relative: 9 %
Neutro Abs: 4.3 10*3/uL (ref 1.7–7.7)
Neutrophils Relative %: 68 %
Platelets: 355 10*3/uL (ref 150–400)
RBC: 2.9 MIL/uL — ABNORMAL LOW (ref 4.22–5.81)
RDW: 18.6 % — ABNORMAL HIGH (ref 11.5–15.5)
WBC: 6.2 10*3/uL (ref 4.0–10.5)
nRBC: 0 % (ref 0.0–0.2)

## 2021-11-05 LAB — COMPREHENSIVE METABOLIC PANEL
ALT: 17 U/L (ref 0–44)
AST: 23 U/L (ref 15–41)
Albumin: 4.2 g/dL (ref 3.5–5.0)
Alkaline Phosphatase: 58 U/L (ref 38–126)
Anion gap: 8 (ref 5–15)
BUN: 20 mg/dL (ref 8–23)
CO2: 25 mmol/L (ref 22–32)
Calcium: 9.6 mg/dL (ref 8.9–10.3)
Chloride: 102 mmol/L (ref 98–111)
Creatinine, Ser: 1.19 mg/dL (ref 0.61–1.24)
GFR, Estimated: >60 mL/min (ref >60)
Glucose, Bld: 86 mg/dL (ref 70–99)
Potassium: 3.7 mmol/L (ref 3.5–5.1)
Sodium: 135 mmol/L (ref 135–145)
Total Bilirubin: 1 mg/dL (ref 0.3–1.2)
Total Protein: 7.5 g/dL (ref 6.5–8.1)

## 2021-11-05 LAB — MAGNESIUM: Magnesium: 2.2 mg/dL (ref 1.7–2.4)

## 2021-11-05 NOTE — Patient Instructions (Addendum)
[place] at [facility] Discharge Instructions  You were seen and examined today by Dr. Delton Coombes. Dr. Delton Coombes is a medical oncologist, meaning that he specializes in the treatment of cancer diagnoses. Dr. Delton Coombes discussed your past medical history, family history of cancers, and the events that led to you being here today.  You were referred to Dr. Delton Coombes from the [facility] for ongoing management of your colon cancer.  Dr. Delton Coombes has recommended labs today, just to see how your labs are doing now.  Continue taking Xeloda as prescribed.  While you are taking Xeloda, keep your hands and feet moisturized during therapy.  Follow-up as scheduled in about 3 weeks.  Thank you for choosing [place] at [facility] to provide your oncology and hematology care.  To afford each patient quality time with our provider, please arrive at least 15 minutes before your scheduled appointment time.   If you have a lab appointment with the [facility] please come in thru the Main Entrance and check in at the main information desk.  You need to re-schedule your appointment should you arrive 10 or more minutes late.  We strive to give you quality time with our providers, and arriving late affects you and other patients whose appointments are after yours.  Also, if you no show three or more times for appointments you may be dismissed from the clinic at the providers discretion.     Again, thank you for choosing [facility].  Our hope is that these requests will decrease the amount of time that you wait before being seen by our physicians.       _____________________________________________________________  Should you have questions after your visit to [facility], please contact our office at [phone] and follow the prompts.  Our office hours are 8:00 a.m. and 4:30 p.m. Monday - Friday.  Please note that  voicemails left after 4:00 p.m. may not be returned until the following business day.  We are closed weekends and major holidays.  You do have access to a nurse 24-7, just call the main number to the clinic [phone] and do not press any options, hold on the line and a nurse will answer the phone.    For prescription refill requests, have your pharmacy contact our office and allow 72 hours.

## 2021-11-05 NOTE — Progress Notes (Signed)
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 66 y.o. 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  Today he reports feeling good. He reports he started Xeloda on 5/10. He is currently on his week off of Xeloda. He reports fatigue in the evenings. He also reports itching bumps on his arms, and skin peeling on his hands during his second week on Xeloda. He empties his ileostomy bag 4-6 times daily which has not changed since starting Xeloda, and he reports occasional diarrhea occurring 1-2 times weekly. He denies nausea, vomiting, mouth sores, and blistering. He reports dry mouth. He reports he has been eating well. He denies history of colonoscopies. He reports a history of arthritis affecting his right hip and right knee due to which he walks with a cane. He reports he stubbed his left great toe, a "blood blister" appeared and he popped it. He reports he was then prescribed Keflex today. He reports he drinks 6 bottle of water daily with 1 cup of coffee and 1 beer in the evening.  He lives at home on his own. His father (in his 67's) and his maternal uncle had colon cancer. 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 He denies smoking history and asbestos exposure.   MEDICAL HISTORY:  Past Medical History:  Diagnosis Date   Arthritis    Asthma    Vertigo     SURGICAL HISTORY: Past Surgical History:  Procedure Laterality Date   BIOPSY  07/31/2021    Procedure: BIOPSY;  Surgeon: Eloise Harman, DO;  Location: [facility];  Service: Endoscopy;;   CATARACT EXTRACTION Left    COLON RESECTION N/A 08/02/2021   Procedure: En bloc Resection of Colon and Small bowel;  Surgeon: Virl Cagey, MD;  Location: [facility] ORS;  Service: General;  Laterality: N/A;   COLOSTOMY Right 08/02/2021   Procedure: ILEOSTOMY;  Surgeon: Virl Cagey, MD;  Location: [facility] OR[facility];  Service: General;  Laterality: Right;   FLEXIBLE SIGMOIDOSCOPY  07/31/2021   Procedure: FLEXIBLE SIGMOIDOSCOPY;  Surgeon: Eloise Harman, DO;  Location: [facility];  Service: Endoscopy;;   SUBMUCOSAL TATTOO INJECTION  07/31/2021   Procedure: SUBMUCOSAL TATTOO INJECTION;  Surgeon: Eloise Harman, DO;  Location: [facility];  Service: Endoscopy;;   SUBTOTAL COLECTOMY     end ileostomy; sigmoid and cecal cancers    SOCIAL HISTORY: Social History   Socioeconomic History   Marital status: Single    Spouse name: Not on file   Number of children: 0   Years of education: 12+1   Highest education level: Some college, no degree  Occupational History   Not on file  Tobacco Use   Smoking status: Never   Smokeless tobacco: Never  Vaping Use   Vaping Use: Never used  Substance and Sexual Activity   Alcohol use: Not Currently    Alcohol/week: 4.0 standard drinks of alcohol    Types: 4 Cans of beer per week    Comment: every day-none in 6 months or  more   Drug use: Never   Sexual activity: Not Currently  Other Topics Concern   Not on file  Social History Narrative   Not on file   Social Determinants of Health   Financial Resource Strain: Not on file  Food Insecurity: Not on file  Transportation Needs: Not on file  Physical Activity: Not on file  Stress: No Stress Concern Present (08/31/2021)   [place]    Feeling of Stress : Not at all  Social Connections: Not on file  Intimate Partner Violence: Not At  Risk (08/31/2021)   Humiliation, Afraid, Rape, and Kick questionnaire    Fear of Current or Ex-Partner: No    Emotionally Abused: No    Physically Abused: No    Sexually Abused: No    FAMILY HISTORY: Family History  Problem Relation Age of Onset   Hypertension Mother    Diabetes Mother    Parkinson's disease Father    Dementia Father     ALLERGIES:  is allergic to apple and apple juice.  MEDICATIONS:  Current Outpatient Medications  Medication Sig Dispense Refill   capecitabine (XELODA) 500 MG tablet Take 3 tablets (1,500 mg total) by mouth 2 (two) times daily after a meal. Take for 14 days on, 7 days off. Repeat every 21 days. 84 tablet 5   cephALEXin (KEFLEX) 500 MG capsule 1 capsule     cetirizine (ZYRTEC) 10 MG tablet Take 10 mg by mouth as needed for allergies.     meloxicam (MOBIC) 7.5 MG tablet Take 1 tablet (7.5 mg total) by mouth daily. 30 tablet 1   psyllium (HYDROCIL/METAMUCIL) 95 % PACK Take 1 packet by mouth daily. 240 each 2   No current facility-administered medications for this visit.    REVIEW OF SYSTEMS:   Review of Systems  Constitutional:  Negative for appetite change and fatigue.  HENT:   Positive for trouble swallowing. Negative for mouth sores.        Dry mouth  Gastrointestinal:  Positive for diarrhea (mild). Negative for nausea and vomiting.  Skin:  Positive for rash (arms).       Skin peeling  All other systems reviewed and are negative.    PHYSICAL EXAMINATION: ECOG PERFORMANCE STATUS: 1 - Symptomatic but completely ambulatory  Vitals:   11/05/21 1339  BP: 121/77  Pulse: 79  Resp: 18  Temp: 98.2 F (36.8 C)  SpO2: 98%   Filed Weights   11/05/21 1339  Weight: 162 lb 8 oz (73.7 kg)   Physical Exam Vitals reviewed.  Constitutional:      Appearance: Normal appearance.  Cardiovascular:     Rate and Rhythm: Normal rate and regular rhythm.     Pulses: Normal pulses.     Heart sounds: Normal heart sounds.  Pulmonary:     Effort:  Pulmonary effort is normal.     Breath sounds: Normal breath sounds.  Abdominal:     Palpations: Abdomen is soft. There is no mass.     Tenderness: There is no abdominal tenderness.     Comments: RLQ ileostomy  Musculoskeletal:     Right lower leg: No edema.     Left lower leg: No edema.  Skin:    Findings: Erythema (lesions on forearms bilaterally) and rash (lesions on forearms bilaterally) present. Rash is macular and papular.     Comments: Skin peeling at fingertips  Neurological:     General: No focal deficit present.  Mental Status: He is alert and oriented to person, place, and time.  Psychiatric:        Mood and Affect: Mood normal.        Behavior: Behavior normal.      LABORATORY DATA:  I have reviewed the data as listed    Latest Ref Rng & Units 09/27/2021   12:00 AM 09/19/2021   12:00 AM 08/31/2021   12:00 AM  CBC  WBC  6.0     5.4     6.1      Hemoglobin 13.5 - 17.5 10.0     10.2     9.2      Hematocrit 41 - 53 31     31     28       Platelets 150 - 400 K/uL 347     384     397         This result is from an external source.      Latest Ref Rng & Units 09/27/2021   12:00 AM 09/19/2021   12:00 AM 08/31/2021   12:00 AM  CMP  BUN 4 - 21 14     13     6       Creatinine 0.6 - 1.3 0.8     0.9     0.7      Sodium 137 - 147 137     138     137      Potassium 3.5 - 5.1 mEq/L 4.4     3.9     4.0      Chloride 99 - 108 99     101     103      CO2 13 - 22 27     30     23       Calcium 8.7 - 10.7 9.2     9.3     10.0      Total Protein 6.3 - 8.2 g/dL   6.2      Alkaline Phos 25 - 125 65     64     70      AST 14 - 40 25     26     20       ALT 10 - 40 U/L 18     19     17          This result is from an external source.    RADIOGRAPHIC STUDIES: I have personally reviewed the radiological images as listed and agreed with the findings in the report. No results found.  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  Total examined lymph nodes 35.  MMR-preserved. - CT CAP on 07/31/2021 with no evidence of metastatic disease. - Xeloda 1500 mg twice daily 2 weeks on/1 week off in the adjuvant setting cycle 1 started on 09/05/2021   Social/family history: - He lives by himself.  He is a retired Clinical biochemist.  No exposure to asbestos.  Non-smoker. - Father had colon cancer in his 76s.  Maternal uncle had colon cancer.   PLAN:  T4N0 sigmoid colon cancer: - He has completed third cycle of Xeloda last week. - He feels tired in the evening.  He also reported skin peeling of the fingers.  He developed red bumps with itching  on the forearms, Gold[place] cream is helping. - He is emptying ostomy bag 4-6 times per day.  However he empties when it fills up 1/3-1/4 of the back.  Denies any nausea vomiting or mucositis. - Reviewed labs today which showed normal LFTs.  CBC shows hemoglobin is 10.3 and MCV is 103.8.  Platelet count and white count is normal. - Last CEA was 1.6 on 09/03/2021. - He will proceed with cycle 4 starting on 11/09/2021. - RTC on 11/30/2021 prior to start of cycle 5 with repeat labs.  2.  Grade 1 HFSR: - He has skin changes with peeling of skin at the fingertips without any pain.  No limitation of ADLs. - Recommend applying moisturizing lotion twice daily for the palms and soles.  Recommend wearing protective gloves when dishwashing and gardening.  Recommend against walking barefoot.  He will call us if there is any blisters developing.   All questions were answered. The patient knows to call the clinic with any problems, questions or concerns.   Derek Jack, MD, 11/05/21 2:00 PM  [facility] [phone]   I, Thana Ates, am acting as a scribe for Dr. Derek Jack.  I, Derek Jack MD, have reviewed the above  documentation for accuracy and completeness, and I agree with the above.

## 2021-11-06 LAB — CEA: CEA: 1.1 ng/mL (ref 0.0–4.7)

## 2021-11-07 ENCOUNTER — Encounter (HOSPITAL_COMMUNITY): Payer: Self-pay

## 2021-11-07 NOTE — Progress Notes (Signed)
I met with the patient during and following initial visit with Dr. Katragadda. I introduced myself and explained my role in the patient's care. I provided my contact information and encouraged the patient to call with questions or concerns. 

## 2021-11-09 ENCOUNTER — Encounter (HOSPITAL_COMMUNITY): Payer: Self-pay | Admitting: Nurse Practitioner

## 2021-11-12 ENCOUNTER — Ambulatory Visit: Payer: Medicare Other | Admitting: Cardiology

## 2021-11-15 ENCOUNTER — Other Ambulatory Visit: Payer: Self-pay | Admitting: Oncology

## 2021-11-15 DIAGNOSIS — M170 Bilateral primary osteoarthritis of knee: Secondary | ICD-10-CM

## 2021-11-15 NOTE — Telephone Encounter (Signed)
1 time only

## 2021-11-16 ENCOUNTER — Other Ambulatory Visit (HOSPITAL_COMMUNITY): Payer: Self-pay | Admitting: *Deleted

## 2021-11-16 DIAGNOSIS — C19 Malignant neoplasm of rectosigmoid junction: Secondary | ICD-10-CM

## 2021-11-20 ENCOUNTER — Encounter (HOSPITAL_COMMUNITY): Payer: Self-pay | Admitting: Nurse Practitioner

## 2021-11-21 ENCOUNTER — Other Ambulatory Visit (HOSPITAL_COMMUNITY): Payer: Self-pay

## 2021-11-26 ENCOUNTER — Other Ambulatory Visit (HOSPITAL_COMMUNITY): Payer: Self-pay

## 2021-11-26 ENCOUNTER — Inpatient Hospital Stay (HOSPITAL_COMMUNITY): Payer: Medicare Other | Admitting: Hematology

## 2021-11-26 ENCOUNTER — Inpatient Hospital Stay (HOSPITAL_COMMUNITY): Payer: Medicare Other

## 2021-11-26 VITALS — BP 121/78 | HR 82 | Temp 98.6°F | Resp 18 | Ht 67.0 in | Wt 155.0 lb

## 2021-11-26 DIAGNOSIS — C19 Malignant neoplasm of rectosigmoid junction: Secondary | ICD-10-CM

## 2021-11-26 LAB — CBC WITH DIFFERENTIAL/PLATELET
Abs Immature Granulocytes: 0.02 10*3/uL (ref 0.00–0.07)
Basophils Absolute: 0 10*3/uL (ref 0.0–0.1)
Basophils Relative: 1 %
Eosinophils Absolute: 0.3 10*3/uL (ref 0.0–0.5)
Eosinophils Relative: 4 %
HCT: 27.8 % — ABNORMAL LOW (ref 39.0–52.0)
Hemoglobin: 10 g/dL — ABNORMAL LOW (ref 13.0–17.0)
Immature Granulocytes: 0 %
Lymphocytes Relative: 15 %
Lymphs Abs: 1.1 10*3/uL (ref 0.7–4.0)
MCH: 36.2 pg — ABNORMAL HIGH (ref 26.0–34.0)
MCHC: 36 g/dL (ref 30.0–36.0)
MCV: 100.7 fL — ABNORMAL HIGH (ref 80.0–100.0)
Monocytes Absolute: 0.6 10*3/uL (ref 0.1–1.0)
Monocytes Relative: 8 %
Neutro Abs: 5.2 10*3/uL (ref 1.7–7.7)
Neutrophils Relative %: 72 %
Platelets: 318 10*3/uL (ref 150–400)
RBC: 2.76 MIL/uL — ABNORMAL LOW (ref 4.22–5.81)
RDW: 17.4 % — ABNORMAL HIGH (ref 11.5–15.5)
WBC: 7.3 10*3/uL (ref 4.0–10.5)
nRBC: 0 % (ref 0.0–0.2)

## 2021-11-26 LAB — COMPREHENSIVE METABOLIC PANEL
ALT: 20 U/L (ref 0–44)
AST: 26 U/L (ref 15–41)
Albumin: 4.2 g/dL (ref 3.5–5.0)
Alkaline Phosphatase: 72 U/L (ref 38–126)
Anion gap: 9 (ref 5–15)
BUN: 31 mg/dL — ABNORMAL HIGH (ref 8–23)
CO2: 23 mmol/L (ref 22–32)
Calcium: 9.4 mg/dL (ref 8.9–10.3)
Chloride: 99 mmol/L (ref 98–111)
Creatinine, Ser: 1.73 mg/dL — ABNORMAL HIGH (ref 0.61–1.24)
GFR, Estimated: 43 mL/min — ABNORMAL LOW (ref >60)
Glucose, Bld: 105 mg/dL — ABNORMAL HIGH (ref 70–99)
Potassium: 4 mmol/L (ref 3.5–5.1)
Sodium: 131 mmol/L — ABNORMAL LOW (ref 135–145)
Total Bilirubin: 1.4 mg/dL — ABNORMAL HIGH (ref 0.3–1.2)
Total Protein: 7.4 g/dL (ref 6.5–8.1)

## 2021-11-26 LAB — MAGNESIUM: Magnesium: 2.1 mg/dL (ref 1.7–2.4)

## 2021-11-26 MED ORDER — PROCHLORPERAZINE MALEATE 10 MG PO TABS: 10.0000 mg | ORAL_TABLET | Freq: Four times a day (QID) | ORAL | 4 refills | Status: DC | PRN

## 2021-11-26 MED ORDER — CLOBETASOL PROP EMOLLIENT BASE 0.05 % EX CREA: TOPICAL_CREAM | CUTANEOUS | 3 refills | Status: DC

## 2021-11-26 NOTE — Progress Notes (Cosign Needed Addendum)
[place], [place] [address]   CLINIC:  Medical Oncology/Hematology  PCP:  [name], MD [address] [place] [address] [phone]   REASON FOR VISIT:  Follow-up for rectosigmoid cancer  PRIOR THERAPY: none  NGS Results: not done  CURRENT THERAPY: Xeloda  BRIEF ONCOLOGIC HISTORY:  Oncology History  Cecal cancer ([place])  07/31/2021 Cancer Staging   Staging form: Colon and Rectum, AJCC 8th Edition - Clinical stage from 07/31/2021: Stage IIA (cT3(2), cN0, cM0) - Signed by Derwood Kaplan, MD on 09/01/2021 Histopathologic type: Adenocarcinoma, NOS Stage prefix: Initial diagnosis Total positive nodes: 0 Total nodes examined: 35 Histologic grade (G): G2 Histologic grading system: 4 grade system Laterality: Right Bilateral cancer: Yes Tumor size (mm): 74 Multiple tumors: Yes Number of tumors: 2 Lymph-vascular invasion (LVI): LVI not present (absent)/not identified Diagnostic confirmation: Positive histology Specimen type: Excision Staged by: Managing physician Tumor deposits (TD): Absent Perineural invasion (PNI): Absent Microsatellite instability (MSI): Stable KRAS mutation: Not assessed NRAS mutation: Not assessed BRAF mutation: Not assessed Stage used in treatment planning: Yes National guidelines used in treatment planning: Yes Type of national guideline used in treatment planning: NCCN   08/28/2021 Initial Diagnosis   Cecal cancer ([place])   Cancer of rectosigmoid (colon) ([place])  07/31/2021 Initial Diagnosis   Cancer of rectosigmoid (colon) ([place])   07/31/2021 Cancer Staging   Staging form: Colon and Rectum, AJCC 8th Edition - Clinical stage from 07/31/2021: Stage IIB (cT4a(2), cN0, cM0) - Signed by Der[place] Kaplan, MD on 09/01/2021 Histopathologic type: Adenocarcinoma, NOS Stage prefix: Initial diagnosis Total positive nodes: 0 Total nodes examined: 35 Histologic grade (G): G2 Histologic grading system: 4  grade system Laterality: Left Bilateral cancer: Yes Tumor size (mm): 83 Multiple tumors: Yes Number of tumors: 2 Lymph-vascular invasion (LVI): LVI not present (absent)/not identified Diagnostic confirmation: Positive histology Specimen type: Excision Staged by: Managing physician Tumor deposits (TD): Absent Carcinoembryonic antigen (CEA) (ng/mL): 1.6 Perineural invasion (PNI): Absent Microsatellite instability (MSI): Stable KRAS mutation: Not assessed NRAS mutation: Not assessed BRAF mutation: Not assessed Stage used in treatment planning: Yes National guidelines used in treatment planning: Yes Type of national guideline used in treatment planning: NCCN   09/12/2021 - 09/12/2021 Chemotherapy   Patient is on Treatment Plan : COLORECTAL Capecitabine q21d       CANCER STAGING:  Cancer Staging  Cancer of rectosigmoid (colon) ([place]) Staging form: Colon and Rectum, AJCC 8th Edition - Clinical stage from 07/31/2021: Stage IIB (cT4a(2), cN0, cM0) - Signed by [place] Kaplan, MD on 09/01/2021  Cecal cancer [facility]) Staging form: Colon and Rectum, AJCC 8th Edition - Clinical stage from 07/31/2021: Stage IIA (cT3(2), cN0, cM0) - Signed by [place] Kaplan, MD on 09/01/2021   INTERVAL HISTORY:  [name], a 66 y.o. male, returns for routine follow-up of his rectosigmoid cancer. Moustapha was last seen on 11/05/2021.   Today he reports feeling good. He reports itching rash on his arms; he denies associated pain. He reports he wears long sleeve shirts when exposed to the sun. He reports he drinks four 16 ounce bottles of water daily. He reports blistering on his fingertips. He reports he is drinking 1 beer every other day. He reports nausea which has not been helped by Zofran. He reports sores on his lips.   REVIEW OF SYSTEMS:  Review of Systems  Constitutional:  Negative for appetite change and fatigue.  HENT:   Positive for mouth  sores (sores).   Gastrointestinal:  Positive for  abdominal pain (3/10) and nausea.  Skin:  Positive for itching (arms) and rash (arms).  Neurological:  Positive for dizziness.  All other systems reviewed and are negative.   PAST MEDICAL/SURGICAL HISTORY:  Past Medical History:  Diagnosis Date   Arthritis    Asthma    Vertigo    Past Surgical History:  Procedure Laterality Date   BIOPSY  07/31/2021   Procedure: BIOPSY;  Surgeon: Eloise Harman, DO;  [facility];  Service: Endoscopy;;   CATARACT EXTRACTION Left    COLON RESECTION N/A 08/02/2021   Procedure: En bloc Resection of Colon and Small bowel;  Surgeon: Virl Cagey, MD;  Location: [facility] ORS;  Service: General;  Laterality: N/A;   COLOSTOMY Right 08/02/2021   Procedure: ILEOSTOMY;  Surgeon: Virl Cagey, MD;  Location: [facility] ORS;  Service: General;  Laterality: Right;   FLEXIBLE SIGMOIDOSCOPY  07/31/2021   Procedure: FLEXIBLE SIGMOIDOSCOPY;  Surgeon: Eloise Harman, DO;  Location: [facility];  Service: Endoscopy;;   SUBMUCOSAL TATTOO INJECTION  07/31/2021   Procedure: SUBMUCOSAL TATTOO INJECTION;  Surgeon: Eloise Harman, DO;  [facility];  Service: Endoscopy;;   SUBTOTAL COLECTOMY     end ileostomy; sigmoid and cecal cancers    SOCIAL HISTORY:  Social History   Socioeconomic History   Marital status: Single    Spouse name: Not on file   Number of children: 0   Years of education: 12+1   Highest education level: Some college, no degree  Occupational History   Not on file  Tobacco Use   Smoking status: Never   Smokeless tobacco: Never  Vaping Use   Vaping Use: Never used  Substance and Sexual Activity   Alcohol use: Not Currently    Alcohol/week: 4.0 standard drinks of alcohol    Types: 4 Cans of beer per week    Comment: every day-none in 6 months or more   Drug use: Never   Sexual activity: Not Currently  Other Topics Concern   Not on file  Social History Narrative   Not on file   Social Determinants of Health    Financial Resource Strain: Not on file  Food Insecurity: Not on file  Transportation Needs: Not on file  Physical Activity: Not on file  Stress: No Stress Concern Present (08/31/2021)   [place]    Feeling of Stress : Not at all  Social Connections: Not on file  Intimate Partner Violence: Not At Risk (08/31/2021)   Humiliation, Afraid, Rape, and Kick questionnaire    Fear of Current or Ex-Partner: No    Emotionally Abused: No    Physically Abused: No    Sexually Abused: No    FAMILY HISTORY:  Family History  Problem Relation Age of Onset   Hypertension Mother    Diabetes Mother    Parkinson's disease Father    Dementia Father     CURRENT MEDICATIONS:  Current Outpatient Medications  Medication Sig Dispense Refill   capecitabine (XELODA) 500 MG tablet Take 3 tablets (1,500 mg total) by mouth 2 (two) times daily after a meal. Take for 14 days on, 7 days off. Repeat every 21 days. 84 tablet 5   cephALEXin (KEFLEX) 500 MG capsule 1 capsule     cetirizine (ZYRTEC) 10 MG tablet Take 10 mg by mouth as needed for allergies.     meloxicam (MOBIC) 7.5  MG tablet Take 1 tablet by mouth once daily 30 tablet 0   psyllium (HYDROCIL/METAMUCIL) 95 % PACK Take 1 packet by mouth daily. 240 each 2   silver sulfADIAZINE (SILVADENE) 1 % cream 1 application     No current facility-administered medications for this visit.    ALLERGIES:  Allergies  Allergen Reactions   Apple Anaphylaxis    Other reaction(s): Unknown   Apple Juice Anaphylaxis    Throat swelling     PHYSICAL EXAM:  Performance status (ECOG): 1 - Symptomatic but completely ambulatory  Vitals:   11/26/21 1504  BP: 121/78  Pulse: 82  Resp: 18  Temp: 98.6 F (37 C)  SpO2: 100%   Wt Readings from Last 3 Encounters:  11/26/21 155 lb (70.3 kg)  11/05/21 162 lb 8 oz (73.7 kg)  09/27/21 170 lb (77.1 kg)   Physical Exam Vitals reviewed.  Constitutional:       Appearance: Normal appearance.  Cardiovascular:     Rate and Rhythm: Normal rate and regular rhythm.     Pulses: Normal pulses.     Heart sounds: Normal heart sounds.  Pulmonary:     Effort: Pulmonary effort is normal.     Breath sounds: Normal breath sounds.  Skin:    Comments: Ulceration in crease of proximal interphalangeal joints  Neurological:     General: No focal deficit present.     Mental Status: He is alert and oriented to person, place, and time.  Psychiatric:        Mood and Affect: Mood normal.        Behavior: Behavior normal.      LABORATORY DATA:  I have reviewed the labs as listed.     Latest Ref Rng & Units 11/26/2021    1:46 PM 11/05/2021    2:05 PM 09/27/2021   12:00 AM  CBC  WBC 4.0 - 10.5 K/uL 7.3  6.2  6.0      Hemoglobin 13.0 - 17.0 g/dL 10.0  10.3  10.0      Hematocrit 39.0 - 52.0 % 27.8  30.1  31      Platelets 150 - 400 K/uL 318  355  347         This result is from an external source.      Latest Ref Rng & Units 11/26/2021    1:46 PM 11/05/2021    2:05 PM 09/27/2021   12:00 AM  CMP  Glucose 70 - 99 mg/dL 105  86    BUN 8 - 23 mg/dL 31  20  14       Creatinine 0.61 - 1.24 mg/dL 1.73  1.19  0.8      Sodium 135 - 145 mmol/L 131  135  137      Potassium 3.5 - 5.1 mmol/L 4.0  3.7  4.4      Chloride 98 - 111 mmol/L 99  102  99      CO2 22 - 32 mmol/L 23  25  27       Calcium 8.9 - 10.3 mg/dL 9.4  9.6  9.2      Total Protein 6.5 - 8.1 g/dL 7.4  7.5    Total Bilirubin 0.3 - 1.2 mg/dL 1.4  1.0    Alkaline Phos 38 - 126 U/L 72  58  65      AST 15 - 41 U/L 26  23  25       ALT 0 - 44 U/L 20  17  18         This result is from an external source.    DIAGNOSTIC IMAGING:  I have independently reviewed the scans and discussed with the patient. No results found.   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  Total examined lymph nodes 35.  MMR-preserved. - CT CAP on 07/31/2021 with no evidence of metastatic disease. - Xeloda 1500 mg twice daily 2 weeks on/1 week off in the adjuvant setting cycle 1 started on 09/05/2021    Social/family history: - He lives by himself.  He is a retired Clinical biochemist.  No exposure to asbestos.  Non-smoker. - Father had colon cancer in his 1s.  Maternal uncle had colon cancer.   PLAN:  T4N0 sigmoid colon cancer: - He started cycle 4 on 11/09/2021 through 11/22/2021. - He has developed more erythema on both forearms which is intensely itching. - I have recommended Clobetasone dipropionate 0.05% cream to be applied twice daily.  He is already using moisturizing lotion which is not helping. - Reviewed labs today which showed creatinine increased to 1.73, likely from Xeloda.  CBC shows microcytic anemia.  Last CEA was 1.1. - Hold Xeloda which she is due to start this Friday.  I will reevaluate him in 2 weeks.  Plan to cut back on the dose.  2.  Grade 2 HFSR: - He has ulceration in the creases of first interphalangeal joints.  He has skin peeling of in the palms.  He does not report any pain.  But it is affecting his functionality as he cannot open bottle caps. - I would recommend holding next cycle of Xeloda until skin lesions resolved. - I will reevaluate him in 2 weeks.  I plan to cut back on the dose of Xeloda.  3.  Nausea: - He is reportedly taking Zofran 4 mg which is not helping. - We will prescribe Compazine 10 mg every 6 hours as needed.   Orders placed this encounter:  No orders of the defined types were placed in this encounter.    Derek Jack, MD [place] [phone]   I, Thana Ates, am acting as a scribe for Dr. Derek Jack.  I, Derek Jack MD, have reviewed the above documentation for accuracy and completeness, and I agree with the above.

## 2021-11-26 NOTE — Patient Instructions (Signed)
[place] at [facility] Discharge Instructions  You were seen and examined today by Dr. Delton Coombes.  Dr. Delton Coombes discussed your most recent lab work and your kidney function creatinine is elevated.   Hold the Xeloda and do not take it until after you see Dr. Delton Coombes in 2 weeks.  Follow-up as scheduled.    Thank you for choosing [place] at [facility] to provide your oncology and hematology care.  To afford each patient quality time with our provider, please arrive at least 15 minutes before your scheduled appointment time.   If you have a lab appointment with the [place] please come in thru the Main Entrance and check in at the main information desk.  You need to re-schedule your appointment should you arrive 10 or more minutes late.  We strive to give you quality time with our providers, and arriving late affects you and other patients whose appointments are after yours.  Also, if you no show three or more times for appointments you may be dismissed from the clinic at the providers discretion.     Again, thank you for choosing [facility].  Our hope is that these requests will decrease the amount of time that you wait before being seen by our physicians.       _____________________________________________________________  Should you have questions after your visit to [facility], please contact our office at [phone] and follow the prompts.  Our office hours are 8:00 a.m. and 4:30 p.m. Monday - Friday.  Please note that voicemails left after 4:00 p.m. may not be returned until the following business day.  We are closed weekends and major holidays.  You do have access to a nurse 24-7, just call the main number to the clinic [phone] and do not press any options, hold on the line and a nurse will answer the phone.    For prescription refill requests, have your pharmacy contact our office and allow  72 hours.    Due to Covid, you will need to wear a mask upon entering the hospital. If you do not have a mask, a mask will be given to you at the Main Entrance upon arrival. For doctor visits, patients may have 1 support person age 40 or older with them. For treatment visits, patients can not have anyone with them due to social distancing guidelines and our immunocompromised population.

## 2021-12-12 ENCOUNTER — Inpatient Hospital Stay (HOSPITAL_BASED_OUTPATIENT_CLINIC_OR_DEPARTMENT_OTHER): Payer: Medicare Other | Admitting: Hematology

## 2021-12-12 ENCOUNTER — Other Ambulatory Visit: Payer: Self-pay | Admitting: Oncology

## 2021-12-12 ENCOUNTER — Inpatient Hospital Stay: Payer: Medicare Other | Admitting: Hematology

## 2021-12-12 ENCOUNTER — Inpatient Hospital Stay: Payer: Medicare Other | Attending: Oncology

## 2021-12-12 ENCOUNTER — Encounter: Payer: Self-pay | Admitting: Hematology

## 2021-12-12 DIAGNOSIS — C19 Malignant neoplasm of rectosigmoid junction: Secondary | ICD-10-CM | POA: Diagnosis not present

## 2021-12-12 DIAGNOSIS — R110 Nausea: Secondary | ICD-10-CM | POA: Insufficient documentation

## 2021-12-12 DIAGNOSIS — C187 Malignant neoplasm of sigmoid colon: Secondary | ICD-10-CM | POA: Insufficient documentation

## 2021-12-12 DIAGNOSIS — M170 Bilateral primary osteoarthritis of knee: Secondary | ICD-10-CM

## 2021-12-12 LAB — CBC WITH DIFFERENTIAL/PLATELET
Abs Immature Granulocytes: 0.02 10*3/uL (ref 0.00–0.07)
Basophils Absolute: 0.1 10*3/uL (ref 0.0–0.1)
Basophils Relative: 1 %
Eosinophils Absolute: 0.3 10*3/uL (ref 0.0–0.5)
Eosinophils Relative: 6 %
HCT: 30.3 % — ABNORMAL LOW (ref 39.0–52.0)
Hemoglobin: 10.8 g/dL — ABNORMAL LOW (ref 13.0–17.0)
Immature Granulocytes: 1 %
Lymphocytes Relative: 24 %
Lymphs Abs: 1.1 10*3/uL (ref 0.7–4.0)
MCH: 37.4 pg — ABNORMAL HIGH (ref 26.0–34.0)
MCHC: 35.6 g/dL (ref 30.0–36.0)
MCV: 104.8 fL — ABNORMAL HIGH (ref 80.0–100.0)
Monocytes Absolute: 0.5 10*3/uL (ref 0.1–1.0)
Monocytes Relative: 12 %
Neutro Abs: 2.5 10*3/uL (ref 1.7–7.7)
Neutrophils Relative %: 56 %
Platelets: 204 10*3/uL (ref 150–400)
RBC: 2.89 MIL/uL — ABNORMAL LOW (ref 4.22–5.81)
RDW: 16.7 % — ABNORMAL HIGH (ref 11.5–15.5)
WBC: 4.4 10*3/uL (ref 4.0–10.5)
nRBC: 0 % (ref 0.0–0.2)

## 2021-12-12 LAB — COMPREHENSIVE METABOLIC PANEL
ALT: 28 U/L (ref 0–44)
AST: 41 U/L (ref 15–41)
Albumin: 3.8 g/dL (ref 3.5–5.0)
Alkaline Phosphatase: 69 U/L (ref 38–126)
Anion gap: 8 (ref 5–15)
BUN: 26 mg/dL — ABNORMAL HIGH (ref 8–23)
CO2: 26 mmol/L (ref 22–32)
Calcium: 9.2 mg/dL (ref 8.9–10.3)
Chloride: 100 mmol/L (ref 98–111)
Creatinine, Ser: 1.76 mg/dL — ABNORMAL HIGH (ref 0.61–1.24)
GFR, Estimated: 42 mL/min — ABNORMAL LOW (ref >60)
Glucose, Bld: 116 mg/dL — ABNORMAL HIGH (ref 70–99)
Potassium: 4.2 mmol/L (ref 3.5–5.1)
Sodium: 134 mmol/L — ABNORMAL LOW (ref 135–145)
Total Bilirubin: 0.7 mg/dL (ref 0.3–1.2)
Total Protein: 7.2 g/dL (ref 6.5–8.1)

## 2021-12-12 LAB — MAGNESIUM: Magnesium: 2.2 mg/dL (ref 1.7–2.4)

## 2021-12-12 NOTE — Progress Notes (Signed)
Virtual Visit via Telephone Note  I connected with Todd Richardson on 12/12/21 at  4:00 PM EDT by telephone and verified that I am speaking with the correct person using two identifiers.  Location: Patient: At home Provider: In the office   I discussed the limitations, risks, security and privacy concerns of performing an evaluation and management service by telephone and the availability of in person appointments. I also discussed with the patient that there may be a patient responsible charge related to this service. The patient expressed understanding and agreed to proceed.   History of Present Illness: Todd Richardson is seen in our clinic for T4N0 sigmoid colon cancer.  He was started on adjuvant Xeloda on 09/05/2021 and he completed cycle 4 on 11/22/2021.  We have held his Xeloda for grade 2 hand-foot skin reaction.   Observations/Objective: He reports that skin is still peeling of his palms.  He is not able to open bottle caps.  Overall the ulceration looks better.  He reports some pain in the left foot more than right foot on walking.  He is applying Clobetasone cream twice daily.  Assessment and Plan:  1.  T4N0 sigmoid colon cancer: - Xeloda currently on hold.  He was taking 3 tablets twice daily 2 weeks on/1 week off. - I plan to cut back to 2 tablets twice daily once he is ready to start back.  2.  Grade 2 hand-foot skin reaction: - Overall ulceration in the hands have improved.  He still has decreased function.  There is some tenderness on walking in the left foot. - I have recommended him to come in person next week to evaluate the extent of skin lesions and to see if we can go back on treatment. - I have reviewed his labs from today.  3.  Nausea: - Continue Compazine 10 mg every 6 hours as needed which is helping.   Follow Up Instructions:    I discussed the assessment and treatment plan with the patient. The patient was provided an opportunity to ask questions and all were  answered. The patient agreed with the plan and demonstrated an understanding of the instructions.   The patient was advised to call back or seek an in-person evaluation if the symptoms worsen or if the condition fails to improve as anticipated.  I provided 22 minutes of non-face-to-face time during this encounter.   Derek Jack, MD

## 2021-12-14 LAB — CEA: CEA: 2.5 ng/mL (ref 0.0–4.7)

## 2021-12-18 ENCOUNTER — Other Ambulatory Visit (HOSPITAL_COMMUNITY): Payer: Self-pay

## 2021-12-25 ENCOUNTER — Other Ambulatory Visit (HOSPITAL_COMMUNITY): Payer: Self-pay

## 2022-01-01 ENCOUNTER — Other Ambulatory Visit (HOSPITAL_COMMUNITY): Payer: Self-pay

## 2022-01-01 ENCOUNTER — Inpatient Hospital Stay: Payer: Medicare Other | Attending: Oncology

## 2022-01-01 DIAGNOSIS — C187 Malignant neoplasm of sigmoid colon: Secondary | ICD-10-CM | POA: Insufficient documentation

## 2022-01-01 DIAGNOSIS — Z79899 Other long term (current) drug therapy: Secondary | ICD-10-CM | POA: Diagnosis not present

## 2022-01-01 DIAGNOSIS — C19 Malignant neoplasm of rectosigmoid junction: Secondary | ICD-10-CM

## 2022-01-01 DIAGNOSIS — R110 Nausea: Secondary | ICD-10-CM | POA: Diagnosis not present

## 2022-01-01 DIAGNOSIS — M25561 Pain in right knee: Secondary | ICD-10-CM | POA: Diagnosis not present

## 2022-01-01 LAB — CBC WITH DIFFERENTIAL/PLATELET
Abs Immature Granulocytes: 0.02 10*3/uL (ref 0.00–0.07)
Basophils Absolute: 0 10*3/uL (ref 0.0–0.1)
Basophils Relative: 1 %
Eosinophils Absolute: 0.4 10*3/uL (ref 0.0–0.5)
Eosinophils Relative: 8 %
HCT: 28.5 % — ABNORMAL LOW (ref 39.0–52.0)
Hemoglobin: 9.6 g/dL — ABNORMAL LOW (ref 13.0–17.0)
Immature Granulocytes: 0 %
Lymphocytes Relative: 19 %
Lymphs Abs: 1 10*3/uL (ref 0.7–4.0)
MCH: 36.9 pg — ABNORMAL HIGH (ref 26.0–34.0)
MCHC: 33.7 g/dL (ref 30.0–36.0)
MCV: 109.6 fL — ABNORMAL HIGH (ref 80.0–100.0)
Monocytes Absolute: 0.5 10*3/uL (ref 0.1–1.0)
Monocytes Relative: 9 %
Neutro Abs: 3.2 10*3/uL (ref 1.7–7.7)
Neutrophils Relative %: 63 %
Platelets: 264 10*3/uL (ref 150–400)
RBC: 2.6 MIL/uL — ABNORMAL LOW (ref 4.22–5.81)
RDW: 14.9 % (ref 11.5–15.5)
WBC: 5.1 10*3/uL (ref 4.0–10.5)
nRBC: 0 % (ref 0.0–0.2)

## 2022-01-01 LAB — COMPREHENSIVE METABOLIC PANEL
ALT: 24 U/L (ref 0–44)
AST: 28 U/L (ref 15–41)
Albumin: 3.8 g/dL (ref 3.5–5.0)
Alkaline Phosphatase: 74 U/L (ref 38–126)
Anion gap: 5 (ref 5–15)
BUN: 22 mg/dL (ref 8–23)
CO2: 23 mmol/L (ref 22–32)
Calcium: 9.4 mg/dL (ref 8.9–10.3)
Chloride: 112 mmol/L — ABNORMAL HIGH (ref 98–111)
Creatinine, Ser: 1.58 mg/dL — ABNORMAL HIGH (ref 0.61–1.24)
GFR, Estimated: 48 mL/min — ABNORMAL LOW (ref >60)
Glucose, Bld: 109 mg/dL — ABNORMAL HIGH (ref 70–99)
Potassium: 3.8 mmol/L (ref 3.5–5.1)
Sodium: 140 mmol/L (ref 135–145)
Total Bilirubin: 0.8 mg/dL (ref 0.3–1.2)
Total Protein: 6.9 g/dL (ref 6.5–8.1)

## 2022-01-02 ENCOUNTER — Inpatient Hospital Stay: Payer: Medicare Other | Admitting: Hematology

## 2022-01-02 ENCOUNTER — Other Ambulatory Visit (HOSPITAL_COMMUNITY): Payer: Self-pay

## 2022-01-02 VITALS — BP 116/76 | HR 63 | Temp 98.2°F | Resp 18 | Ht 67.0 in | Wt 163.9 lb

## 2022-01-02 DIAGNOSIS — C180 Malignant neoplasm of cecum: Secondary | ICD-10-CM

## 2022-01-02 DIAGNOSIS — C187 Malignant neoplasm of sigmoid colon: Secondary | ICD-10-CM | POA: Diagnosis not present

## 2022-01-02 LAB — CEA: CEA: 1.4 ng/mL (ref 0.0–4.7)

## 2022-01-02 MED ORDER — TRAMADOL HCL 50 MG PO TABS: 50.0000 mg | ORAL_TABLET | Freq: Every day | ORAL | 0 refills | Status: DC | PRN

## 2022-01-02 NOTE — Patient Instructions (Signed)
[place] at [facility] Discharge Instructions   You were seen and examined today by Dr. Delton Coombes.  He reviewed your lab work which is normal/stable.   Restart Xeloda but at a reduced dose - 2 pills twice a day. Take like this for 2 weeks in a row then take a week off before restarting.  Try the Tramadol for pain control. You make take once a day as needed for pain.   Try to increase your water intake to 6 bottles a day.   If you have any side effects or symptoms related to restarting the Xeloda, please contact us at [phone].   We will see you back in 6 weeks.    Thank you for choosing [place] at [facility] to provide your oncology and hematology care.  To afford each patient quality time with our provider, please arrive at least 15 minutes before your scheduled appointment time.   If you have a lab appointment with the [place] please come in thru the Main Entrance and check in at the main information desk.  You need to re-schedule your appointment should you arrive 10 or more minutes late.  We strive to give you quality time with our providers, and arriving late affects you and other patients whose appointments are after yours.  Also, if you no show three or more times for appointments you may be dismissed from the clinic at the providers discretion.     Again, thank you for choosing [facility].  Our hope is that these requests will decrease the amount of time that you wait before being seen by our physicians.       _____________________________________________________________  Should you have questions after your visit to [facility], please contact our office at [phone] and follow the prompts.  Our office hours are 8:00 a.m. and 4:30 p.m. Monday - Friday.  Please note that voicemails left after 4:00 p.m. may not be returned until the following business day.  We are closed weekends and  major holidays.  You do have access to a nurse 24-7, just call the main number to the clinic [phone] and do not press any options, hold on the line and a nurse will answer the phone.    For prescription refill requests, have your pharmacy contact our office and allow 72 hours.    Due to Covid, you will need to wear a mask upon entering the hospital. If you do not have a mask, a mask will be given to you at the Main Entrance upon arrival. For doctor visits, patients may have 1 support person age 81 or older with them. For treatment visits, patients can not have anyone with them due to social distancing guidelines and our immunocompromised population.

## 2022-01-02 NOTE — Progress Notes (Signed)
[facility], [place] [address]   CLINIC:  Medical Oncology/Hematology  PCP:  [name], MD [address] [place] [address] [phone]   REASON FOR VISIT:  Follow-up for rectosigmoid cancer  PRIOR THERAPY: none  NGS Results: not done  CURRENT THERAPY: Xeloda  BRIEF ONCOLOGIC HISTORY:  Oncology History  Cecal cancer ([place])  07/31/2021 Cancer Staging   Staging form: Colon and Rectum, AJCC 8th Edition - Clinical stage from 07/31/2021: Stage IIA (cT3(2), cN0, cM0) - Signed by Derwood Kaplan, MD on 09/01/2021 Histopathologic type: Adenocarcinoma, NOS Stage prefix: Initial diagnosis Total positive nodes: 0 Total nodes examined: 35 Histologic grade (G): G2 Histologic grading system: 4 grade system Laterality: Right Bilateral cancer: Yes Tumor size (mm): 74 Multiple tumors: Yes Number of tumors: 2 Lymph-vascular invasion (LVI): LVI not present (absent)/not identified Diagnostic confirmation: Positive histology Specimen type: Excision Staged by: Managing physician Tumor deposits (TD): Absent Perineural invasion (PNI): Absent Microsatellite instability (MSI): Stable KRAS mutation: Not assessed NRAS mutation: Not assessed BRAF mutation: Not assessed Stage used in treatment planning: Yes National guidelines used in treatment planning: Yes Type of national guideline used in treatment planning: NCCN   08/28/2021 Initial Diagnosis   Cecal cancer ([place])   Cancer of rectosigmoid (colon) ([place])  07/31/2021 Initial Diagnosis   Cancer of rectosigmoid (colon) ([place])   07/31/2021 Cancer Staging   Staging form: Colon and Rectum, AJCC 8th Edition - Clinical stage from 07/31/2021: Stage IIB (cT4a(2), cN0, cM0) - Signed by Derwood Kaplan, MD on 09/01/2021 Histopathologic type: Adenocarcinoma, NOS Stage prefix: Initial diagnosis Total positive nodes: 0 Total nodes examined: 35 Histologic grade (G): G2 Histologic grading system: 4  grade system Laterality: Left Bilateral cancer: Yes Tumor size (mm): 83 Multiple tumors: Yes Number of tumors: 2 Lymph-vascular invasion (LVI): LVI not present (absent)/not identified Diagnostic confirmation: Positive histology Specimen type: Excision Staged by: Managing physician Tumor deposits (TD): Absent Carcinoembryonic antigen (CEA) (ng/mL): 1.6 Perineural invasion (PNI): Absent Microsatellite instability (MSI): Stable KRAS mutation: Not assessed NRAS mutation: Not assessed BRAF mutation: Not assessed Stage used in treatment planning: Yes National guidelines used in treatment planning: Yes Type of national guideline used in treatment planning: NCCN   09/12/2021 - 09/12/2021 Chemotherapy   Patient is on Treatment Plan : COLORECTAL Capecitabine q21d       CANCER STAGING:  Cancer Staging  Cancer of rectosigmoid (colon) ([place]) Staging form: Colon and Rectum, AJCC 8th Edition - Clinical stage from 07/31/2021: Stage IIB (cT4a(2), cN0, cM0) - Signed by Derwood Kaplan, MD on 09/01/2021  Cecal cancer [facility]) Staging form: Colon and Rectum, AJCC 8th Edition - Clinical stage from 07/31/2021: Stage IIA (cT3(2), cN0, cM0) - Signed by Derwood Kaplan, MD on 09/01/2021   INTERVAL HISTORY:  [name], a 66 y.o. male, returns for follow-up of: Cancer.  Xeloda is on hold due to hand-foot skin reaction.  Mobic is also on hold due to increased creatinine.  Reports slight worsening of right knee pain as he is off of Mobic.  Skin peeling in the palms and soles has resolved.  REVIEW OF SYSTEMS:  Review of Systems  Constitutional:  Negative for appetite change and fatigue.  HENT:   Negative for mouth sores.   Gastrointestinal:  Negative for abdominal pain.  Musculoskeletal:  Positive for arthralgias (Right knee pain stable).  Skin:  Negative for rash.  Neurological:  Negative for dizziness.  All other systems reviewed and are negative.  PAST MEDICAL/SURGICAL HISTORY:  Past  Medical History:  Diagnosis Date   Arthritis    Asthma    Vertigo    Past Surgical History:  Procedure Laterality Date   BIOPSY  07/31/2021   Procedure: BIOPSY;  Surgeon: Eloise Harman, DO;  Location: [facility];  Service: Endoscopy;;   CATARACT EXTRACTION Left    COLON RESECTION N/A 08/02/2021   Procedure: En bloc Resection of Colon and Small bowel;  Surgeon: Virl Cagey, MD;  Location: [facility] ORS;  Service: General;  Laterality: N/A;   COLOSTOMY Right 08/02/2021   Procedure: ILEOSTOMY;  Surgeon: Virl Cagey, MD;  Location: [facility] OR[facility];  Service: General;  Laterality: Right;   FLEXIBLE SIGMOIDOSCOPY  07/31/2021   Procedure: FLEXIBLE SIGMOIDOSCOPY;  Surgeon: Eloise Harman, DO;  Location: [facility];  Service: Endoscopy;;   SUBMUCOSAL TATTOO INJECTION  07/31/2021   Procedure: SUBMUCOSAL TATTOO INJECTION;  Surgeon: Eloise Harman, DO;  Location: [facility];  Service: Endoscopy;;   SUBTOTAL COLECTOMY     end ileostomy; sigmoid and cecal cancers    SOCIAL HISTORY:  Social History   Socioeconomic History   Marital status: Single    Spouse name: Not on file   Number of children: 0   Years of education: 12+1   Highest education level: Some college, no degree  Occupational History   Not on file  Tobacco Use   Smoking status: Never   Smokeless tobacco: Never  Vaping Use   Vaping Use: Never used  Substance and Sexual Activity   Alcohol use: Not Currently    Alcohol/week: 4.0 standard drinks of alcohol    Types: 4 Cans of beer per week    Comment: every day-none in 6 months or more   Drug use: Never   Sexual activity: Not Currently  Other Topics Concern   Not on file  Social History Narrative   Not on file   Social Determinants of Health   Financial Resource Strain: Not on file  Food Insecurity: Not on file  Transportation Needs: Not on file  Physical Activity: Not on file  Stress: No Stress Concern Present (08/31/2021)   [place]    Feeling of Stress : Not at all  Social Connections: Not on file  Intimate Partner Violence: Not At Risk (08/31/2021)   Humiliation, Afraid, Rape, and Kick questionnaire    Fear of Current or Ex-Partner: No    Emotionally Abused: No    Physically Abused: No    Sexually Abused: No    FAMILY HISTORY:  Family History  Problem Relation Age of Onset   Hypertension Mother    Diabetes Mother    Parkinson's disease Father    Dementia Father     CURRENT MEDICATIONS:  Current Outpatient Medications  Medication Sig Dispense Refill   capecitabine (XELODA) 500 MG tablet Take 3 tablets (1,500 mg total) by mouth 2 (two) times daily after a meal. Take for 14 days on, 7 days off. Repeat every 21 days. 84 tablet 5   cetirizine (ZYRTEC) 10 MG tablet Take 10 mg by mouth as needed for allergies.     Clobetasol Prop Emollient Base 0.05 % emollient cream Apply to forearms twice daily 30 g 3   meloxicam (MOBIC) 7.5 MG tablet Take 1 tablet by mouth once daily 30 tablet 0   prochlorperazine (COMPAZINE) 10 MG tablet Take 1 tablet (10 mg total) by mouth every 6 (six) hours as  needed for nausea or vomiting. 60 tablet 4   psyllium (HYDROCIL/METAMUCIL) 95 % PACK Take 1 packet by mouth daily. 240 each 2   silver sulfADIAZINE (SILVADENE) 1 % cream 1 application     traMADol (ULTRAM) 50 MG tablet Take 1 tablet (50 mg total) by mouth daily as needed. 30 tablet 0   No current facility-administered medications for this visit.    ALLERGIES:  Allergies  Allergen Reactions   Apple Anaphylaxis    Other reaction(s): Unknown   Apple Juice Anaphylaxis    Throat swelling     PHYSICAL EXAM:  Performance status (ECOG): 1 - Symptomatic but completely ambulatory  Vitals:   01/02/22 1104  BP: 116/76  Pulse: 63  Resp: 18  Temp: 98.2 F (36.8 C)  SpO2: 99%   Wt Readings from Last 3 Encounters:  01/02/22 163 lb 14.4 oz (74.3 kg)  11/26/21 155 lb (70.3 kg)  11/05/21  162 lb 8 oz (73.7 kg)   Physical Exam Vitals reviewed.  Constitutional:      Appearance: Normal appearance.  Cardiovascular:     Rate and Rhythm: Normal rate and regular rhythm.     Pulses: Normal pulses.     Heart sounds: Normal heart sounds.  Pulmonary:     Effort: Pulmonary effort is normal.     Breath sounds: Normal breath sounds.  Neurological:     General: No focal deficit present.     Mental Status: He is alert and oriented to person, place, and time.  Psychiatric:        Mood and Affect: Mood normal.        Behavior: Behavior normal.      LABORATORY DATA:  I have reviewed the labs as listed.     Latest Ref Rng & Units 01/01/2022   11:09 AM 12/12/2021    9:40 AM 11/26/2021    1:46 PM  CBC  WBC 4.0 - 10.5 K/uL 5.1  4.4  7.3   Hemoglobin 13.0 - 17.0 g/dL 9.6  10.8  10.0   Hematocrit 39.0 - 52.0 % 28.5  30.3  27.8   Platelets 150 - 400 K/uL 264  204  318       Latest Ref Rng & Units 01/01/2022   11:09 AM 12/12/2021    9:40 AM 11/26/2021    1:46 PM  CMP  Glucose 70 - 99 mg/dL 109  116  105   BUN 8 - 23 mg/dL 22  26  31    Creatinine 0.61 - 1.24 mg/dL 1.58  1.76  1.73   Sodium 135 - 145 mmol/L 140  134  131   Potassium 3.5 - 5.1 mmol/L 3.8  4.2  4.0   Chloride 98 - 111 mmol/L 112  100  99   CO2 22 - 32 mmol/L 23  26  23    Calcium 8.9 - 10.3 mg/dL 9.4  9.2  9.4   Total Protein 6.5 - 8.1 g/dL 6.9  7.2  7.4   Total Bilirubin 0.3 - 1.2 mg/dL 0.8  0.7  1.4   Alkaline Phos 38 - 126 U/L 74  69  72   AST 15 - 41 U/L 28  41  26   ALT 0 - 44 U/L 24  28  20      DIAGNOSTIC IMAGING:  I have independently reviewed the scans and discussed with the patient. No results found.   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  Total examined lymph nodes 35.  MMR-preserved. - CT CAP on 07/31/2021  with no evidence of metastatic disease. - Xeloda 1500 mg twice daily 2 weeks on/1 week off in the adjuvant setting cycle 1 started on 09/05/2021.  Cycle 4 from 11/09/2021 through 11/22/2021, Xeloda held after that for hand-foot skin reaction.  Xeloda dose decreased to 1000 mg twice daily for cycle 5 to start on 01/03/2022    Social/family history: - He lives by himself.  He is a retired Clinical biochemist.  No exposure to asbestos.  Non-smoker. - Father had colon cancer in his 19s.  Maternal uncle had colon cancer.   PLAN:  T4N0 sigmoid colon cancer: - His hand-foot skin reaction completely improved. - Energy levels and GI symptoms also completely improved. - Reviewed labs today which showed creatinine 1.58, improved from 1.76 although has been high for the last 3 times.  He is not on any other medications.  Mobic is on hold.  He is drinking about 4-5 bottles of 16 ounce waters every day. - Recommend increasing to 6 bottles a day. - Recommend starting cycle 5 Xeloda 1000 mg twice daily, 2 weeks on/1 week off.  He will come back in 6 weeks for follow-up.  We will do anemia panel as he is hemoglobin is 9.6 with MCV 109.  2.  Grade 2 HFSR: - This has resolved as he is off of Xeloda at this time. - I will dose reduce Xeloda to 1000 mg twice daily 2 weeks on/1 week off. - He will be reevaluated in 6 weeks.  3.  Nausea: - Continue Compazine as needed every 6 hours.  4.  Knee pain: - Because of elevated creatinine, I have recommended against starting Mobic. - Recommend tramadol 50 mg once daily as needed as his pain is mostly during the daytime.    Orders placed this encounter:  No orders of the defined types were placed in this encounter.    Derek Jack, MD [place] [phone]

## 2022-01-07 ENCOUNTER — Other Ambulatory Visit: Payer: Self-pay | Admitting: *Deleted

## 2022-01-07 DIAGNOSIS — D500 Iron deficiency anemia secondary to blood loss (chronic): Secondary | ICD-10-CM

## 2022-01-07 DIAGNOSIS — C19 Malignant neoplasm of rectosigmoid junction: Secondary | ICD-10-CM

## 2022-01-18 ENCOUNTER — Other Ambulatory Visit (HOSPITAL_COMMUNITY): Payer: Self-pay

## 2022-01-21 ENCOUNTER — Other Ambulatory Visit (HOSPITAL_COMMUNITY): Payer: Self-pay

## 2022-01-22 ENCOUNTER — Other Ambulatory Visit (HOSPITAL_COMMUNITY): Payer: Self-pay

## 2022-01-23 ENCOUNTER — Other Ambulatory Visit (HOSPITAL_COMMUNITY): Payer: Self-pay

## 2022-02-08 ENCOUNTER — Other Ambulatory Visit (HOSPITAL_COMMUNITY): Payer: Self-pay

## 2022-02-18 ENCOUNTER — Inpatient Hospital Stay: Payer: Medicare Other | Attending: Oncology

## 2022-02-18 ENCOUNTER — Other Ambulatory Visit (HOSPITAL_COMMUNITY): Payer: Self-pay

## 2022-02-18 DIAGNOSIS — C187 Malignant neoplasm of sigmoid colon: Secondary | ICD-10-CM | POA: Insufficient documentation

## 2022-02-18 DIAGNOSIS — M25569 Pain in unspecified knee: Secondary | ICD-10-CM | POA: Diagnosis not present

## 2022-02-18 DIAGNOSIS — Z79899 Other long term (current) drug therapy: Secondary | ICD-10-CM | POA: Insufficient documentation

## 2022-02-18 DIAGNOSIS — R110 Nausea: Secondary | ICD-10-CM | POA: Insufficient documentation

## 2022-02-18 DIAGNOSIS — C19 Malignant neoplasm of rectosigmoid junction: Secondary | ICD-10-CM

## 2022-02-18 DIAGNOSIS — D500 Iron deficiency anemia secondary to blood loss (chronic): Secondary | ICD-10-CM

## 2022-02-18 LAB — COMPREHENSIVE METABOLIC PANEL
ALT: 20 U/L (ref 0–44)
AST: 25 U/L (ref 15–41)
Albumin: 3.9 g/dL (ref 3.5–5.0)
Alkaline Phosphatase: 74 U/L (ref 38–126)
Anion gap: 9 (ref 5–15)
BUN: 19 mg/dL (ref 8–23)
CO2: 23 mmol/L (ref 22–32)
Calcium: 9.4 mg/dL (ref 8.9–10.3)
Chloride: 105 mmol/L (ref 98–111)
Creatinine, Ser: 1.38 mg/dL — ABNORMAL HIGH (ref 0.61–1.24)
GFR, Estimated: 57 mL/min — ABNORMAL LOW (ref >60)
Glucose, Bld: 103 mg/dL — ABNORMAL HIGH (ref 70–99)
Potassium: 3.7 mmol/L (ref 3.5–5.1)
Sodium: 137 mmol/L (ref 135–145)
Total Bilirubin: 1.2 mg/dL (ref 0.3–1.2)
Total Protein: 7.2 g/dL (ref 6.5–8.1)

## 2022-02-18 LAB — CBC WITH DIFFERENTIAL/PLATELET
Abs Immature Granulocytes: 0.02 10*3/uL (ref 0.00–0.07)
Basophils Absolute: 0 10*3/uL (ref 0.0–0.1)
Basophils Relative: 1 %
Eosinophils Absolute: 0.2 10*3/uL (ref 0.0–0.5)
Eosinophils Relative: 3 %
HCT: 31.2 % — ABNORMAL LOW (ref 39.0–52.0)
Hemoglobin: 10.7 g/dL — ABNORMAL LOW (ref 13.0–17.0)
Immature Granulocytes: 0 %
Lymphocytes Relative: 18 %
Lymphs Abs: 1.1 10*3/uL (ref 0.7–4.0)
MCH: 37.2 pg — ABNORMAL HIGH (ref 26.0–34.0)
MCHC: 34.3 g/dL (ref 30.0–36.0)
MCV: 108.3 fL — ABNORMAL HIGH (ref 80.0–100.0)
Monocytes Absolute: 0.7 10*3/uL (ref 0.1–1.0)
Monocytes Relative: 12 %
Neutro Abs: 4 10*3/uL (ref 1.7–7.7)
Neutrophils Relative %: 66 %
Platelets: 226 10*3/uL (ref 150–400)
RBC: 2.88 MIL/uL — ABNORMAL LOW (ref 4.22–5.81)
RDW: 14.7 % (ref 11.5–15.5)
WBC: 6 10*3/uL (ref 4.0–10.5)
nRBC: 0 % (ref 0.0–0.2)

## 2022-02-18 LAB — IRON AND TIBC
Iron: 59 ug/dL (ref 45–182)
Saturation Ratios: 15 % — ABNORMAL LOW (ref 17.9–39.5)
TIBC: 389 ug/dL (ref 250–450)
UIBC: 330 ug/dL

## 2022-02-18 LAB — MAGNESIUM: Magnesium: 2.5 mg/dL — ABNORMAL HIGH (ref 1.7–2.4)

## 2022-02-18 LAB — FERRITIN: Ferritin: 198 ng/mL (ref 24–336)

## 2022-02-19 LAB — CEA: CEA: 1.6 ng/mL (ref 0.0–4.7)

## 2022-02-20 ENCOUNTER — Other Ambulatory Visit (HOSPITAL_COMMUNITY): Payer: Self-pay

## 2022-02-20 ENCOUNTER — Other Ambulatory Visit: Payer: Self-pay

## 2022-02-20 ENCOUNTER — Inpatient Hospital Stay: Payer: Medicare Other | Admitting: Hematology

## 2022-02-20 ENCOUNTER — Encounter: Payer: Self-pay | Admitting: Hematology

## 2022-02-20 VITALS — BP 132/93 | HR 100 | Temp 98.6°F | Resp 18 | Wt 162.6 lb

## 2022-02-20 DIAGNOSIS — C19 Malignant neoplasm of rectosigmoid junction: Secondary | ICD-10-CM | POA: Diagnosis not present

## 2022-02-20 DIAGNOSIS — C187 Malignant neoplasm of sigmoid colon: Secondary | ICD-10-CM | POA: Diagnosis not present

## 2022-02-20 NOTE — Progress Notes (Signed)
Patient is taking Xeloda as prescribed.  He has not missed any doses and reports no side effects at this time.   

## 2022-02-20 NOTE — Patient Instructions (Signed)
[place] at [facility] Discharge Instructions   You were seen and examined today by Dr. Delton Coombes.  He reviewed the results of your lab work which are normal/stable.   Continue Xeloda as prescribed.   We will repeat a scan prior to your next visit.  Return as scheduled in 6 weeks.    Thank you for choosing [place] at [facility] to provide your oncology and hematology care.  To afford each patient quality time with our provider, please arrive at least 15 minutes before your scheduled appointment time.   If you have a lab appointment with the [facility] please come in thru the Main Entrance and check in at the main information desk.  You need to re-schedule your appointment should you arrive 10 or more minutes late.  We strive to give you quality time with our providers, and arriving late affects you and other patients whose appointments are after yours.  Also, if you no show three or more times for appointments you may be dismissed from the clinic at the providers discretion.     Again, thank you for choosing [facility].  Our hope is that these requests will decrease the amount of time that you wait before being seen by our physicians.       _____________________________________________________________  Should you have questions after your visit to [facility], please contact our office at [phone] and follow the prompts.  Our office hours are 8:00 a.m. and 4:30 p.m. Monday - Friday.  Please note that voicemails left after 4:00 p.m. may not be returned until the following business day.  We are closed weekends and major holidays.  You do have access to a nurse 24-7, just call the main number to the clinic [phone] and do not press any options, hold on the line and a nurse will answer the phone.    For prescription refill requests, have your pharmacy contact our office and allow 72 hours.    Due to  Covid, you will need to wear a mask upon entering the hospital. If you do not have a mask, a mask will be given to you at the Main Entrance upon arrival. For doctor visits, patients may have 1 support person age 42 or older with them. For treatment visits, patients can not have anyone with them due to social distancing guidelines and our immunocompromised population.

## 2022-02-20 NOTE — Progress Notes (Signed)
[place], [place] [address]   CLINIC:  Medical Oncology/Hematology  PCP:  [name], MD [address] [place] [address] [phone]   REASON FOR VISIT:  Follow-up for rectosigmoid cancer  PRIOR THERAPY: none  NGS Results: not done  CURRENT THERAPY: Xeloda  BRIEF ONCOLOGIC HISTORY:  Oncology History  Cecal cancer ([place])  07/31/2021 Cancer Staging   Staging form: Colon and Rectum, AJCC 8th Edition - Clinical stage from 07/31/2021: Stage IIA (cT3(2), cN0, cM0) - Signed by Derwood Kaplan, MD on 09/01/2021 Histopathologic type: Adenocarcinoma, NOS Stage prefix: Initial diagnosis Total positive nodes: 0 Total nodes examined: 35 Histologic grade (G): G2 Histologic grading system: 4 grade system Laterality: Right Bilateral cancer: Yes Tumor size (mm): 74 Multiple tumors: Yes Number of tumors: 2 Lymph-vascular invasion (LVI): LVI not present (absent)/not identified Diagnostic confirmation: Positive histology Specimen type: Excision Staged by: Managing physician Tumor deposits (TD): Absent Perineural invasion (PNI): Absent Microsatellite instability (MSI): Stable KRAS mutation: Not assessed NRAS mutation: Not assessed BRAF mutation: Not assessed Stage used in treatment planning: Yes National guidelines used in treatment planning: Yes Type of national guideline used in treatment planning: NCCN   08/28/2021 Initial Diagnosis   Cecal cancer ([place])   Cancer of rectosigmoid (colon) ([place])  07/31/2021 Initial Diagnosis   Cancer of rectosigmoid (colon) ([place])   07/31/2021 Cancer Staging   Staging form: Colon and Rectum, AJCC 8th Edition - Clinical stage from 07/31/2021: Stage IIB (cT4a(2), cN0, cM0) - Signed by Derwood Kaplan, MD on 09/01/2021 Histopathologic type: Adenocarcinoma, NOS Stage prefix: Initial diagnosis Total positive nodes: 0 Total nodes examined: 35 Histologic grade (G): G2 Histologic grading system: 4  grade system Laterality: Left Bilateral cancer: Yes Tumor size (mm): 83 Multiple tumors: Yes Number of tumors: 2 Lymph-vascular invasion (LVI): LVI not present (absent)/not identified Diagnostic confirmation: Positive histology Specimen type: Excision Staged by: Managing physician Tumor deposits (TD): Absent Carcinoembryonic antigen (CEA) (ng/mL): 1.6 Perineural invasion (PNI): Absent Microsatellite instability (MSI): Stable KRAS mutation: Not assessed NRAS mutation: Not assessed BRAF mutation: Not assessed Stage used in treatment planning: Yes National guidelines used in treatment planning: Yes Type of national guideline used in treatment planning: NCCN   09/12/2021 - 09/12/2021 Chemotherapy   Patient is on Treatment Plan : COLORECTAL Capecitabine q21d       CANCER STAGING:  Cancer Staging  Cancer of rectosigmoid (colon) ([place]) Staging form: Colon and Rectum, AJCC 8th Edition - Clinical stage from 07/31/2021: Stage IIB (cT4a(2), cN0, cM0) - Signed by Derwood Kaplan, MD on 09/01/2021  Cecal cancer [facility]) Staging form: Colon and Rectum, AJCC 8th Edition - Clinical stage from 07/31/2021: Stage IIA (cT3(2), cN0, cM0) - Signed by Derwood Kaplan, MD on 09/01/2021   INTERVAL HISTORY:  Mr. CHANSON TEEMS, a 66 y.o. male, seen for follow-up of rectosigmoid cancer.  He denies any blistering of palms and soles.  He started cycle 7 of Xeloda 1000 mg twice daily 2 weeks on/1 week off on 02/13/2022.  He has occasional nausea but denied any vomiting.  He reports some fatigue and takes naps.  He is taking tramadol half tablet for knee pains which is helping.  REVIEW OF SYSTEMS:  Review of Systems  Constitutional:  Positive for fatigue. Negative for appetite change.  HENT:   Negative for mouth sores.   Gastrointestinal:  Positive for nausea. Negative for abdominal pain.  Skin:  Negative for rash.  Neurological:  Negative for dizziness.  All other systems reviewed and are  negative.   PAST MEDICAL/SURGICAL HISTORY:  Past Medical History:  Diagnosis Date   Arthritis    Asthma    Vertigo    Past Surgical History:  Procedure Laterality Date   BIOPSY  07/31/2021   Procedure: BIOPSY;  Surgeon: Eloise Harman, DO;  Location[facility];  Service: Endoscopy;;   CATARACT EXTRACTION Left    COLON RESECTION N/A 08/02/2021   Procedure: En bloc Resection of Colon and Small bowel;  Surgeon: Virl Cagey, MD;  Location: [facility];  Service: General;  Laterality: N/A;   COLOSTOMY Right 08/02/2021   Procedure: ILEOSTOMY;  Surgeon: Virl Cagey, MD;  Location: [facility];  Service: General;  Laterality: Right;   FLEXIBLE SIGMOIDOSCOPY  07/31/2021   Procedure: FLEXIBLE SIGMOIDOSCOPY;  Surgeon: Eloise Harman, DO;  Location: [facility];  Service: Endoscopy;;   SUBMUCOSAL TATTOO INJECTION  07/31/2021   Procedure: SUBMUCOSAL TATTOO INJECTION;  Surgeon: Eloise Harman, DO;  Location: [facility];  Service: Endoscopy;;   SUBTOTAL COLECTOMY     end ileostomy; sigmoid and cecal cancers    SOCIAL HISTORY:  Social History   Socioeconomic History   Marital status: Single    Spouse name: Not on file   Number of children: 0   Years of education: 12+1   Highest education level: Some college, no degree  Occupational History   Not on file  Tobacco Use   Smoking status: Never   Smokeless tobacco: Never  Vaping Use   Vaping Use: Never used  Substance and Sexual Activity   Alcohol use: Not Currently    Alcohol/week: 4.0 standard drinks of alcohol    Types: 4 Cans of beer per week    Comment: every day-none in 6 months or more   Drug use: Never   Sexual activity: Not Currently  Other Topics Concern   Not on file  Social History Narrative   Not on file   Social Determinants of Health   Financial Resource Strain: Not on file  Food Insecurity: Not on file  Transportation Needs: Not on file  Physical Activity: Not on file  Stress: No Stress Concern  Present (08/31/2021)   [place]    Feeling of Stress : Not at all  Social Connections: Not on file  Intimate Partner Violence: Not At Risk (08/31/2021)   Humiliation, Afraid, Rape, and Kick questionnaire    Fear of Current or Ex-Partner: No    Emotionally Abused: No    Physically Abused: No    Sexually Abused: No    FAMILY HISTORY:  Family History  Problem Relation Age of Onset   Hypertension Mother    Diabetes Mother    Parkinson's disease Father    Dementia Father     CURRENT MEDICATIONS:  Current Outpatient Medications  Medication Sig Dispense Refill   capecitabine (XELODA) 500 MG tablet Take 3 tablets (1,500 mg total) by mouth 2 (two) times daily after a meal. Take for 14 days on, 7 days off. Repeat every 21 days. 84 tablet 5   psyllium (HYDROCIL/METAMUCIL) 95 % PACK Take 1 packet by mouth daily. 240 each 2   cetirizine (ZYRTEC) 10 MG tablet Take 10 mg by mouth as needed for allergies. (Patient not taking: Reported on 02/20/2022)     Clobetasol Prop Emollient Base 0.05 % emollient cream Apply to forearms twice daily (Patient not taking: Reported on 02/20/2022) 30 g 3  meloxicam (MOBIC) 7.5 MG tablet Take 1 tablet by mouth once daily (Patient not taking: Reported on 02/20/2022) 30 tablet 0   prochlorperazine (COMPAZINE) 10 MG tablet Take 1 tablet (10 mg total) by mouth every 6 (six) hours as needed for nausea or vomiting. (Patient not taking: Reported on 02/20/2022) 60 tablet 4   silver sulfADIAZINE (SILVADENE) 1 % cream 1 application (Patient not taking: Reported on 02/20/2022)     traMADol (ULTRAM) 50 MG tablet Take 1 tablet (50 mg total) by mouth daily as needed. (Patient not taking: Reported on 02/20/2022) 30 tablet 0   No current facility-administered medications for this visit.    ALLERGIES:  Allergies  Allergen Reactions   Apple Anaphylaxis    Other reaction(s): Unknown   Apple Juice Anaphylaxis     Throat swelling     PHYSICAL EXAM:  Performance status (ECOG): 1 - Symptomatic but completely ambulatory  Vitals:   02/20/22 1046  BP: (!) 132/93  Pulse: 100  Resp: 18  Temp: 98.6 F (37 C)  SpO2: 100%   Wt Readings from Last 3 Encounters:  02/20/22 162 lb 9.6 oz (73.8 kg)  01/02/22 163 lb 14.4 oz (74.3 kg)  11/26/21 155 lb (70.3 kg)   Physical Exam Vitals reviewed.  Constitutional:      Appearance: Normal appearance.  Cardiovascular:     Rate and Rhythm: Normal rate and regular rhythm.     Pulses: Normal pulses.     Heart sounds: Normal heart sounds.  Pulmonary:     Effort: Pulmonary effort is normal.     Breath sounds: Normal breath sounds.  Neurological:     General: No focal deficit present.     Mental Status: He is alert and oriented to person, place, and time.  Psychiatric:        Mood and Affect: Mood normal.        Behavior: Behavior normal.      LABORATORY DATA:  I have reviewed the labs as listed.     Latest Ref Rng & Units 02/18/2022   11:14 AM 01/01/2022   11:09 AM 12/12/2021    9:40 AM  CBC  WBC 4.0 - 10.5 K/uL 6.0  5.1  4.4   Hemoglobin 13.0 - 17.0 g/dL 10.7  9.6  10.8   Hematocrit 39.0 - 52.0 % 31.2  28.5  30.3   Platelets 150 - 400 K/uL 226  264  204       Latest Ref Rng & Units 02/18/2022   11:14 AM 01/01/2022   11:09 AM 12/12/2021    9:40 AM  CMP  Glucose 70 - 99 mg/dL 103  109  116   BUN 8 - 23 mg/dL 19  22  26    Creatinine 0.61 - 1.24 mg/dL 1.38  1.58  1.76   Sodium 135 - 145 mmol/L 137  140  134   Potassium 3.5 - 5.1 mmol/L 3.7  3.8  4.2   Chloride 98 - 111 mmol/L 105  112  100   CO2 22 - 32 mmol/L 23  23  26    Calcium 8.9 - 10.3 mg/dL 9.4  9.4  9.2   Total Protein 6.5 - 8.1 g/dL 7.2  6.9  7.2   Total Bilirubin 0.3 - 1.2 mg/dL 1.2  0.8  0.7   Alkaline Phos 38 - 126 U/L 74  74  69   AST 15 - 41 U/L 25  28  41   ALT 0 - 44 U/L 20  24  28     DIAGNOSTIC IMAGING:  I have independently reviewed the scans and discussed with the  patient. No results found.   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  Total examined lymph nodes 35.  MMR-preserved. - CT CAP on 07/31/2021 with no evidence of metastatic disease. - Xeloda 1500 mg twice daily 2 weeks on/1 week off in the adjuvant setting cycle 1 started on 09/05/2021.  Cycle 4 from 11/09/2021 through 11/22/2021, Xeloda held after that for hand-foot skin reaction.  Xeloda dose decreased to 1000 mg twice daily for cycle 5 to start on 01/03/2022    Social/family history: - He lives by himself.  He is a retired Clinical biochemist.  No exposure to asbestos.  Non-smoker. - Father had colon cancer in his 44s.  Maternal uncle had colon cancer.   PLAN:  T4N0 sigmoid colon cancer: - He does not have any significant side effects. - Cycle 5 of Xeloda on 01/02/2022, cycle 6 on 01/23/2022.  Cycle 7 was started on 02/13/2022. - Reviewed labs today which showed creatinine 1.38 and slightly improved.  CBC shows anemia from myelosuppression.  Ferritin was normal at 198. - Continue with cycle 7.  He will start cycle 8 on 03/06/2022, which will conclude his chemotherapy. - Recommend follow-up in 6 weeks with CT abdomen and pelvis with contrast, CEA level and labs..  2.  Grade 2 HFSR: - He does not have any symptoms or signs of HFSR since we cut back on the dose of Xeloda to 1000 mg twice daily 2 weeks on/1 week off. - I will reevaluate him in 6 weeks.   3.  Nausea: - He has intermittent nausea but no vomiting.  Continue Compazine every 6 hours as needed.  4.  Knee pain: - Because of elevated creatinine, we have held Mobic. - Continue tramadol 50 mg half tablet daily as needed which is helping.    Orders placed this encounter:  Orders Placed This Encounter  Procedures   CT Abdomen Pelvis W Contrast   CBC  with Differential   Comprehensive metabolic panel   CEA   Magnesium   Iron and TIBC ([facility] ONLY)   Ferritin      Derek Jack, MD [place] [phone]

## 2022-02-26 ENCOUNTER — Other Ambulatory Visit (HOSPITAL_COMMUNITY): Payer: Self-pay

## 2022-02-26 ENCOUNTER — Other Ambulatory Visit: Payer: Self-pay | Admitting: Oncology

## 2022-02-26 DIAGNOSIS — C187 Malignant neoplasm of sigmoid colon: Secondary | ICD-10-CM

## 2022-02-26 NOTE — Telephone Encounter (Signed)
He transferred his care to [facility] cancer center many months ago

## 2022-02-27 ENCOUNTER — Other Ambulatory Visit: Payer: Self-pay | Admitting: Hematology

## 2022-02-27 ENCOUNTER — Other Ambulatory Visit (HOSPITAL_COMMUNITY): Payer: Self-pay

## 2022-02-27 DIAGNOSIS — C187 Malignant neoplasm of sigmoid colon: Secondary | ICD-10-CM

## 2022-02-27 MED ORDER — CAPECITABINE 500 MG PO TABS
1500.0000 mg | ORAL_TABLET | Freq: Two times a day (BID) | ORAL | 5 refills | Status: DC
  Filled 2022-02-27: qty 84, 21d supply, fill #0

## 2022-03-04 ENCOUNTER — Other Ambulatory Visit (HOSPITAL_COMMUNITY): Payer: Self-pay

## 2022-03-15 ENCOUNTER — Other Ambulatory Visit (HOSPITAL_COMMUNITY): Payer: Self-pay

## 2022-03-27 ENCOUNTER — Ambulatory Visit (HOSPITAL_COMMUNITY)
Admission: RE | Admit: 2022-03-27 | Discharge: 2022-03-27 | Disposition: A | Discharge status: Home / Self Care | Payer: Medicare Other | Source: Ambulatory Visit | Attending: Hematology | Admitting: Hematology

## 2022-03-27 ENCOUNTER — Inpatient Hospital Stay: Payer: Medicare Other | Attending: Oncology

## 2022-03-27 DIAGNOSIS — C19 Malignant neoplasm of rectosigmoid junction: Secondary | ICD-10-CM | POA: Insufficient documentation

## 2022-03-27 DIAGNOSIS — I700 Atherosclerosis of aorta: Secondary | ICD-10-CM | POA: Insufficient documentation

## 2022-03-27 LAB — CBC WITH DIFFERENTIAL/PLATELET
Abs Immature Granulocytes: 0.01 10*3/uL (ref 0.00–0.07)
Basophils Absolute: 0 10*3/uL (ref 0.0–0.1)
Basophils Relative: 1 %
Eosinophils Absolute: 0.3 10*3/uL (ref 0.0–0.5)
Eosinophils Relative: 5 %
HCT: 30.9 % — ABNORMAL LOW (ref 39.0–52.0)
Hemoglobin: 10.8 g/dL — ABNORMAL LOW (ref 13.0–17.0)
Immature Granulocytes: 0 %
Lymphocytes Relative: 18 %
Lymphs Abs: 0.9 10*3/uL (ref 0.7–4.0)
MCH: 38.7 pg — ABNORMAL HIGH (ref 26.0–34.0)
MCHC: 35 g/dL (ref 30.0–36.0)
MCV: 110.8 fL — ABNORMAL HIGH (ref 80.0–100.0)
Monocytes Absolute: 0.5 10*3/uL (ref 0.1–1.0)
Monocytes Relative: 11 %
Neutro Abs: 3.3 10*3/uL (ref 1.7–7.7)
Neutrophils Relative %: 65 %
Platelets: 276 10*3/uL (ref 150–400)
RBC: 2.79 MIL/uL — ABNORMAL LOW (ref 4.22–5.81)
RDW: 15.8 % — ABNORMAL HIGH (ref 11.5–15.5)
WBC: 5 10*3/uL (ref 4.0–10.5)
nRBC: 0 % (ref 0.0–0.2)

## 2022-03-27 LAB — IRON AND TIBC
Iron: 115 ug/dL (ref 45–182)
Saturation Ratios: 26 % (ref 17.9–39.5)
TIBC: 444 ug/dL (ref 250–450)
UIBC: 329 ug/dL

## 2022-03-27 LAB — COMPREHENSIVE METABOLIC PANEL
ALT: 20 U/L (ref 0–44)
AST: 29 U/L (ref 15–41)
Albumin: 4.1 g/dL (ref 3.5–5.0)
Alkaline Phosphatase: 68 U/L (ref 38–126)
Anion gap: 11 (ref 5–15)
BUN: 21 mg/dL (ref 8–23)
CO2: 24 mmol/L (ref 22–32)
Calcium: 9.9 mg/dL (ref 8.9–10.3)
Chloride: 103 mmol/L (ref 98–111)
Creatinine, Ser: 1.41 mg/dL — ABNORMAL HIGH (ref 0.61–1.24)
GFR, Estimated: 55 mL/min — ABNORMAL LOW (ref >60)
Glucose, Bld: 93 mg/dL (ref 70–99)
Potassium: 4.1 mmol/L (ref 3.5–5.1)
Sodium: 138 mmol/L (ref 135–145)
Total Bilirubin: 0.7 mg/dL (ref 0.3–1.2)
Total Protein: 7.3 g/dL (ref 6.5–8.1)

## 2022-03-27 LAB — MAGNESIUM: Magnesium: 2.3 mg/dL (ref 1.7–2.4)

## 2022-03-27 LAB — FERRITIN: Ferritin: 93 ng/mL (ref 24–336)

## 2022-03-27 MED ORDER — IOHEXOL 300 MG/ML  SOLN
100.0000 mL | Freq: Once | INTRAMUSCULAR | Status: AC | PRN
  Administered 2022-03-27: 100 mL via INTRAVENOUS

## 2022-03-29 LAB — CEA: CEA: 1.5 ng/mL (ref 0.0–4.7)

## 2022-04-03 ENCOUNTER — Inpatient Hospital Stay: Payer: Medicare Other | Attending: Oncology | Admitting: Hematology

## 2022-04-03 VITALS — BP 122/87 | HR 76 | Temp 98.5°F | Resp 18 | Wt 165.2 lb

## 2022-04-03 DIAGNOSIS — Z9221 Personal history of antineoplastic chemotherapy: Secondary | ICD-10-CM | POA: Insufficient documentation

## 2022-04-03 DIAGNOSIS — Z79899 Other long term (current) drug therapy: Secondary | ICD-10-CM | POA: Diagnosis not present

## 2022-04-03 DIAGNOSIS — C19 Malignant neoplasm of rectosigmoid junction: Secondary | ICD-10-CM | POA: Insufficient documentation

## 2022-04-03 DIAGNOSIS — M170 Bilateral primary osteoarthritis of knee: Secondary | ICD-10-CM

## 2022-04-03 DIAGNOSIS — R110 Nausea: Secondary | ICD-10-CM | POA: Insufficient documentation

## 2022-04-03 DIAGNOSIS — M25569 Pain in unspecified knee: Secondary | ICD-10-CM | POA: Diagnosis not present

## 2022-04-03 DIAGNOSIS — Z933 Colostomy status: Secondary | ICD-10-CM | POA: Insufficient documentation

## 2022-04-03 MED ORDER — MELOXICAM 7.5 MG PO TABS: 7.5000 mg | ORAL_TABLET | Freq: Every day | ORAL | 3 refills | Status: DC

## 2022-04-03 NOTE — Progress Notes (Signed)
[place], [place] [address]   CLINIC:  Medical Oncology/Hematology  PCP:  [name], MD [address] [place] [address] [phone]   REASON FOR VISIT:  Follow-up for rectosigmoid cancer  PRIOR THERAPY: none  NGS Results: not done  CURRENT THERAPY: Xeloda  BRIEF ONCOLOGIC HISTORY:  Oncology History  Cecal cancer ([place])  07/31/2021 Cancer Staging   Staging form: Colon and Rectum, AJCC 8th Edition - Clinical stage from 07/31/2021: Stage IIA (cT3(2), cN0, cM0) - Signed by Derwood Kaplan, MD on 09/01/2021 Histopathologic type: Adenocarcinoma, NOS Stage prefix: Initial diagnosis Total positive nodes: 0 Total nodes examined: 35 Histologic grade (G): G2 Histologic grading system: 4 grade system Laterality: Right Bilateral cancer: Yes Tumor size (mm): 74 Multiple tumors: Yes Number of tumors: 2 Lymph-vascular invasion (LVI): LVI not present (absent)/not identified Diagnostic confirmation: Positive histology Specimen type: Excision Staged by: Managing physician Tumor deposits (TD): Absent Perineural invasion (PNI): Absent Microsatellite instability (MSI): Stable KRAS mutation: Not assessed NRAS mutation: Not assessed BRAF mutation: Not assessed Stage used in treatment planning: Yes National guidelines used in treatment planning: Yes Type of national guideline used in treatment planning: NCCN   08/28/2021 Initial Diagnosis   Cecal cancer ([place])   Cancer of rectosigmoid (colon) ([place])  07/31/2021 Initial Diagnosis   Cancer of rectosigmoid (colon) ([place])   07/31/2021 Cancer Staging   Staging form: Colon and Rectum, AJCC 8th Edition - Clinical stage from 07/31/2021: Stage IIB (cT4a(2), cN0, cM0) - Signed by Derwood Kaplan, MD on 09/01/2021 Histopathologic type: Adenocarcinoma, NOS Stage prefix: Initial diagnosis Total positive nodes: 0 Total nodes examined: 35 Histologic grade (G): G2 Histologic grading system: 4  grade system Laterality: Left Bilateral cancer: Yes Tumor size (mm): 83 Multiple tumors: Yes Number of tumors: 2 Lymph-vascular invasion (LVI): LVI not present (absent)/not identified Diagnostic confirmation: Positive histology Specimen type: Excision Staged by: Managing physician Tumor deposits (TD): Absent Carcinoembryonic antigen (CEA) (ng/mL): 1.6 Perineural invasion (PNI): Absent Microsatellite instability (MSI): Stable KRAS mutation: Not assessed NRAS mutation: Not assessed BRAF mutation: Not assessed Stage used in treatment planning: Yes National guidelines used in treatment planning: Yes Type of national guideline used in treatment planning: NCCN   09/12/2021 - 09/12/2021 Chemotherapy   Patient is on Treatment Plan : COLORECTAL Capecitabine q21d       CANCER STAGING:  Cancer Staging  Cancer of rectosigmoid (colon) ([place]) Staging form: Colon and Rectum, AJCC 8th Edition - Clinical stage from 07/31/2021: Stage IIB (cT4a(2), cN0, cM0) - Signed by Derwood Kaplan, MD on 09/01/2021  Cecal cancer [facility]) Staging form: Colon and Rectum, AJCC 8th Edition - Clinical stage from 07/31/2021: Stage IIA (cT3(2), cN0, cM0) - Signed by Derwood Kaplan, MD on 09/01/2021   INTERVAL HISTORY:  Mr. ALY HAUSER, a 66 y.o. male, seen for follow-up of rectosigmoid cancer.  Last dose of Xeloda was on 03/25/2022.  Reports problems and left foot sole with mild soreness and skin peeling.  Reports his stool is too hard in the ostomy bag.  He is taking Imodium 2 tablets 4 times daily.  Denies any abdominal pains.  REVIEW OF SYSTEMS:  Review of Systems  Constitutional:  Negative for appetite change.  HENT:   Negative for mouth sores.   Gastrointestinal:  Negative for abdominal pain.  Skin:  Negative for rash.  Neurological:  Negative for dizziness.  All other systems reviewed and are negative.   PAST MEDICAL/SURGICAL HISTORY:  Past Medical  History:  Diagnosis Date   Arthritis    Asthma     Vertigo    Past Surgical History:  Procedure Laterality Date   BIOPSY  07/31/2021   Procedure: BIOPSY;  Surgeon: Eloise Harman, DO;  [facility];  Service: Endoscopy;;   CATARACT EXTRACTION Left    COLON RESECTION N/A 08/02/2021   Procedure: En bloc Resection of Colon and Small bowel;  Surgeon: Virl Cagey, MD;  Location: [facility] ORS;  Service: General;  Laterality: N/A;   COLOSTOMY Right 08/02/2021   Procedure: ILEOSTOMY;  Surgeon: Virl Cagey, MD;  Location: [facility] ORS;  Service: General;  Laterality: Right;   FLEXIBLE SIGMOIDOSCOPY  07/31/2021   Procedure: FLEXIBLE SIGMOIDOSCOPY;  Surgeon: Eloise Harman, DO;  Location: [facility];  Service: Endoscopy;;   SUBMUCOSAL TATTOO INJECTION  07/31/2021   Procedure: SUBMUCOSAL TATTOO INJECTION;  Surgeon: Eloise Harman, DO;  Location[facility];  Service: Endoscopy;;   SUBTOTAL COLECTOMY     end ileostomy; sigmoid and cecal cancers    SOCIAL HISTORY:  Social History   Socioeconomic History   Marital status: Single    Spouse name: Not on file   Number of children: 0   Years of education: 12+1   Highest education level: Some college, no degree  Occupational History   Not on file  Tobacco Use   Smoking status: Never   Smokeless tobacco: Never  Vaping Use   Vaping Use: Never used  Substance and Sexual Activity   Alcohol use: Not Currently    Alcohol/week: 4.0 standard drinks of alcohol    Types: 4 Cans of beer per week    Comment: every day-none in 6 months or more   Drug use: Never   Sexual activity: Not Currently  Other Topics Concern   Not on file  Social History Narrative   Not on file   Social Determinants of Health   Financial Resource Strain: Not on file  Food Insecurity: Not on file  Transportation Needs: Not on file  Physical Activity: Not on file  Stress: No Stress Concern Present (08/31/2021)   [place]    Feeling of  Stress : Not at all  Social Connections: Not on file  Intimate Partner Violence: Not At Risk (08/31/2021)   Humiliation, Afraid, Rape, and Kick questionnaire    Fear of Current or Ex-Partner: No    Emotionally Abused: No    Physically Abused: No    Sexually Abused: No    FAMILY HISTORY:  Family History  Problem Relation Age of Onset   Hypertension Mother    Diabetes Mother    Parkinson's disease Father    Dementia Father     CURRENT MEDICATIONS:  Current Outpatient Medications  Medication Sig Dispense Refill   capecitabine (XELODA) 500 MG tablet Take 3 tablets (1,500 mg total) by mouth 2 (two) times daily after a meal. Take for 14 days on, 7 days off. Repeat every 21 days. 84 tablet 5   psyllium (HYDROCIL/METAMUCIL) 95 % PACK Take 1 packet by mouth daily. 240 each 2   meloxicam (MOBIC) 7.5 MG tablet Take 1 tablet (7.5 mg total) by mouth daily. 30 tablet 3   traMADol (ULTRAM) 50 MG tablet Take 1 tablet (50 mg total) by mouth daily as needed. (Patient not taking: Reported on 04/03/2022) 30 tablet 0   No current facility-administered medications for this visit.    ALLERGIES:  Allergies  Allergen  Reactions   Apple Anaphylaxis    Other reaction(s): Unknown   Apple Juice Anaphylaxis    Throat swelling     PHYSICAL EXAM:  Performance status (ECOG): 1 - Symptomatic but completely ambulatory  Vitals:   04/03/22 1135  BP: 122/87  Pulse: 76  Resp: 18  Temp: 98.5 F (36.9 C)  SpO2: 100%   Wt Readings from Last 3 Encounters:  04/03/22 165 lb 3.2 oz (74.9 kg)  02/20/22 162 lb 9.6 oz (73.8 kg)  01/02/22 163 lb 14.4 oz (74.3 kg)   Physical Exam Vitals reviewed.  Constitutional:      Appearance: Normal appearance.  Cardiovascular:     Rate and Rhythm: Normal rate and regular rhythm.     Pulses: Normal pulses.     Heart sounds: Normal heart sounds.  Pulmonary:     Effort: Pulmonary effort is normal.     Breath sounds: Normal breath sounds.  Neurological:     General:  No focal deficit present.     Mental Status: He is alert and oriented to person, place, and time.  Psychiatric:        Mood and Affect: Mood normal.        Behavior: Behavior normal.      LABORATORY DATA:  I have reviewed the labs as listed.     Latest Ref Rng & Units 03/27/2022    9:28 AM 02/18/2022   11:14 AM 01/01/2022   11:09 AM  CBC  WBC 4.0 - 10.5 K/uL 5.0  6.0  5.1   Hemoglobin 13.0 - 17.0 g/dL 10.8  10.7  9.6   Hematocrit 39.0 - 52.0 % 30.9  31.2  28.5   Platelets 150 - 400 K/uL 276  226  264       Latest Ref Rng & Units 03/27/2022    9:28 AM 02/18/2022   11:14 AM 01/01/2022   11:09 AM  CMP  Glucose 70 - 99 mg/dL 93  103  109   BUN 8 - 23 mg/dL _0 Creatinine 0.61 - 1.24 mg/dL 1.41  1.38  1.58   Sodium 135 - 145 mmol/L 138  137  140   Potassium 3.5 - 5.1 mmol/L 4.1  3.7  3.8   Chloride 98 - 111 mmol/L 103  105  112   CO2 22 - 32 mmol/L _1 Calcium 8.9 - 10.3 mg/dL 9.9  9.4  9.4   Total Protein 6.5 - 8.1 g/dL 7.3  7.2  6.9   Total Bilirubin 0.3 - 1.2 mg/dL 0.7  1.2  0.8   Alkaline Phos 38 - 126 U/L 68  74  74   AST 15 - 41 U/L _2 ALT 0 - 44 U/L _3 DIAGNOSTIC IMAGING:  I have independently reviewed the scans and discussed with the patient. CT Abdomen Pelvis W Contrast  Result Date: 03/27/2022 CLINICAL DATA:  Follow-up colon and rectal carcinomas. Ongoing chemotherapy. Assess treatment response. EXAM: CT ABDOMEN AND PELVIS WITH CONTRAST TECHNIQUE: Multidetector CT imaging of the abdomen and pelvis was performed using the standard protocol following bolus administration of intravenous contrast. RADIATION DOSE REDUCTION: This exam was performed according to the departmental dose-optimization program which includes automated exposure control, adjustment of the mA and/or kV according to patient size and/or use of iterative reconstruction technique. CONTRAST:  14m OMNIPAQUE IOHEXOL 300 MG/ML  SOLN COMPARISON:  08/11/2021 from  [place]: Lower Chest: No acute findings. Hepatobiliary: No hepatic masses identified. Gallbladder is unremarkable. No evidence of biliary ductal dilatation. Pancreas:  No mass or inflammatory changes. Spleen: Within normal limits in size and appearance. Adrenals/Urinary Tract: No suspicious masses identified. No evidence of ureteral calculi or hydronephrosis. Stomach/Bowel: Postop changes are seen from subtotal colectomy with Hartmann's pouch and right lower quadrant ileostomy. A peristomal hernia is seen containing multiple small bowel loops, however there is no evidence of bowel obstruction or strangulation. No soft tissue masses are identified. No evidence of obstruction, inflammatory process or abnormal fluid collections. Vascular/Lymphatic: Multiple small lymph nodes are seen in the sigmoid mesentery measuring up to 8 mm on image 55/2. This area was not well visualized on previous study due to postop changes and ascites. No pathologically enlarged lymph nodes identified within the pelvis or abdomen. No acute vascular findings. Aortic atherosclerotic calcification incidentally noted. Reproductive:  No mass or other significant abnormality. Other:  None. Musculoskeletal: No suspicious bone lesions identified. Severe right hip osteoarthritis again demonstrated. IMPRESSION: Postop changes from subtotal colectomy and right lower quadrant ileostomy. Peristomal hernia contains several small bowel loops, without evidence of bowel obstruction or strangulation. Multiple small lymph nodes in sigmoid mesentery measuring up to 8 mm. Metastatic disease cannot be excluded. Consider continued follow-up by CT or further assessment with PET-CT. No other sites of metastatic disease identified. Aortic Atherosclerosis (ICD10-I70.0). Electronically Signed   By: Marlaine Hind M.D.   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  Total examined lymph nodes 35.  MMR-preserved. - CT CAP on 07/31/2021 with no evidence of metastatic disease. - Xeloda 1500 mg twice daily 2 weeks on/1 week off in the adjuvant setting cycle 1 started on 09/05/2021.  Cycle 4 from 11/09/2021 through 11/22/2021, Xeloda held after that for hand-foot skin reaction.  Xeloda dose decreased to 1000 mg twice daily for cycle 5 to start on 01/03/2022    Social/family history: - He lives by himself.  He is a retired Clinical biochemist.  No exposure to asbestos.  Non-smoker. - Father had colon cancer in his 19s.  Maternal uncle had colon cancer.   PLAN:  T4N0 sigmoid colon cancer: -He has completed 8 cycles of adjuvant Xeloda, last tablet on 03/25/2022. - Reviewed labs today which showed normal LFTs.  CBC was grossly normal.  Ferritin was 93 and percent saturation 26.  CEA was 1.5. - CTA PE (03/27/2022): Postop changes from colectomy.  Peristomal hernia present.  Multiple small lymph nodes in the sigmoid mesentery measuring up to 8 mm. - Recommend follow-up in 4 months with repeat CTAP with contrast. - He reports stool is hard in the colostomy bag.  He is taking Imodium 2 tablets 4 times daily.  I have told him to decrease it to 2 tablets 3 times daily.  2.  Grade 2 HFSR: - His last dose of Xeloda was on 03/25/2022. - He has peeling of the skin of the palms and left foot sole.  Continue moisturizing lotion twice daily.  No blistering noted.  Mild soreness but no erythema.   3.  Nausea: - Continue Compazine every 6 hours as needed.  4.  Knee pain: - Continue tramadol half tablet as needed. - He may start back on Mobic 7.5  mg daily.    Orders placed this encounter:  Orders Placed This Encounter  Procedures   CT Abdomen Pelvis W Contrast   CBC with Differential   Comprehensive metabolic panel   Magnesium   CEA       Derek Jack, MD [place] [phone]

## 2022-04-03 NOTE — Patient Instructions (Addendum)
[place] at [facility] Discharge Instructions   You were seen and examined today by Dr. Delton Coombes.  He reviewed the results of your lab work which are stable/normal.   He reviewed the results of your CT scan which is stable.   We will see you back in 4 months. We will repeat lab work and a CT scan prior to this visit.    Thank you for choosing [place] at [facility] to provide your oncology and hematology care.  To afford each patient quality time with our provider, please arrive at least 15 minutes before your scheduled appointment time.   If you have a lab appointment with the [facility] please come in thru the Main Entrance and check in at the main information desk.  You need to re-schedule your appointment should you arrive 10 or more minutes late.  We strive to give you quality time with our providers, and arriving late affects you and other patients whose appointments are after yours.  Also, if you no show three or more times for appointments you may be dismissed from the clinic at the providers discretion.     Again, thank you for choosing [facility].  Our hope is that these requests will decrease the amount of time that you wait before being seen by our physicians.       _____________________________________________________________  Should you have questions after your visit to [facility], please contact our office at [phone] and follow the prompts.  Our office hours are 8:00 a.m. and 4:30 p.m. Monday - Friday.  Please note that voicemails left after 4:00 p.m. may not be returned until the following business day.  We are closed weekends and major holidays.  You do have access to a nurse 24-7, just call the main number to the clinic [phone] and do not press any options, hold on the line and a nurse will answer the phone.    For prescription refill requests, have your pharmacy contact our  office and allow 72 hours.    Due to Covid, you will need to wear a mask upon entering the hospital. If you do not have a mask, a mask will be given to you at the Main Entrance upon arrival. For doctor visits, patients may have 1 support person age 66 or older with them. For treatment visits, patients can not have anyone with them due to social distancing guidelines and our immunocompromised population.

## 2022-05-08 DIAGNOSIS — H269 Unspecified cataract: Secondary | ICD-10-CM | POA: Diagnosis not present

## 2022-05-08 DIAGNOSIS — H25811 Combined forms of age-related cataract, right eye: Secondary | ICD-10-CM | POA: Diagnosis not present

## 2022-05-08 DIAGNOSIS — H2511 Age-related nuclear cataract, right eye: Secondary | ICD-10-CM | POA: Diagnosis not present

## 2022-06-27 ENCOUNTER — Ambulatory Visit: Payer: Medicare Other | Admitting: General Surgery

## 2022-06-27 ENCOUNTER — Other Ambulatory Visit: Payer: Self-pay | Admitting: *Deleted

## 2022-06-27 ENCOUNTER — Encounter: Payer: Self-pay | Admitting: General Surgery

## 2022-06-27 VITALS — BP 160/81 | HR 93 | Temp 98.2°F | Resp 16 | Ht 67.0 in | Wt 173.0 lb

## 2022-06-27 DIAGNOSIS — Z932 Ileostomy status: Secondary | ICD-10-CM | POA: Diagnosis not present

## 2022-06-27 DIAGNOSIS — K435 Parastomal hernia without obstruction or  gangrene: Secondary | ICD-10-CM | POA: Diagnosis not present

## 2022-06-27 NOTE — Patient Instructions (Signed)
Will talk to Dr. Delton Coombes about your cancer and CT scan for April and how he feels about your cancer status. If he thinks you are good at that time, will need a colonoscopy to look at your stump to verify no issues and will consider reversal.   Will refer you to the Ostomy Rns to get them to  size you for potential precut wafers and they can discuss the pros and cons of those wafers.

## 2022-06-27 NOTE — Progress Notes (Signed)
[facility] Note   HPI:  67 y.o. Male presents to clinic for follow-up evaluation of his ileostomy. He had a subtotal colectomy for a cecal and sigmoid cancer. [place] this [place] staying with his nephew for a while. He is gaining weight and feeling better. He follows with Dr. Delton Coombes and his last CT was 02/2022 which showed some post operative nodes/ changes but nothing that was felt to be metastatic disease.   He is interested in changing to a wafer that is precut.   He uses imodium due to him having an ileostomy and being at risk of dehydration. He was over using imodium and has since backed down and is having softer stool from his bag.   Review of Systems:  No pain Regular output ostomy All other review of systems: otherwise negative   Vital Signs:  BP (!) 160/81   Pulse 93   Temp 98.2 F (36.8 C) (Oral)   Resp 16   Ht '5\' 7"'$  (1.702 m)   Wt 173 lb (78.5 kg)   SpO2 97%   BMI 27.10 kg/m    Physical Exam:  Physical Exam Vitals reviewed.  Cardiovascular:     Rate and Rhythm: Normal rate.  Pulmonary:     Effort: Pulmonary effort is normal.  Abdominal:     General: There is no distension.     Palpations: Abdomen is soft.     Tenderness: There is no abdominal tenderness.     Comments: Midline healed, ostomy pink with minor prolapse about 1-2cm out from the skin  Neurological:     Mental Status: He is alert.      Assessment:  67 y.o. 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 He was on Xeloda but is off and is due to see Dr. Delton Coombes in April. He is unsure if he will want to be reversed or not. We can discuss this option after his next CT and discuss getting a flex sig to look at his stump to ensure the length and ensure no issues or recurrences at the staple line before proceeding.   Plan:   Will talk to Dr. Delton Coombes about your cancer and CT scan for April and how he feels about your cancer status. If he thinks you  are good at that time, will need a colonoscopy to look at your stump to verify no issues and will consider reversal.   Will refer you to the Ostomy R[facility] to get them to  size you for potential precut wafers and they can discuss the pros and cons of those wafers.  Future Appointments  Date Time Provider [place]  07/31/2022  8:30 AM [facility] LAB [facility] None  07/31/2022 10:00 AM AP-CT 1 [facility]-CT [place] [facility]  08/05/2022 11:45 AM Derek Jack, MD [facility] None  08/22/2022  1:45 PM Virl Cagey, MD [facility]-RS None    Curlene Labrum, MD [facility] [address] [place], [place] [address] [phone] (office)

## 2022-07-25 ENCOUNTER — Other Ambulatory Visit: Payer: Self-pay | Admitting: Hematology

## 2022-07-25 DIAGNOSIS — M170 Bilateral primary osteoarthritis of knee: Secondary | ICD-10-CM

## 2022-07-31 ENCOUNTER — Ambulatory Visit (HOSPITAL_COMMUNITY)
Admission: RE | Admit: 2022-07-31 | Discharge: 2022-07-31 | Disposition: A | Discharge status: Home / Self Care | Payer: Medicare Other | Source: Ambulatory Visit | Attending: Hematology | Admitting: Hematology

## 2022-07-31 ENCOUNTER — Inpatient Hospital Stay: Payer: Medicare Other | Attending: Hematology

## 2022-07-31 DIAGNOSIS — C189 Malignant neoplasm of colon, unspecified: Secondary | ICD-10-CM | POA: Diagnosis not present

## 2022-07-31 DIAGNOSIS — D631 Anemia in chronic kidney disease: Secondary | ICD-10-CM | POA: Insufficient documentation

## 2022-07-31 DIAGNOSIS — D500 Iron deficiency anemia secondary to blood loss (chronic): Secondary | ICD-10-CM | POA: Insufficient documentation

## 2022-07-31 DIAGNOSIS — N189 Chronic kidney disease, unspecified: Secondary | ICD-10-CM | POA: Insufficient documentation

## 2022-07-31 DIAGNOSIS — Z85038 Personal history of other malignant neoplasm of large intestine: Secondary | ICD-10-CM | POA: Insufficient documentation

## 2022-07-31 DIAGNOSIS — Z800 Family history of malignant neoplasm of digestive organs: Secondary | ICD-10-CM | POA: Insufficient documentation

## 2022-07-31 DIAGNOSIS — C19 Malignant neoplasm of rectosigmoid junction: Secondary | ICD-10-CM | POA: Insufficient documentation

## 2022-07-31 DIAGNOSIS — C20 Malignant neoplasm of rectum: Secondary | ICD-10-CM | POA: Diagnosis not present

## 2022-07-31 LAB — MAGNESIUM: Magnesium: 2.2 mg/dL (ref 1.7–2.4)

## 2022-07-31 LAB — COMPREHENSIVE METABOLIC PANEL
ALT: 24 U/L (ref 0–44)
AST: 26 U/L (ref 15–41)
Albumin: 3.7 g/dL (ref 3.5–5.0)
Alkaline Phosphatase: 100 U/L (ref 38–126)
Anion gap: 9 (ref 5–15)
BUN: 22 mg/dL (ref 8–23)
CO2: 23 mmol/L (ref 22–32)
Calcium: 9.1 mg/dL (ref 8.9–10.3)
Chloride: 105 mmol/L (ref 98–111)
Creatinine, Ser: 1.32 mg/dL — ABNORMAL HIGH (ref 0.61–1.24)
GFR, Estimated: 60 mL/min — ABNORMAL LOW (ref >60)
Glucose, Bld: 130 mg/dL — ABNORMAL HIGH (ref 70–99)
Potassium: 3.9 mmol/L (ref 3.5–5.1)
Sodium: 137 mmol/L (ref 135–145)
Total Bilirubin: 0.6 mg/dL (ref 0.3–1.2)
Total Protein: 7.2 g/dL (ref 6.5–8.1)

## 2022-07-31 LAB — CBC WITH DIFFERENTIAL/PLATELET
Abs Immature Granulocytes: 0.02 10*3/uL (ref 0.00–0.07)
Basophils Absolute: 0.1 10*3/uL (ref 0.0–0.1)
Basophils Relative: 1 %
Eosinophils Absolute: 0.5 10*3/uL (ref 0.0–0.5)
Eosinophils Relative: 7 %
HCT: 32.1 % — ABNORMAL LOW (ref 39.0–52.0)
Hemoglobin: 10.7 g/dL — ABNORMAL LOW (ref 13.0–17.0)
Immature Granulocytes: 0 %
Lymphocytes Relative: 13 %
Lymphs Abs: 0.9 10*3/uL (ref 0.7–4.0)
MCH: 33.6 pg (ref 26.0–34.0)
MCHC: 33.3 g/dL (ref 30.0–36.0)
MCV: 100.9 fL — ABNORMAL HIGH (ref 80.0–100.0)
Monocytes Absolute: 0.6 10*3/uL (ref 0.1–1.0)
Monocytes Relative: 8 %
Neutro Abs: 5.1 10*3/uL (ref 1.7–7.7)
Neutrophils Relative %: 71 %
Platelets: 314 10*3/uL (ref 150–400)
RBC: 3.18 MIL/uL — ABNORMAL LOW (ref 4.22–5.81)
RDW: 12.7 % (ref 11.5–15.5)
WBC: 7.1 10*3/uL (ref 4.0–10.5)
nRBC: 0 % (ref 0.0–0.2)

## 2022-07-31 MED ORDER — IOHEXOL 9 MG/ML PO SOLN
ORAL | Status: AC
  Filled 2022-07-31: qty 1000

## 2022-07-31 MED ORDER — IOHEXOL 300 MG/ML  SOLN
100.0000 mL | Freq: Once | INTRAMUSCULAR | Status: AC | PRN
  Administered 2022-07-31: 100 mL via INTRAVENOUS

## 2022-08-02 LAB — CEA: CEA: 1.4 ng/mL (ref 0.0–4.7)

## 2022-08-05 ENCOUNTER — Inpatient Hospital Stay: Payer: Medicare Other | Admitting: Hematology

## 2022-08-05 VITALS — BP 131/89 | HR 85 | Temp 98.0°F | Resp 16 | Wt 178.0 lb

## 2022-08-05 DIAGNOSIS — C19 Malignant neoplasm of rectosigmoid junction: Secondary | ICD-10-CM | POA: Diagnosis not present

## 2022-08-05 DIAGNOSIS — Z800 Family history of malignant neoplasm of digestive organs: Secondary | ICD-10-CM | POA: Diagnosis not present

## 2022-08-05 DIAGNOSIS — D631 Anemia in chronic kidney disease: Secondary | ICD-10-CM | POA: Diagnosis not present

## 2022-08-05 DIAGNOSIS — N189 Chronic kidney disease, unspecified: Secondary | ICD-10-CM | POA: Diagnosis not present

## 2022-08-05 DIAGNOSIS — D500 Iron deficiency anemia secondary to blood loss (chronic): Secondary | ICD-10-CM | POA: Diagnosis not present

## 2022-08-05 NOTE — Patient Instructions (Signed)
[facility] at [facility] Discharge Instructions   You were seen and examined today by Dr. Ellin Saba.  He reviewed the results of your lab work which are normal/stable. Your hemoglobin and iron are low. We will plan to give you IV iron in the clinic. This will help improve your hemoglobin and your energy.   He reviewed the results of your CT scan which was normal/stable. There is no sign of cancer detected on this exam.   We will see you back in    Thank you for choosing [facility] at [facility] to provide your oncology and hematology care.  To afford each patient quality time with our provider, please arrive at least 15 minutes before your scheduled appointment time.   If you have a lab appointment with the Cancer Center please come in thru the Main Entrance and check in at the main information desk.  You need to re-schedule your appointment should you arrive 10 or more minutes late.  We strive to give you quality time with our providers, and arriving late affects you and other patients whose appointments are after yours.  Also, if you no show three or more times for appointments you may be dismissed from the clinic at the providers discretion.     Again, thank you for choosing [facility].  Our hope is that these requests will decrease the amount of time that you wait before being seen by our physicians.       _____________________________________________________________  Should you have questions after your visit to [facility], please contact our office at [phone] and follow the prompts.  Our office hours are 8:00 a.m. and 4:30 p.m. Monday - Friday.  Please note that voicemails left after 4:00 p.m. may not be returned until the following business day.  We are closed weekends and major holidays.  You do have access to a nurse 24-7, just call the main number to the clinic [phone] and do not press any options,  hold on the line and a nurse will answer the phone.    For prescription refill requests, have your pharmacy contact our office and allow 72 hours.    Due to Covid, you will need to wear a mask upon entering the hospital. If you do not have a mask, a mask will be given to you at the Main Entrance upon arrival. For doctor visits, patients may have 1 support person age 8 or older with them. For treatment visits, patients can not have anyone with them due to social distancing guidelines and our immunocompromised population.

## 2022-08-05 NOTE — Progress Notes (Signed)
[facility] 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  Total examined lymph nodes 35.  MMR-preserved. - CT CAP on 07/31/2021 with no evidence of metastatic disease. - Xeloda in the adjuvant setting from 09/05/2021 through 03/25/2022    Social/family history: - He lives by himself.  He is a retired Personnel officer.  No exposure to asbestos.  Non-smoker. - Father had colon cancer in his 81s.  Maternal uncle had colon cancer.  Plan: T4N0 sigmoid colon cancer: - He denies any new onset pains.  Bowel habits are normal. - CT [facility] on 07/31/2022: No evidence of recurrence or metastatic disease.  Other benign findings were explained. - Labs CEA 1.4.  LFTs are normal.  Creatinine is 1.32 and stable. - No evidence of recurrence at this time.  RTC 3 months with CEA and LFTs.  CTAP will be repeated in 6 months.  2.  Macrocytic anemia: - Hemoglobin 10.7 with MCV 100.9.  Previous ferritin is 93. 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 - Recommend Feraheme x 2.    No orders of the defined types were placed in this encounter.     I,Alexis Herring,acting as a Neurosurgeon for [employer], MD.,have documented all relevant documentation on the behalf of Doreatha Massed, MD,as directed by  Doreatha Massed, MD while in the presence of Doreatha Massed, MD.   I,  Doreatha Massed MD, have reviewed the above documentation for accuracy and completeness, and I agree with the above.   Doreatha Massed, MD   4/8/20247:25 PM  CHIEF COMPLAINT:   Diagnosis: rectosigmoid cancer    Cancer Staging  Cancer of rectosigmoid (colon) Staging form: Colon and Rectum, AJCC 8th Edition - Clinical stage from 07/31/2021: Stage IIB (cT4a(2), cN0, cM0) - Signed by Dellia Beckwith, MD on 09/01/2021  Cecal cancer Staging form: Colon and Rectum, AJCC 8th Edition - Clinical stage from 07/31/2021: Stage IIA (cT3(2), cN0, cM0) - Signed by Dellia Beckwith, MD on 09/01/2021    Prior Therapy: none  Current Therapy:  Xeloda   HISTORY OF PRESENT ILLNESS:   Oncology History  Cecal cancer  07/31/2021 Cancer Staging   Staging form: Colon and Rectum, AJCC 8th Edition - Clinical stage from 07/31/2021: Stage IIA (cT3(2), cN0, cM0) - Signed by Dellia Beckwith, MD on 09/01/2021 Histopathologic type: Adenocarcinoma, NOS Stage prefix: Initial diagnosis Total positive nodes: 0 Total nodes examined: 35 Histologic grade (G): G2 Histologic grading system: 4 grade system Laterality: Right Bilateral cancer: Yes Tumor size (mm): 74 Multiple tumors: Yes Number of tumors: 2 Lymph-vascular invasion (LVI): LVI not present (absent)/not identified Diagnostic confirmation: Positive histology Specimen type: Excision Staged by: Managing physician Tumor deposits (TD): Absent Perineural invasion (PNI): Absent Microsatellite instability (MSI): Stable KRAS mutation: Not assessed NRAS mutation: Not assessed BRAF mutation: Not assessed Stage used in treatment planning: Yes National guidelines used in treatment planning: Yes Type of national guideline used in treatment planning: NCCN  08/28/2021 Initial Diagnosis   Cecal cancer (HCC)   Cancer of rectosigmoid (colon)  07/31/2021 Initial Diagnosis   Cancer of rectosigmoid (colon) (HCC)   07/31/2021 Cancer Staging   Staging form:  Colon and Rectum, AJCC 8th Edition - Clinical stage from 07/31/2021: Stage IIB (cT4a(2), cN0, cM0) - Signed by Dellia Beckwith, MD on 09/01/2021 Histopathologic type: Adenocarcinoma, NOS Stage prefix: Initial diagnosis Total positive nodes: 0 Total nodes examined: 35 Histologic grade (G): G2 Histologic grading system: 4 grade system Laterality: Left Bilateral cancer: Yes Tumor size (mm): 83 Multiple tumors: Yes Number of tumors: 2 Lymph-vascular invasion (LVI): LVI not present (absent)/not identified Diagnostic confirmation: Positive histology Specimen type: Excision Staged by: Managing physician Tumor deposits (TD): Absent Carcinoembryonic antigen (CEA) (ng/mL): 1.6 Perineural invasion (PNI): Absent Microsatellite instability (MSI): Stable KRAS mutation: Not assessed NRAS mutation: Not assessed BRAF mutation: Not assessed Stage used in treatment planning: Yes National guidelines used in treatment planning: Yes Type of national guideline used in treatment planning: NCCN   09/12/2021 - 09/12/2021 Chemotherapy   Patient is on Treatment Plan : COLORECTAL Capecitabine q21d        INTERVAL HISTORY:   Todd Richardson is a 67 y.o. male presenting to clinic today for follow up of rectosigmoid cancer. He was last seen by me on 04/03/22.  Today, he states that he is doing okay overall. His appetite level is at 100%. His energy level is at 30%. He states that he stays tired and cold. He does not remember ever receiving IV iron in the past. He previously took a women's multivitamin with iron in the past but has not taken it in a while. He reports that his bowel movements have been soft, solid for the most part but may be watery 25% of the time. He takes Imodium with improvement when needed. He denies any blood in his stools. He reports good healing of his surgical incision. He takes Mobic daily.  PAST MEDICAL HISTORY:   Past Medical History: Past Medical History:  Diagnosis Date   Arthritis     Asthma    Vertigo     Surgical History: Past Surgical History:  Procedure Laterality Date   BIOPSY  07/31/2021   Procedure: BIOPSY;  Surgeon: Lanelle Bal, DO;  [facility];  Service: Endoscopy;;   CATARACT EXTRACTION Left    COLON RESECTION N/A 08/02/2021   Procedure: En bloc Resection of Colon and Small bowel;  Surgeon: Lucretia Roers, MD;  Location: [facility] ORS;  Service: General;  Laterality: N/A;   COLOSTOMY Right 08/02/2021   Procedure: ILEOSTOMY;  Surgeon: Lucretia Roers, MD;  Location: [facility] ORS;  Service: General;  Laterality: Right;   FLEXIBLE SIGMOIDOSCOPY  07/31/2021   Procedure: FLEXIBLE SIGMOIDOSCOPY;  Surgeon: Lanelle Bal, DO;  Location: [facility];  Service: Endoscopy;;   SUBMUCOSAL TATTOO INJECTION  07/31/2021   Procedure: SUBMUCOSAL TATTOO INJECTION;  Surgeon: Lanelle Bal, DO;  Location: [facility];  Service: Endoscopy;;   SUBTOTAL COLECTOMY     end ileostomy; sigmoid and cecal cancers    Social History: Social History   Socioeconomic History   Marital status: Single    Spouse name: Not on file   Number of children: 0   Years of education: 12+1   Highest education level: Some college, no degree  Occupational History   Not on file  Tobacco Use   Smoking status: Never   Smokeless tobacco: Never  Vaping Use   Vaping Use: Never used  Substance and Sexual Activity   Alcohol use: Not Currently    Alcohol/week: 4.0 standard drinks of alcohol    Types: 4 Cans of beer per week    Comment: every day-none in 6 months or more   Drug use: Never   Sexual activity: Not Currently  Other Topics Concern   Not on file  Social History Narrative   Not on file   Social Determinants of Health   Financial Resource Strain: Not on file  Food Insecurity: Not on file  Transportation Needs: Not on file  Physical Activity: Not on file  Stress: No Stress Concern Present (08/31/2021)   [employer] of Occupational Health - Occupational Stress  Questionnaire    Feeling of Stress : Not at all  Social Connections: Not on file  Intimate Partner Violence: Not At Risk (08/31/2021)   Humiliation, Afraid, Rape, and Kick questionnaire    Fear of Current or Ex-Partner: No    Emotionally Abused: No    Physically Abused: No    Sexually Abused: No    Family History: Family History  Problem Relation Age of Onset   Hypertension Mother    Diabetes Mother    Parkinson's disease Father    Dementia Father     Current Medications:  Current Outpatient Medications:    diphenhydrAMINE (BENADRYL) 25 mg capsule, Take 25 mg by mouth as needed., Disp: , Rfl:    Loperamide HCl (IMODIUM PO), Take 1 tablet by mouth 4 (four) times daily., Disp: , Rfl:    meloxicam (MOBIC) 7.5 MG tablet, Take 1 tablet by mouth once daily, Disp: 30 tablet, Rfl: 0   psyllium (HYDROCIL/METAMUCIL) 95 % PACK, Take 1 packet by mouth daily., Disp: 240 each, Rfl: 2   traMADol (ULTRAM) 50 MG tablet, Take 1 tablet (50 mg total) by mouth daily as needed. (Patient not taking: Reported on 08/05/2022), Disp: 30 tablet, Rfl: 0   Allergies: Allergies  Allergen Reactions   Apple Anaphylaxis    Other reaction(s): Unknown   Apple Juice Anaphylaxis    Throat swelling     REVIEW OF SYSTEMS:   Review of Systems  Constitutional:  Positive for fatigue. Negative for chills and fever.  HENT:   Negative for lump/mass, mouth sores, nosebleeds, sore throat and trouble swallowing.   Eyes:  Negative for eye problems.  Respiratory:  Negative for cough and shortness of breath.   Cardiovascular:  Negative for chest pain, leg swelling and palpitations.  Gastrointestinal:  Positive for diarrhea and nausea. Negative for abdominal pain, constipation and vomiting.  Genitourinary:  Negative for bladder incontinence, difficulty urinating, dysuria, frequency, hematuria and nocturia.   Musculoskeletal:  Negative for arthralgias, back pain, flank pain, myalgias and neck pain.  Skin:  Negative for  itching and rash.  Neurological:  Negative for dizziness, headaches and numbness.  Hematological:  Does not bruise/bleed easily.  Psychiatric/Behavioral:  Negative for depression, sleep disturbance and suicidal ideas. The patient is not nervous/anxious.   All other systems reviewed and are negative.    VITALS:   Blood pressure 131/89, pulse 85, temperature 98 F (36.7 C), temperature source Oral, resp. rate 16, weight 178 lb (80.7 kg), SpO2 100 %.  Wt Readings from Last 3 Encounters:  08/05/22 178 lb (80.7 kg)  06/27/22 173 lb (78.5 kg)  04/03/22 165 lb 3.2 oz (74.9 kg)    Body mass index is 27.88 kg/m.  Performance status (ECOG): 1 - Symptomatic but completely ambulatory  PHYSICAL EXAM:   Physical Exam Vitals and  nursing note reviewed. Exam conducted with a chaperone present.  Constitutional:      Appearance: Normal appearance.  Cardiovascular:     Rate and Rhythm: Normal rate and regular rhythm.     Pulses: Normal pulses.     Heart sounds: Normal heart sounds.  Pulmonary:     Effort: Pulmonary effort is normal.     Breath sounds: Normal breath sounds.  Abdominal:     Palpations: Abdomen is soft. There is no hepatomegaly, splenomegaly or mass.     Tenderness: There is no abdominal tenderness.  Musculoskeletal:     Right lower leg: No edema.     Left lower leg: No edema.  Lymphadenopathy:     Cervical: No cervical adenopathy.     Right cervical: No superficial, deep or posterior cervical adenopathy.    Left cervical: No superficial, deep or posterior cervical adenopathy.     Upper Body:     Right upper body: No supraclavicular or axillary adenopathy.     Left upper body: No supraclavicular or axillary adenopathy.  Neurological:     General: No focal deficit present.     Mental Status: He is alert and oriented to person, place, and time.  Psychiatric:        Mood and Affect: Mood normal.        Behavior: Behavior normal.     LABS:      Latest Ref Rng & Units  07/31/2022    8:16 AM 03/27/2022    9:28 AM 02/18/2022   11:14 AM  CBC  WBC 4.0 - 10.5 K/uL 7.1  5.0  6.0   Hemoglobin 13.0 - 17.0 g/dL 15.8  30.9  40.7   Hematocrit 39.0 - 52.0 % 32.1  30.9  31.2   Platelets 150 - 400 K/uL 314  276  226       Latest Ref Rng & Units 07/31/2022    8:16 AM 03/27/2022    9:28 AM 02/18/2022   11:14 AM  CMP  Glucose 70 - 99 mg/dL 680  93  881   BUN 8 - 23 mg/dL 22  21  19    Creatinine 0.61 - 1.24 mg/dL 1.03  1.59  4.58   Sodium 135 - 145 mmol/L 137  138  137   Potassium 3.5 - 5.1 mmol/L 3.9  4.1  3.7   Chloride 98 - 111 mmol/L 105  103  105   CO2 22 - 32 mmol/L 23  24  23    Calcium 8.9 - 10.3 mg/dL 9.1  9.9  9.4   Total Protein 6.5 - 8.1 g/dL 7.2  7.3  7.2   Total Bilirubin 0.3 - 1.2 mg/dL 0.6  0.7  1.2   Alkaline Phos 38 - 126 U/L 100  68  74   AST 15 - 41 U/L 26  29  25    ALT 0 - 44 U/L 24  20  20       Lab Results  Component Value Date   CEA1 1.4 07/31/2022   /  CEA  Date Value Ref Range Status  07/31/2022 1.4 0.0 - 4.7 ng/mL Final    Comment:    (NOTE)                             Nonsmokers          <3.9  Smokers             <5.6 Roche Diagnostics Electrochemiluminescence Immunoassay (ECLIA) Values obtained with different assay methods or kits cannot be used interchangeably.  Results cannot be interpreted as absolute evidence of the presence or absence of malignant disease. Performed At: [facility] [address] [place], [place] [id number] Jolene SchimkeNagendra Sanjai MD WU:[phone]    No results found for: "PSA1" No results found for: "[id number]" No results found for: "CAN125"  No results found for: "TOTALPROTELP", "ALBUMINELP", "A1GS", "A2GS", "BETS", "BETA2SER", "GAMS", "MSPIKE", "SPEI" Lab Results  Component Value Date   TIBC 444 03/27/2022   TIBC 389 02/18/2022   FERRITIN 93 03/27/2022   FERRITIN 198 02/18/2022   IRONPCTSAT 26 03/27/2022   IRONPCTSAT 15 (L) 02/18/2022   No results found for:  "LDH"   STUDIES:   CT Abdomen Pelvis W Contrast  Result Date: 07/31/2022 CLINICAL DATA:  Follow-up colon and rectal carcinoma. Surveillance. * Tracking Code: BO * EXAM: CT ABDOMEN AND PELVIS WITH CONTRAST TECHNIQUE: Multidetector CT imaging of the abdomen and pelvis was performed using the standard protocol following bolus administration of intravenous contrast. RADIATION DOSE REDUCTION: This exam was performed according to the departmental dose-optimization program which includes automated exposure control, adjustment of the mA and/or kV according to patient size and/or use of iterative reconstruction technique. CONTRAST:  100mL OMNIPAQUE IOHEXOL 300 MG/ML  SOLN COMPARISON:  03/27/2022 FINDINGS: Lower Chest: No acute findings. Hepatobiliary: No hepatic masses identified. Gallbladder is unremarkable. No evidence of biliary ductal dilatation. Pancreas:  No mass or inflammatory changes. Spleen: Within normal limits in size and appearance. Adrenals/Urinary Tract: No suspicious masses identified. No evidence of ureteral calculi or hydronephrosis. Stomach/Bowel: Stable postop changes from subtotal colectomy right lower quadrant ileostomy. No mass identified. 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 No evidence of obstruction, inflammatory process or abnormal fluid collections. Vascular/Lymphatic: No pathologically enlarged lymph nodes. Small lymph nodes in the sigmoid mesentery remain stable, largest measuring 7 mm. No acute vascular findings. Reproductive:  No mass or other significant abnormality. Other:  Stable small left inguinal hernia, which contains only fat. Musculoskeletal: No suspicious bone lesions identified. Severe right hip osteoarthritis again noted. IMPRESSION: Stable exam. No evidence of recurrent or metastatic carcinoma within the abdomen or pelvis. 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 No evidence of bowel  obstruction or strangulation. Stable small left inguinal hernia, which contains only fat. Electronically Signed   By: Danae OrleansJohn A Stahl M.D.   On: 07/31/2022 12:48

## 2022-08-09 ENCOUNTER — Inpatient Hospital Stay: Payer: Medicare Other

## 2022-08-09 VITALS — BP 116/78 | HR 80 | Temp 98.0°F | Resp 18

## 2022-08-09 DIAGNOSIS — D500 Iron deficiency anemia secondary to blood loss (chronic): Secondary | ICD-10-CM

## 2022-08-09 DIAGNOSIS — N189 Chronic kidney disease, unspecified: Secondary | ICD-10-CM | POA: Diagnosis not present

## 2022-08-09 DIAGNOSIS — D631 Anemia in chronic kidney disease: Secondary | ICD-10-CM | POA: Diagnosis not present

## 2022-08-09 DIAGNOSIS — C19 Malignant neoplasm of rectosigmoid junction: Secondary | ICD-10-CM | POA: Diagnosis not present

## 2022-08-09 DIAGNOSIS — Z800 Family history of malignant neoplasm of digestive organs: Secondary | ICD-10-CM | POA: Diagnosis not present

## 2022-08-09 MED ORDER — SODIUM CHLORIDE 0.9 % IV SOLN: Freq: Once | INTRAVENOUS | Status: AC

## 2022-08-09 MED ORDER — SODIUM CHLORIDE 0.9 % IV SOLN
510.0000 mg | Freq: Once | INTRAVENOUS | Status: AC
  Administered 2022-08-09: 510 mg via INTRAVENOUS
  Filled 2022-08-09: qty 510

## 2022-08-09 NOTE — Progress Notes (Signed)
Patient presents today for iron infusion.  Patient is in satisfactory condition with no new complaints voiced.  Vital signs are stable.  IV placed in R hand.  IV flushed well with good blood return noted.  We will proceed with infusion per provider orders.

## 2022-08-09 NOTE — Patient Instructions (Signed)
[facility]  Discharge Instructions: Thank you for choosing [facility] to provide your oncology and hematology care.  If you have a lab appointment with the Cancer Center - please note that after April 8th, 2024, all labs will be drawn in the cancer center.  You do not have to check in or register with the main entrance as you have in the past but will complete your check-in in the cancer center.  Wear comfortable clothing and clothing appropriate for easy access to any Portacath or PICC line.   We strive to give you quality time with your provider. You may need to reschedule your appointment if you arrive late (15 or more minutes).  Arriving late affects you and other patients whose appointments are after yours.  Also, if you miss three or more appointments without notifying the office, you may be dismissed from the clinic at the provider's discretion.      For prescription refill requests, have your pharmacy contact our office and allow 72 hours for refills to be completed.    Today you received the following Feraheme infusion.   Ferumoxytol Injection What is this medication? FERUMOXYTOL (FER ue MOX i tol) treats low levels of iron in your body (iron deficiency anemia). Iron is a mineral that plays an important role in making red blood cells, which carry oxygen from your lungs to the rest of your body. This medicine may be used for other purposes; ask your health care provider or pharmacist if you have questions. COMMON BRAND NAME(S): Feraheme What should I tell my care team before I take this medication? They need to know if you have any of these conditions: Anemia not caused by low iron levels High levels of iron in the blood Magnetic resonance imaging (MRI) test scheduled An unusual or allergic reaction to iron, other medications, foods, dyes, or preservatives Pregnant or trying to get pregnant Breastfeeding How should I use this medication? This  medication is injected into a vein. It is given by your care team in a hospital or clinic setting. Talk to your care team the use of this medication in children. Special care may be needed. Overdosage: If you think you have taken too much of this medicine contact a poison control center or emergency room at once. NOTE: This medicine is only for you. Do not share this medicine with others. What if I miss a dose? It is important not to miss your dose. Call your care team if you are unable to keep an appointment. What may interact with this medication? Other iron products This list may not describe all possible interactions. Give your health care provider a list of all the medicines, herbs, non-prescription drugs, or dietary supplements you use. Also tell them if you smoke, drink alcohol, or use illegal drugs. Some items may interact with your medicine. What should I watch for while using this medication? Visit your care team regularly. Tell your care team if your symptoms do not start to get better or if they get worse. You may need blood work done while you are taking this medication. You may need to follow a special diet. Talk to your care team. Foods that contain iron include: whole grains/cereals, dried fruits, beans, or peas, leafy green vegetables, and organ meats (liver, kidney). What side effects may I notice from receiving this medication? Side effects that you should report to your care team as soon as possible: Allergic reactions--skin rash, itching, hives, swelling of  the face, lips, tongue, or throat Low blood pressure--dizziness, feeling faint or lightheaded, blurry vision Shortness of breath Side effects that usually do not require medical attention (report to your care team if they continue or are bothersome): Flushing Headache Joint pain Muscle pain Nausea Pain, redness, or irritation at injection site This list may not describe all possible side effects. Call your doctor for  medical advice about side effects. You may report side effects to FDA at 1-800-FDA-1088. Where should I keep my medication? This medication is given in a hospital or clinic and will not be stored at home. NOTE: This sheet is a summary. It may not cover all possible information. If you have questions about this medicine, talk to your doctor, pharmacist, or health care provider.  2023 Elsevier/Gold Standard (2020-09-06 00:00:00)    To help prevent nausea and vomiting after your treatment, we encourage you to take your nausea medication as directed.  BELOW ARE SYMPTOMS THAT SHOULD BE REPORTED IMMEDIATELY: *FEVER GREATER THAN 100.4 F (38 C) OR HIGHER *CHILLS OR SWEATING *NAUSEA AND VOMITING THAT IS NOT CONTROLLED WITH YOUR NAUSEA MEDICATION *UNUSUAL SHORTNESS OF BREATH *UNUSUAL BRUISING OR BLEEDING *URINARY PROBLEMS (pain or burning when urinating, or frequent urination) *BOWEL PROBLEMS (unusual diarrhea, constipation, pain near the anus) TENDERNESS IN MOUTH AND THROAT WITH OR WITHOUT PRESENCE OF ULCERS (sore throat, sores in mouth, or a toothache) UNUSUAL RASH, SWELLING OR PAIN  UNUSUAL VAGINAL DISCHARGE OR ITCHING   Items with * indicate a potential emergency and should be followed up as soon as possible or go to the Emergency Department if any problems should occur.  Please show the CHEMOTHERAPY ALERT CARD or IMMUNOTHERAPY ALERT CARD at check-in to the Emergency Department and triage nurse.  Should you have questions after your visit or need to cancel or reschedule your appointment, please contact [facility] [phone]  and follow the prompts.  Office hours are 8:00 a.m. to 4:30 p.m. Monday - Friday. Please note that voicemails left after 4:00 p.m. may not be returned until the following business day.  We are closed weekends and major holidays. You have access to a nurse at all times for urgent questions. Please call the main number to the clinic [phone] and  follow the prompts.  For any non-urgent questions, you may also contact your provider using MyChart. We now offer e-Visits for anyone 71 and older to request care online for non-urgent symptoms. For details visit [email or web address].   Also download the MyChart app! Go to the app store, search "MyChart", open the app, select [place], and log in with your MyChart username and password.

## 2022-08-09 NOTE — Progress Notes (Signed)
Patient tolerated treatment well with no complaints voiced.  Patient left ambulatory in stable condition.  Vital signs stable at discharge.  Follow up as scheduled.    

## 2022-08-16 ENCOUNTER — Inpatient Hospital Stay: Payer: Medicare Other

## 2022-08-16 VITALS — BP 117/82 | HR 69 | Temp 98.1°F | Resp 18

## 2022-08-16 DIAGNOSIS — D500 Iron deficiency anemia secondary to blood loss (chronic): Secondary | ICD-10-CM | POA: Diagnosis not present

## 2022-08-16 DIAGNOSIS — Z800 Family history of malignant neoplasm of digestive organs: Secondary | ICD-10-CM | POA: Diagnosis not present

## 2022-08-16 DIAGNOSIS — N189 Chronic kidney disease, unspecified: Secondary | ICD-10-CM | POA: Diagnosis not present

## 2022-08-16 DIAGNOSIS — C19 Malignant neoplasm of rectosigmoid junction: Secondary | ICD-10-CM | POA: Diagnosis not present

## 2022-08-16 DIAGNOSIS — D631 Anemia in chronic kidney disease: Secondary | ICD-10-CM | POA: Diagnosis not present

## 2022-08-16 MED ORDER — SODIUM CHLORIDE 0.9 % IV SOLN: Freq: Once | INTRAVENOUS | Status: AC

## 2022-08-16 MED ORDER — SODIUM CHLORIDE 0.9 % IV SOLN
510.0000 mg | Freq: Once | INTRAVENOUS | Status: AC
  Administered 2022-08-16: 510 mg via INTRAVENOUS
  Filled 2022-08-16: qty 510

## 2022-08-16 NOTE — Patient Instructions (Signed)
[facility]  Discharge Instructions: Thank you for choosing [facility] to provide your oncology and hematology care.  If you have a lab appointment with the Cancer Center - please note that after April 8th, 2024, all labs will be drawn in the cancer center.  You do not have to check in or register with the main entrance as you have in the past but will complete your check-in in the cancer center.  Wear comfortable clothing and clothing appropriate for easy access to any Portacath or PICC line.   We strive to give you quality time with your provider. You may need to reschedule your appointment if you arrive late (15 or more minutes).  Arriving late affects you and other patients whose appointments are after yours.  Also, if you miss three or more appointments without notifying the office, you may be dismissed from the clinic at the provider's discretion.      For prescription refill requests, have your pharmacy contact our office and allow 72 hours for refills to be completed.    Today you received the following: Feraheme.  Ferumoxytol Injection What is this medication? FERUMOXYTOL (FER ue MOX i tol) treats low levels of iron in your body (iron deficiency anemia). Iron is a mineral that plays an important role in making red blood cells, which carry oxygen from your lungs to the rest of your body. This medicine may be used for other purposes; ask your health care provider or pharmacist if you have questions. COMMON BRAND NAME(S): Feraheme What should I tell my care team before I take this medication? They need to know if you have any of these conditions: Anemia not caused by low iron levels High levels of iron in the blood Magnetic resonance imaging (MRI) test scheduled An unusual or allergic reaction to iron, other medications, foods, dyes, or preservatives Pregnant or trying to get pregnant Breastfeeding How should I use this medication? This medication  is injected into a vein. It is given by your care team in a hospital or clinic setting. Talk to your care team the use of this medication in children. Special care may be needed. Overdosage: If you think you have taken too much of this medicine contact a poison control center or emergency room at once. NOTE: This medicine is only for you. Do not share this medicine with others. What if I miss a dose? It is important not to miss your dose. Call your care team if you are unable to keep an appointment. What may interact with this medication? Other iron products This list may not describe all possible interactions. Give your health care provider a list of all the medicines, herbs, non-prescription drugs, or dietary supplements you use. Also tell them if you smoke, drink alcohol, or use illegal drugs. Some items may interact with your medicine. What should I watch for while using this medication? Visit your care team regularly. Tell your care team if your symptoms do not start to get better or if they get worse. You may need blood work done while you are taking this medication. You may need to follow a special diet. Talk to your care team. Foods that contain iron include: whole grains/cereals, dried fruits, beans, or peas, leafy green vegetables, and organ meats (liver, kidney). What side effects may I notice from receiving this medication? Side effects that you should report to your care team as soon as possible: Allergic reactions--skin rash, itching, hives, swelling of the face,   lips, tongue, or throat Low blood pressure--dizziness, feeling faint or lightheaded, blurry vision Shortness of breath Side effects that usually do not require medical attention (report to your care team if they continue or are bothersome): Flushing Headache Joint pain Muscle pain Nausea Pain, redness, or irritation at injection site This list may not describe all possible side effects. Call your doctor for medical  advice about side effects. You may report side effects to FDA at 1-800-FDA-1088. Where should I keep my medication? This medication is given in a hospital or clinic and will not be stored at home. NOTE: This sheet is a summary. It may not cover all possible information. If you have questions about this medicine, talk to your doctor, pharmacist, or health care provider.  2023 Elsevier/Gold Standard (2020-09-06 00:00:00)       To help prevent nausea and vomiting after your treatment, we encourage you to take your nausea medication as directed.  BELOW ARE SYMPTOMS THAT SHOULD BE REPORTED IMMEDIATELY: *FEVER GREATER THAN 100.4 F (38 C) OR HIGHER *CHILLS OR SWEATING *NAUSEA AND VOMITING THAT IS NOT CONTROLLED WITH YOUR NAUSEA MEDICATION *UNUSUAL SHORTNESS OF BREATH *UNUSUAL BRUISING OR BLEEDING *URINARY PROBLEMS (pain or burning when urinating, or frequent urination) *BOWEL PROBLEMS (unusual diarrhea, constipation, pain near the anus) TENDERNESS IN MOUTH AND THROAT WITH OR WITHOUT PRESENCE OF ULCERS (sore throat, sores in mouth, or a toothache) UNUSUAL RASH, SWELLING OR PAIN  UNUSUAL VAGINAL DISCHARGE OR ITCHING   Items with * indicate a potential emergency and should be followed up as soon as possible or go to the Emergency Department if any problems should occur.  Please show the CHEMOTHERAPY ALERT CARD or IMMUNOTHERAPY ALERT CARD at check-in to the Emergency Department and triage nurse.  Should you have questions after your visit or need to cancel or reschedule your appointment, please contact [facility] [phone]  and follow the prompts.  Office hours are 8:00 a.m. to 4:30 p.m. Monday - Friday. Please note that voicemails left after 4:00 p.m. may not be returned until the following business day.  We are closed weekends and major holidays. You have access to a nurse at all times for urgent questions. Please call the main number to the clinic [phone] and follow  the prompts.  For any non-urgent questions, you may also contact your provider using MyChart. We now offer e-Visits for anyone 18 and older to request care online for non-urgent symptoms. For details visit [email or web address].   Also download the MyChart app! Go to the app store, search "MyChart", open the app, select [facility], and log in with your MyChart username and password.   

## 2022-08-16 NOTE — Progress Notes (Signed)
Patient presents today for Feraheme IV iron infusion. Vital signs are stable. Patient denies any changes since the last iron infusion. Patient denies any complaints today. MAR reviewed and updated.    Feraheme given today per MD orders. Tolerated infusion without adverse affects. Vital signs stable. No complaints at this time. Discharged from clinic ambulatory in stable condition. Alert and oriented x 3. F/U with [facility] as scheduled.   

## 2022-08-21 ENCOUNTER — Other Ambulatory Visit: Payer: Self-pay | Admitting: Hematology

## 2022-08-21 DIAGNOSIS — Z932 Ileostomy status: Secondary | ICD-10-CM | POA: Diagnosis not present

## 2022-08-21 DIAGNOSIS — Z85038 Personal history of other malignant neoplasm of large intestine: Secondary | ICD-10-CM | POA: Diagnosis not present

## 2022-08-21 DIAGNOSIS — M170 Bilateral primary osteoarthritis of knee: Secondary | ICD-10-CM

## 2022-08-22 ENCOUNTER — Ambulatory Visit: Payer: Medicare Other | Admitting: General Surgery

## 2022-08-22 ENCOUNTER — Encounter: Payer: Self-pay | Admitting: General Surgery

## 2022-08-22 VITALS — BP 113/69 | HR 84 | Temp 98.2°F | Resp 14 | Ht 67.0 in | Wt 180.0 lb

## 2022-08-22 DIAGNOSIS — K435 Parastomal hernia without obstruction or  gangrene: Secondary | ICD-10-CM

## 2022-08-22 NOTE — Progress Notes (Signed)
[facility]  Overall feeling good. Says he is doing well with the ileostomy. He is worried about having diarrhea and leaking and needing to wear expensive depends.  BP 113/69   Pulse 84   Temp 98.2 F (36.8 C) (Oral)   Resp 14   Ht 5\' 7"  (1.702 m)   Wt 180 lb (81.6 kg)   SpO2 97%   BMI 28.19 kg/m  Ostomy with liquid stool.  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 He is disease free. CT scan is reassuring. He does have a stump that is about 10cm looking at it.   CT with parastomal hernia   He wants to hold on any reversal right now given that I cannot guarantee no leakage and no need for depends. He will still have to take imodium and may have to take more.   Call with issues or changes.  Parastomal hernia around your ileostomy, monitor. Ileostomy care. Will discuss options for reversal again in October if you like. He would need a scope of the stump to check this out before reversal.   Future Appointments  Date Time Provider Department Center  10/28/2022  3:00 PM [facility] None  11/04/2022  3:00 PM Doreatha Massed, MD [facility] None  01/29/2023  2:00 PM Lucretia Roers, MD [facility] None   Algis Greenhouse, MD [facility] [address] [place], [place] [address] [phone] (office)

## 2022-08-22 NOTE — Patient Instructions (Signed)
Call with issues or changes.  Parastomal hernia around your ileostomy, monitor. Ileostomy care. Will discuss options for reversal again in October if you like.

## 2022-09-18 ENCOUNTER — Other Ambulatory Visit: Payer: Self-pay | Admitting: Hematology

## 2022-09-18 DIAGNOSIS — M170 Bilateral primary osteoarthritis of knee: Secondary | ICD-10-CM

## 2022-10-14 DIAGNOSIS — Z85038 Personal history of other malignant neoplasm of large intestine: Secondary | ICD-10-CM | POA: Diagnosis not present

## 2022-10-14 DIAGNOSIS — Z932 Ileostomy status: Secondary | ICD-10-CM | POA: Diagnosis not present

## 2022-10-24 ENCOUNTER — Other Ambulatory Visit: Payer: Self-pay

## 2022-10-24 DIAGNOSIS — D500 Iron deficiency anemia secondary to blood loss (chronic): Secondary | ICD-10-CM

## 2022-10-28 ENCOUNTER — Inpatient Hospital Stay: Payer: Medicare Other | Attending: Hematology

## 2022-10-28 DIAGNOSIS — D509 Iron deficiency anemia, unspecified: Secondary | ICD-10-CM | POA: Diagnosis not present

## 2022-10-28 DIAGNOSIS — Z800 Family history of malignant neoplasm of digestive organs: Secondary | ICD-10-CM | POA: Diagnosis not present

## 2022-10-28 DIAGNOSIS — Z85038 Personal history of other malignant neoplasm of large intestine: Secondary | ICD-10-CM | POA: Diagnosis not present

## 2022-10-28 DIAGNOSIS — D631 Anemia in chronic kidney disease: Secondary | ICD-10-CM | POA: Insufficient documentation

## 2022-10-28 DIAGNOSIS — D500 Iron deficiency anemia secondary to blood loss (chronic): Secondary | ICD-10-CM

## 2022-10-28 DIAGNOSIS — N189 Chronic kidney disease, unspecified: Secondary | ICD-10-CM | POA: Insufficient documentation

## 2022-10-28 LAB — HEPATIC FUNCTION PANEL
ALT: 21 U/L (ref 0–44)
AST: 25 U/L (ref 15–41)
Albumin: 3.9 g/dL (ref 3.5–5.0)
Alkaline Phosphatase: 83 U/L (ref 38–126)
Bilirubin, Direct: 0.1 mg/dL (ref 0.0–0.2)
Indirect Bilirubin: 0.5 mg/dL (ref 0.3–0.9)
Total Bilirubin: 0.6 mg/dL (ref 0.3–1.2)
Total Protein: 7.5 g/dL (ref 6.5–8.1)

## 2022-10-28 LAB — CBC WITH DIFFERENTIAL/PLATELET
Abs Immature Granulocytes: 0.02 10*3/uL (ref 0.00–0.07)
Basophils Absolute: 0 10*3/uL (ref 0.0–0.1)
Basophils Relative: 1 %
Eosinophils Absolute: 0.5 10*3/uL (ref 0.0–0.5)
Eosinophils Relative: 8 %
HCT: 36 % — ABNORMAL LOW (ref 39.0–52.0)
Hemoglobin: 12.2 g/dL — ABNORMAL LOW (ref 13.0–17.0)
Immature Granulocytes: 0 %
Lymphocytes Relative: 26 %
Lymphs Abs: 1.6 10*3/uL (ref 0.7–4.0)
MCH: 34.3 pg — ABNORMAL HIGH (ref 26.0–34.0)
MCHC: 33.9 g/dL (ref 30.0–36.0)
MCV: 101.1 fL — ABNORMAL HIGH (ref 80.0–100.0)
Monocytes Absolute: 0.5 10*3/uL (ref 0.1–1.0)
Monocytes Relative: 8 %
Neutro Abs: 3.5 10*3/uL (ref 1.7–7.7)
Neutrophils Relative %: 57 %
Platelets: 216 10*3/uL (ref 150–400)
RBC: 3.56 MIL/uL — ABNORMAL LOW (ref 4.22–5.81)
RDW: 12.6 % (ref 11.5–15.5)
WBC: 6.2 10*3/uL (ref 4.0–10.5)
nRBC: 0 % (ref 0.0–0.2)

## 2022-10-29 LAB — CEA: CEA: 1.8 ng/mL (ref 0.0–4.7)

## 2022-11-04 ENCOUNTER — Inpatient Hospital Stay: Payer: Medicare Other | Admitting: Hematology

## 2022-11-04 VITALS — BP 110/87 | HR 85 | Temp 98.0°F | Resp 18 | Ht 67.0 in | Wt 174.7 lb

## 2022-11-04 DIAGNOSIS — D631 Anemia in chronic kidney disease: Secondary | ICD-10-CM | POA: Diagnosis not present

## 2022-11-04 DIAGNOSIS — C19 Malignant neoplasm of rectosigmoid junction: Secondary | ICD-10-CM

## 2022-11-04 DIAGNOSIS — D500 Iron deficiency anemia secondary to blood loss (chronic): Secondary | ICD-10-CM | POA: Diagnosis not present

## 2022-11-04 DIAGNOSIS — N189 Chronic kidney disease, unspecified: Secondary | ICD-10-CM | POA: Diagnosis not present

## 2022-11-04 DIAGNOSIS — Z800 Family history of malignant neoplasm of digestive organs: Secondary | ICD-10-CM | POA: Diagnosis not present

## 2022-11-04 DIAGNOSIS — Z85038 Personal history of other malignant neoplasm of large intestine: Secondary | ICD-10-CM | POA: Diagnosis not present

## 2022-11-04 DIAGNOSIS — D509 Iron deficiency anemia, unspecified: Secondary | ICD-10-CM | POA: Diagnosis not present

## 2022-11-04 NOTE — Progress Notes (Signed)
[facility] 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  Total examined lymph nodes 35.  MMR-preserved. - CT CAP on 07/31/2021 with no evidence of metastatic disease. - Xeloda in the adjuvant setting from 09/05/2021 through 03/25/2022    Social/family history: - He lives by himself.  He is a retired Personnel officer.  No exposure to asbestos.  Non-smoker. - Father had colon cancer in his 32s.  Maternal uncle had colon cancer.  Plan: T4N0 sigmoid colon cancer: - He denies any change in bowel habits.  No bleeding per rectum or melena. - Last CTAP on 07/31/2022: No evidence of recurrence or metastatic disease. - Labs from 10/28/2022: Normal LFTs.  CBC grossly normal.  CEA is 1.8. - Recommend follow-up in 3 months with repeat CTAP, CEA and routine labs.  2.  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 - Received Feraheme on 08/09/2022 on 08/16/2022. - Hemoglobin improved to 12.2 from 10.7.  Repeat ferritin and iron panel in 3 months.  3.  CKD: - Creatinine elevated since 11/26/2021. - Will make referral to Dr. Wolfgang Phoenix.    Orders Placed This Encounter  Procedures   CT Abdomen Pelvis W Contrast    Standing Status:   Future    Standing Expiration Date:   11/04/2023    Order Specific Question:   If indicated for the ordered procedure, I authorize the  administration of contrast media per Radiology protocol    Answer:   Yes    Order Specific Question:   Does the patient have a contrast media/X-ray dye allergy?    Answer:   No    Order Specific Question:   Preferred imaging location?    Answer:   [facility]    Order Specific Question:   Release to patient    Answer:   Immediate [1]    Order Specific Question:   If indicated for the ordered procedure, I authorize the administration of oral contrast media per Radiology protocol    Answer:   Yes   CEA    Standing Status:   Future    Standing Expiration Date:   11/04/2023   CBC with Differential/Platelet    Standing Status:   Future    Standing Expiration Date:   11/04/2023    Order Specific Question:   Release to patient    Answer:   Immediate   Comprehensive metabolic panel    Standing Status:   Future    Standing Expiration Date:   11/04/2023    Order Specific Question:   Release to patient    Answer:   Immediate   Ferritin    Standing Status:   Future    Standing Expiration Date:   11/04/2023    Order Specific Question:   Release to patient    Answer:   Immediate   Iron and TIBC    Standing Status:   Future  Standing Expiration Date:   11/04/2023    Order Specific Question:   Release to patient    Answer:   Immediate       I,Helena R Teague,acting as a scribe for Doreatha Massed, MD.,have documented all relevant documentation on the behalf of Doreatha Massed, MD,as directed by  Doreatha Massed, MD while in the presence of Doreatha Massed, MD.  I, Doreatha Massed MD, have reviewed the above documentation for accuracy and completeness, and I agree with the above.    Doreatha Massed, MD   7/8/20244:26 PM  CHIEF COMPLAINT:   Diagnosis: rectosigmoid cancer    Cancer Staging  Cancer of rectosigmoid (colon) (HCC) Staging form: Colon and Rectum, AJCC 8th Edition - Clinical stage from 07/31/2021: Stage IIB (cT4a(2), cN0, cM0) - Signed by Dellia Beckwith, MD on 09/01/2021  Cecal cancer [facility]) Staging form: Colon and Rectum, AJCC 8th Edition - Clinical stage from 07/31/2021: Stage IIA (cT3(2), cN0, cM0) - Signed by Dellia Beckwith, MD on 09/01/2021    Prior Therapy: none  Current Therapy:  Xeloda   HISTORY OF PRESENT ILLNESS:   Oncology History  Cecal cancer (HCC)  07/31/2021 Cancer Staging   Staging form: Colon and Rectum, AJCC 8th Edition - Clinical stage from 07/31/2021: Stage IIA (cT3(2), cN0, cM0) - Signed by Dellia Beckwith, MD on 09/01/2021 Histopathologic type: Adenocarcinoma, NOS Stage prefix: Initial diagnosis Total positive nodes: 0 Total nodes examined: 35 Histologic grade (G): G2 Histologic grading system: 4 grade system Laterality: Right Bilateral cancer: Yes Tumor size (mm): 74 Multiple tumors: Yes Number of tumors: 2 Lymph-vascular invasion (LVI): LVI not present (absent)/not identified Diagnostic confirmation: Positive histology Specimen type: Excision Staged by: Managing physician Tumor deposits (TD): Absent Perineural invasion (PNI): Absent Microsatellite instability (MSI): Stable KRAS mutation: Not assessed NRAS mutation: Not assessed BRAF mutation: Not assessed Stage used in treatment planning: Yes National guidelines used in treatment planning: Yes Type of national guideline used in treatment planning: NCCN   08/28/2021 Initial Diagnosis   Cecal cancer (HCC)   Cancer of rectosigmoid (colon) (HCC)  07/31/2021 Initial Diagnosis   Cancer of rectosigmoid (colon) (HCC)   07/31/2021 Cancer Staging   Staging form: Colon and Rectum, AJCC 8th Edition - Clinical stage from 07/31/2021: Stage IIB (cT4a(2), cN0, cM0) - Signed by Dellia Beckwith, MD on 09/01/2021 Histopathologic type: Adenocarcinoma, NOS Stage prefix: Initial diagnosis Total positive nodes: 0 Total nodes examined: 35 Histologic grade (G): G2 Histologic grading system: 4 grade system Laterality: Left Bilateral cancer: Yes Tumor  size (mm): 83 Multiple tumors: Yes Number of tumors: 2 Lymph-vascular invasion (LVI): LVI not present (absent)/not identified Diagnostic confirmation: Positive histology Specimen type: Excision Staged by: Managing physician Tumor deposits (TD): Absent Carcinoembryonic antigen (CEA) (ng/mL): 1.6 Perineural invasion (PNI): Absent Microsatellite instability (MSI): Stable KRAS mutation: Not assessed NRAS mutation: Not assessed BRAF mutation: Not assessed Stage used in treatment planning: Yes National guidelines used in treatment planning: Yes Type of national guideline used in treatment planning: NCCN   09/12/2021 - 09/12/2021 Chemotherapy   Patient is on Treatment Plan : COLORECTAL Capecitabine q21d        INTERVAL HISTORY:   Todd Richardson is a 67 y.o. male presenting to clinic today for follow up of rectosigmoid cancer. He was last seen by me on 08/05/22.  Today, he states that he is doing okay overall. His appetite level is at 75%. His energy level is at 75%.  He denies any changes in bowels, blood in bowels, or new pains.  He notes he usually loses weight in the summer.  He reports he is taking vitamin supplements that he attributes to improves energy levels. He does not believe that iron improved his energy levels.   Patient was unaware of low creatinine levels occurring since July 2023.   PAST MEDICAL HISTORY:   Past Medical History: Past Medical History:  Diagnosis Date   Arthritis    Asthma    Vertigo     Surgical History: Past Surgical History:  Procedure Laterality Date   BIOPSY  07/31/2021   Procedure: BIOPSY;  Surgeon: Lanelle Bal, DO;  Location[facility];  Service: Endoscopy;;   CATARACT EXTRACTION Left    COLON RESECTION N/A 08/02/2021   Procedure: En bloc Resection of Colon and Small bowel;  Surgeon: Lucretia Roers, MD;  Location: [facility] ORS;  Service: General;  Laterality: N/A;   COLOSTOMY Right 08/02/2021   Procedure: ILEOSTOMY;  Surgeon: Lucretia Roers,  MD;  Location: [facility] ORS;  Service: General;  Laterality: Right;   FLEXIBLE SIGMOIDOSCOPY  07/31/2021   Procedure: FLEXIBLE SIGMOIDOSCOPY;  Surgeon: Lanelle Bal, DO;  Location: [facility];  Service: Endoscopy;;   SUBMUCOSAL TATTOO INJECTION  07/31/2021   Procedure: SUBMUCOSAL TATTOO INJECTION;  Surgeon: Lanelle Bal, DO;  Location: [facility];  Service: Endoscopy;;   SUBTOTAL COLECTOMY     end ileostomy; sigmoid and cecal cancers    Social History: Social History   Socioeconomic History   Marital status: Single    Spouse name: Not on file   Number of children: 0   Years of education: 12+1   Highest education level: Some college, no degree  Occupational History   Not on file  Tobacco Use   Smoking status: Never   Smokeless tobacco: Never  Vaping Use   Vaping Use: Never used  Substance and Sexual Activity   Alcohol use: Not Currently    Alcohol/week: 4.0 standard drinks of alcohol    Types: 4 Cans of beer per week    Comment: every day-none in 6 months or more   Drug use: Never   Sexual activity: Not Currently  Other Topics Concern   Not on file  Social History Narrative   Not on file   Social Determinants of Health   Financial Resource Strain: Not on file  Food Insecurity: Not on file  Transportation Needs: Not on file  Physical Activity: Not on file  Stress: No Stress Concern Present (08/31/2021)   [employer] of Occupational Health - Occupational Stress Questionnaire    Feeling of Stress : Not at all  Social Connections: Not on file  Intimate Partner Violence: Not At Risk (08/31/2021)   Humiliation, Afraid, Rape, and Kick questionnaire    Fear of Current or Ex-Partner: No    Emotionally Abused: No    Physically Abused: No    Sexually Abused: No    Family History: Family History  Problem Relation Age of Onset   Hypertension Mother    Diabetes Mother    Parkinson's disease Father    Dementia Father     Current Medications:  Current  Outpatient Medications:    diphenhydrAMINE (BENADRYL) 25 mg capsule, Take 25 mg by mouth as needed., Disp: , Rfl:    Loperamide HCl (IMODIUM PO), Take 1 tablet by mouth 4 (four) times daily., Disp: , Rfl:    meloxicam (MOBIC) 7.5 MG tablet, Take 1 tablet by mouth once daily, Disp: 30 tablet, Rfl: 0   psyllium (HYDROCIL/METAMUCIL)  95 % PACK, Take 1 packet by mouth daily., Disp: 240 each, Rfl: 2   Allergies: Allergies  Allergen Reactions   Apple Anaphylaxis    Other reaction(s): Unknown   Apple Juice Anaphylaxis    Throat swelling     REVIEW OF SYSTEMS:   Review of Systems  Constitutional:  Negative for chills, fatigue and fever.  HENT:   Negative for lump/mass, mouth sores, nosebleeds, sore throat and trouble swallowing.   Eyes:  Negative for eye problems.  Respiratory:  Negative for cough and shortness of breath.   Cardiovascular:  Negative for chest pain, leg swelling and palpitations.  Gastrointestinal:  Negative for abdominal pain, constipation, diarrhea, nausea and vomiting.  Genitourinary:  Negative for bladder incontinence, difficulty urinating, dysuria, frequency, hematuria and nocturia.   Musculoskeletal:  Positive for arthralgias (right knee, 5/10 severity). Negative for back pain, flank pain, myalgias and neck pain.  Skin:  Negative for itching and rash.  Neurological:  Negative for dizziness, headaches and numbness.  Hematological:  Does not bruise/bleed easily.  Psychiatric/Behavioral:  Negative for depression, sleep disturbance and suicidal ideas. The patient is not nervous/anxious.   All other systems reviewed and are negative.    VITALS:   Blood pressure 110/87, pulse 85, temperature 98 F (36.7 C), temperature source Oral, resp. rate 18, height 5\' 7"  (1.702 m), weight 174 lb 11.2 oz (79.2 kg), SpO2 98 %.  Wt Readings from Last 3 Encounters:  11/04/22 174 lb 11.2 oz (79.2 kg)  08/22/22 180 lb (81.6 kg)  08/05/22 178 lb (80.7 kg)    Body mass index is 27.36  kg/m.  Performance status (ECOG): 1 - Symptomatic but completely ambulatory  PHYSICAL EXAM:   Physical Exam Vitals and nursing note reviewed. Exam conducted with a chaperone present.  Constitutional:      Appearance: Normal appearance.  Cardiovascular:     Rate and Rhythm: Normal rate and regular rhythm.     Pulses: Normal pulses.     Heart sounds: Normal heart sounds.  Pulmonary:     Effort: Pulmonary effort is normal.     Breath sounds: Normal breath sounds.  Abdominal:     Palpations: Abdomen is soft. There is no hepatomegaly, splenomegaly or mass.     Tenderness: There is no abdominal tenderness.  Musculoskeletal:     Right lower leg: No edema.     Left lower leg: No edema.  Lymphadenopathy:     Cervical: No cervical adenopathy.     Right cervical: No superficial, deep or posterior cervical adenopathy.    Left cervical: No superficial, deep or posterior cervical adenopathy.     Upper Body:     Right upper body: No supraclavicular or axillary adenopathy.     Left upper body: No supraclavicular or axillary adenopathy.  Neurological:     General: No focal deficit present.     Mental Status: He is alert and oriented to person, place, and time.  Psychiatric:        Mood and Affect: Mood normal.        Behavior: Behavior normal.     LABS:      Latest Ref Rng & Units 10/28/2022    2:53 PM 07/31/2022    8:16 AM 03/27/2022    9:28 AM  CBC  WBC 4.0 - 10.5 K/uL 6.2  7.1  5.0   Hemoglobin 13.0 - 17.0 g/dL 81.1  91.4  78.2   Hematocrit 39.0 - 52.0 % 36.0  32.1  30.9  Platelets 150 - 400 K/uL 216  314  276       Latest Ref Rng & Units 10/28/2022    2:53 PM 07/31/2022    8:16 AM 03/27/2022    9:28 AM  CMP  Glucose 70 - 99 mg/dL  161  93   BUN 8 - 23 mg/dL  22  21   Creatinine 0.96 - 1.24 mg/dL  0.45  4.09   Sodium 811 - 145 mmol/L  137  138   Potassium 3.5 - 5.1 mmol/L  3.9  4.1   Chloride 98 - 111 mmol/L  105  103   CO2 22 - 32 mmol/L  23  24   Calcium 8.9 - 10.3  mg/dL  9.1  9.9   Total Protein 6.5 - 8.1 g/dL 7.5  7.2  7.3   Total Bilirubin 0.3 - 1.2 mg/dL 0.6  0.6  0.7   Alkaline Phos 38 - 126 U/L 83  100  68   AST 15 - 41 U/L 25  26  29    ALT 0 - 44 U/L 21  24  20       Lab Results  Component Value Date   CEA1 1.8 10/28/2022   /  CEA  Date Value Ref Range Status  10/28/2022 1.8 0.0 - 4.7 ng/mL Final    Comment:    (NOTE)                             Nonsmokers          <3.9                             Smokers             <5.6 Roche [employer] Electrochemiluminescence Immunoassay (ECLIA) Values obtained with different assay methods or kits cannot be used interchangeably.  Results cannot be interpreted as absolute evidence of the presence or absence of malignant disease. Performed At: [facility] [address] [place], [place] [id number] Jolene Schimke MD OZ:[id number]    No results found for: "PSA1" No results found for: "CAN199" No results found for: "CAN125"  No results found for: "TOTALPROTELP", "ALBUMINELP", "A1GS", "A2GS", "BETS", "BETA2SER", "GAMS", "MSPIKE", "SPEI" Lab Results  Component Value Date   TIBC 444 03/27/2022   TIBC 389 02/18/2022   FERRITIN 93 03/27/2022   FERRITIN 198 02/18/2022   IRONPCTSAT 26 03/27/2022   IRONPCTSAT 15 (L) 02/18/2022   No results found for: "LDH"   STUDIES:   No results found.

## 2022-11-04 NOTE — Patient Instructions (Signed)
[facility]  Discharge Instructions  You were seen and examined today by Dr. Ellin Saba.  Dr. Ellin Saba discussed your most recent lab work and CT scan which revealed that everything looks stable except your kidney function creatinine is staying elevated.  Dr. Ellin Saba is going to refer you to a kidney specialist to keep a watch on your kidneys.   Dr. Ellin Saba will repeat CT scan and labs before your next appointment.  Follow-up as scheduled in 3 months.    Thank you for choosing [facility] to provide your oncology and hematology care.   To afford each patient quality time with our provider, please arrive at least 15 minutes before your scheduled appointment time. You may need to reschedule your appointment if you arrive late (10 or more minutes). Arriving late affects you and other patients whose appointments are after yours.  Also, if you miss three or more appointments without notifying the office, you may be dismissed from the clinic at the provider's discretion.    Again, thank you for choosing [facility].  Our hope is that these requests will decrease the amount of time that you wait before being seen by our physicians.   If you have a lab appointment with the Cancer Center - please note that after April 8th, all labs will be drawn in the cancer center.  You do not have to check in or register with the main entrance as you have in the past but will complete your check-in at the cancer center.            _____________________________________________________________  Should you have questions after your visit to [facility], please contact our office at [phone] and follow the prompts.  Our office hours are 8:00 a.m. to 4:30 p.m. Monday - Thursday and 8:00 a.m. to 2:30 p.m. Friday.  Please note that voicemails left after 4:00 p.m. may not be returned until the following business day.  We are  closed weekends and all major holidays.  You do have access to a nurse 24-7, just call the main number to the clinic [phone] and do not press any options, hold on the line and a nurse will answer the phone.    For prescription refill requests, have your pharmacy contact our office and allow 72 hours.    Masks are no longer required in the cancer centers. If you would like for your care team to wear a mask while they are taking care of you, please let them know. You may have one support person who is at least 66 years old accompany you for your appointments.

## 2022-11-28 ENCOUNTER — Telehealth: Payer: Self-pay | Admitting: *Deleted

## 2022-11-28 NOTE — Telephone Encounter (Signed)
Received call from patient [phone]~ telephone.   Patient reports that he has skin irritation surrounding the ostomy site. Reports that ostomy wafer attaches well to skin, but then a tape cover is placed over wafer. States that he feels irritation is due to combination of tape and heat.   States that he has been using Neosporin on irritation as it does open and bleed. Advised to stop Neosporin as there is no evidence of infection.   Patient requested appointment with Dr. Henreitta Leber to evaluate. Appointment scheduled.

## 2022-12-03 ENCOUNTER — Encounter: Payer: Self-pay | Admitting: General Surgery

## 2022-12-03 ENCOUNTER — Ambulatory Visit: Payer: Medicare Other | Admitting: General Surgery

## 2022-12-03 VITALS — BP 129/87 | HR 85 | Temp 97.8°F | Resp 14 | Ht 67.0 in | Wt 172.0 lb

## 2022-12-03 DIAGNOSIS — K435 Parastomal hernia without obstruction or  gangrene: Secondary | ICD-10-CM | POA: Diagnosis not present

## 2022-12-03 DIAGNOSIS — R21 Rash and other nonspecific skin eruption: Secondary | ICD-10-CM

## 2022-12-03 DIAGNOSIS — Z932 Ileostomy status: Secondary | ICD-10-CM

## 2022-12-03 DIAGNOSIS — L720 Epidermal cyst: Secondary | ICD-10-CM | POA: Diagnosis not present

## 2022-12-03 MED ORDER — METHYLPREDNISOLONE 4 MG PO TBPK: ORAL_TABLET | ORAL | 0 refills | Status: DC

## 2022-12-03 NOTE — Progress Notes (Unsigned)
[facility]  Patient having issues with his ostomy having redness and irritation around the ostomy. He has been doing well with his ostomy care. He is wanting to consider reversal in the fall before the end of the year. He sees Dr. Ellin Saba in October and has repeat CT.  Patient also having issues with a rash on his upper extremity. He does not think he has been exposed to any poison oak and has not changed his detergent or soaps.    He also reports a bump on his back he asked me to look at. He does not have a PCP right now.  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 He has about 10cm of rectal stump based on the CT scan from prior. He will likely have to continue his imodium with reversal but he is ok with this plan.   Will get Dr. Marletta Lor to see you and a scope of your rectum to ensure no cancer and the length of the stump. Continue with your ostomy care.  Will do a prednisone pack for your rash.  Get a PCP   Future Appointments  Date Time Provider Department Center  02/04/2023  2:00 PM [facility] LAB [facility] None  02/04/2023  3:00 PM [facility] 1 [facility]  02/11/2023  2:15 PM Doreatha Massed, MD [facility] None  02/18/2023 10:45 AM Lucretia Roers, MD [facility] None   Algis Greenhouse, MD [facility] [address] [place]ella R[place], [place] [address] [phone] (office)

## 2022-12-03 NOTE — Patient Instructions (Addendum)
Will get Dr. Marletta Lor to see you and a scope of your rectum to ensure no cancer and the length of the stump. Continue with your ostomy care.  Will do a prednisone pack for your rash.  Get a PCP

## 2022-12-04 ENCOUNTER — Telehealth: Payer: Self-pay | Admitting: Internal Medicine

## 2022-12-04 NOTE — Telephone Encounter (Signed)
Per Dr. Marletta Lor regarding fleet enema "just one should be fine in his rectum to get stump cleasned out"

## 2022-12-04 NOTE — Telephone Encounter (Signed)
Called pt, no answer and VM full

## 2022-12-04 NOTE — Telephone Encounter (Signed)
Spoke with pt. He has been scheduled for 8/19. Aware will call back with pre-op appt.

## 2022-12-04 NOTE — Telephone Encounter (Signed)
Patient needs flexible sigmoidoscopy to evaluate his stump, diagnosis history of adenocarcinoma of the colon. ASA 3. Can we set this up?  Thank you!

## 2022-12-05 NOTE — Telephone Encounter (Signed)
Patient called back. He is aware of pre-op appt details. Also discussed instructions with him. He voiced understanding.

## 2022-12-05 NOTE — Telephone Encounter (Signed)
Called pt, VM full

## 2022-12-12 ENCOUNTER — Encounter (HOSPITAL_COMMUNITY): Payer: Self-pay

## 2022-12-13 ENCOUNTER — Encounter (HOSPITAL_COMMUNITY)
Admission: RE | Admit: 2022-12-13 | Discharge: 2022-12-13 | Disposition: A | Discharge status: Home / Self Care | Payer: Medicare Other | Source: Ambulatory Visit | Attending: Internal Medicine | Admitting: Internal Medicine

## 2022-12-16 ENCOUNTER — Ambulatory Visit (HOSPITAL_COMMUNITY): Payer: Medicare Other | Admitting: Anesthesiology

## 2022-12-16 ENCOUNTER — Encounter (HOSPITAL_COMMUNITY): Payer: Self-pay

## 2022-12-16 ENCOUNTER — Ambulatory Visit (HOSPITAL_BASED_OUTPATIENT_CLINIC_OR_DEPARTMENT_OTHER): Payer: Medicare Other | Admitting: Anesthesiology

## 2022-12-16 ENCOUNTER — Encounter (HOSPITAL_COMMUNITY)
Admission: RE | Disposition: A | Discharge status: Home / Self Care | Payer: Self-pay | Source: Home / Self Care | Attending: Internal Medicine

## 2022-12-16 ENCOUNTER — Ambulatory Visit (HOSPITAL_COMMUNITY)
Admission: RE | Admit: 2022-12-16 | Discharge: 2022-12-16 | Disposition: A | Discharge status: Home / Self Care | Payer: Medicare Other | Attending: Internal Medicine | Admitting: Internal Medicine

## 2022-12-16 DIAGNOSIS — Z08 Encounter for follow-up examination after completed treatment for malignant neoplasm: Secondary | ICD-10-CM | POA: Diagnosis not present

## 2022-12-16 DIAGNOSIS — Z1211 Encounter for screening for malignant neoplasm of colon: Secondary | ICD-10-CM | POA: Insufficient documentation

## 2022-12-16 DIAGNOSIS — Z85038 Personal history of other malignant neoplasm of large intestine: Secondary | ICD-10-CM | POA: Diagnosis not present

## 2022-12-16 DIAGNOSIS — Z86010 Personal history of colonic polyps: Secondary | ICD-10-CM

## 2022-12-16 DIAGNOSIS — J45909 Unspecified asthma, uncomplicated: Secondary | ICD-10-CM | POA: Diagnosis not present

## 2022-12-16 HISTORY — PX: FLEXIBLE SIGMOIDOSCOPY: SHX5431

## 2022-12-16 SURGERY — SIGMOIDOSCOPY, FLEXIBLE
Anesthesia: General

## 2022-12-16 MED ORDER — PROPOFOL 10 MG/ML IV BOLUS
INTRAVENOUS | Status: DC | PRN
Start: 2022-12-16 — End: 2022-12-16
  Administered 2022-12-16: 100 mg via INTRAVENOUS

## 2022-12-16 MED ORDER — LIDOCAINE HCL (CARDIAC) PF 100 MG/5ML IV SOSY
PREFILLED_SYRINGE | INTRAVENOUS | Status: DC | PRN
  Administered 2022-12-16: 50 mg via INTRAVENOUS

## 2022-12-16 MED ORDER — LACTATED RINGERS IV SOLN: INTRAVENOUS | Status: DC

## 2022-12-16 NOTE — Anesthesia Preprocedure Evaluation (Signed)
Anesthesia Evaluation  Patient identified by MRN, date of birth, ID band Patient awake    Reviewed: Allergy & Precautions, H&P , NPO status , Patient's Chart, lab work & pertinent test results, reviewed documented beta blocker date and time   Airway Mallampati: II  TM Distance: >3 FB Neck ROM: full    Dental no notable dental hx.    Pulmonary neg pulmonary ROS, asthma    Pulmonary exam normal breath sounds clear to auscultation       Cardiovascular Exercise Tolerance: Good hypertension, negative cardio ROS  Rhythm:regular Rate:Normal     Neuro/Psych negative neurological ROS  negative psych ROS   GI/Hepatic negative GI ROS, Neg liver ROS,,,  Endo/Other  negative endocrine ROS    Renal/GU negative Renal ROS  negative genitourinary   Musculoskeletal   Abdominal   Peds  Hematology negative hematology ROS (+) Blood dyscrasia, anemia   Anesthesia Other Findings   Reproductive/Obstetrics negative OB ROS                             Anesthesia Physical Anesthesia Plan  ASA: 2  Anesthesia Plan: General   Post-op Pain Management:    Induction:   PONV Risk Score and Plan: Propofol infusion  Airway Management Planned:   Additional Equipment:   Intra-op Plan:   Post-operative Plan:   Informed Consent: I have reviewed the patients History and Physical, chart, labs and discussed the procedure including the risks, benefits and alternatives for the proposed anesthesia with the patient or authorized representative who has indicated his/her understanding and acceptance.     Dental Advisory Given  Plan Discussed with: CRNA  Anesthesia Plan Comments:        Anesthesia Quick Evaluation

## 2022-12-16 NOTE — Transfer of Care (Signed)
Immediate Anesthesia Transfer of Care Note  Patient: Todd Richardson  Procedure(s) Performed: FLEXIBLE SIGMOIDOSCOPY  Patient Location: Short Stay  Anesthesia Type:General  Level of Consciousness: awake  Airway & Oxygen Therapy: Patient Spontanous Breathing  Post-op Assessment: Report given to RN and Post -op Vital signs reviewed and stable  Post vital signs: Reviewed and stable  Last Vitals:  Vitals Value Taken Time  BP    Temp    Pulse    Resp    SpO2      Last Pain:  Vitals:   12/16/22 0918  TempSrc:   PainSc: 0-No pain      Patients Stated Pain Goal: 5 (12/16/22 0820)  Complications: No notable events documented.

## 2022-12-16 NOTE — H&P (Signed)
Primary Care Physician:  Patient, No Pcp Per Primary Gastroenterologist:  Dr. Marletta Lor  Pre-Procedure History & Physical: HPI:  Todd Richardson is a 67 y.o. male is for a flexible sigmoidoscopy to evaluate stump prior to ostomy reversal, personal history of colon adenocarcinoma.  Past Medical History:  Diagnosis Date   Arthritis    Asthma    Vertigo     Past Surgical History:  Procedure Laterality Date   BIOPSY  07/31/2021   Procedure: BIOPSY;  Surgeon: Lanelle Bal, DO;  [facility];  Service: Endoscopy;;   CATARACT EXTRACTION Left    COLON RESECTION N/A 08/02/2021   Procedure: En bloc Resection of Colon and Small bowel;  Surgeon: Lucretia Roers, MD;  Location: [facility] ORS;  Service: General;  Laterality: N/A;   COLOSTOMY Right 08/02/2021   Procedure: ILEOSTOMY;  Surgeon: Lucretia Roers, MD;  Location: [facility] ORS;  Service: General;  Laterality: Right;   FLEXIBLE SIGMOIDOSCOPY  07/31/2021   Procedure: FLEXIBLE SIGMOIDOSCOPY;  Surgeon: Lanelle Bal, DO;  Location: [facility];  Service: Endoscopy;;   SUBMUCOSAL TATTOO INJECTION  07/31/2021   Procedure: SUBMUCOSAL TATTOO INJECTION;  Surgeon: Lanelle Bal, DO;  Location: [facility];  Service: Endoscopy;;   SUBTOTAL COLECTOMY     end ileostomy; sigmoid and cecal cancers    Prior to Admission medications   Medication Sig Start Date End Date Taking? Authorizing Provider  diphenhydrAMINE (BENADRYL) 25 mg capsule Take 25 mg by mouth as needed.   Yes [provider]  Loperamide HCl (IMODIUM PO) Take 1 tablet by mouth 4 (four) times daily.   Yes [provider]  psyllium (HYDROCIL/METAMUCIL) 95 % PACK Take 1 packet by mouth daily. 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    Family History  Problem Relation Age of Onset   Hypertension Mother    Diabetes Mother    Parkinson's disease Father    Dementia Father     Social History   Socioeconomic History   Marital status: Single    Spouse name: Not on file   Number of children: 0   Years of education: 12+1   Highest education level: Some college, no degree  Occupational History   Not on file  Tobacco Use   Smoking status: Never   Smokeless tobacco: Never  Vaping Use   Vaping status: Never Used  Substance and Sexual Activity   Alcohol use: Not Currently    Alcohol/week: 4.0 standard drinks of alcohol    Types: 4 Cans of beer per week    Comment: every day-none in 6 months or more   Drug use: Never   Sexual activity: Not Currently  Other Topics Concern   Not on file  Social History Narrative   Not on file   Social Determinants of Health   Financial Resource Strain: Not on file  Food Insecurity: Not on file  Transportation Needs: Not on file  Physical Activity: Not on file  Stress: No Stress Concern Present (08/31/2021)   [employer] of Occupational Health - Occupational Stress Questionnaire  Feeling of Stress : Not at all  Social Connections: Not on file  Intimate Partner Violence: Not At Risk (08/31/2021)   Humiliation, Afraid, Rape, and Kick questionnaire    Fear of Current or Ex-Partner: No    Emotionally Abused: No    Physically Abused: No    Sexually Abused: No    Review of Systems: See HPI, otherwise negative ROS  Physical Exam: Vital signs in last 24 hours: Temp:  [98.5 F (36.9 C)] 98.5 F (36.9 C) (08/19 0820) Resp:  [16] 16 (08/19 0820) BP: (93)/(73) 93/73 (08/19 0820) SpO2:  [100 %] 100 % (08/19 0820) Weight:  [78 kg] 78 kg (08/19 0820)   General:   Alert,  Well-developed, well-nourished,  pleasant and cooperative in NAD Head:  Normocephalic and atraumatic. Eyes:  Sclera clear, no icterus.   Conjunctiva pink. Ears:  Normal auditory acuity. Nose:  No deformity, discharge,  or lesions. Msk:  Symmetrical without gross deformities. Normal posture. Extremities:  Without clubbing or edema. Neurologic:  Alert and  oriented x4;  grossly normal neurologically. Skin:  Intact without significant lesions or rashes. Psych:  Alert and cooperative. Normal mood and affect.  Impression/Plan: Todd Richardson is here for a flexible sigmoidoscopy to evaluate stump prior to ostomy reversal, personal history of colon adenocarcinoma.  The risks of the procedure including infection, bleed, or perforation as well as benefits, limitations, alternatives and imponderables have been reviewed with the patient. Questions have been answered. All parties agreeable.

## 2022-12-16 NOTE — Anesthesia Procedure Notes (Signed)
Date/Time: 12/16/2022 9:21 AM  Performed by: Julian Reil, CRNAPre-anesthesia Checklist: Patient identified, Emergency Drugs available, Suction available and Patient being monitored Patient Re-evaluated:Patient Re-evaluated prior to induction Oxygen Delivery Method: Nasal cannula Induction Type: IV induction Placement Confirmation: positive ETCO2

## 2022-12-16 NOTE — Discharge Instructions (Signed)
  Flexible sigmoidoscopy Discharge Instructions  Read the instructions outlined below and refer to this sheet in the next few weeks. These discharge instructions provide you with general information on caring for yourself after you leave the hospital. Your doctor may also give you specific instructions. While your treatment has been planned according to the most current medical practices available, unavoidable complications occasionally occur.   ACTIVITY You may resume your regular activity, but move at a slower pace for the next 24 hours.  Take frequent rest periods for the next 24 hours.  Walking will help get rid of the air and reduce the bloated feeling in your belly (abdomen).  No driving for 24 hours (because of the medicine (anesthesia) used during the test).   Do not sign any important legal documents or operate any machinery for 24 hours (because of the anesthesia used during the test).  NUTRITION Drink plenty of fluids.  You may resume your normal diet as instructed by your doctor.  Begin with a light meal and progress to your normal diet. Heavy or fried foods are harder to digest and may make you feel sick to your stomach (nauseated).  Avoid alcoholic beverages for 24 hours or as instructed.  MEDICATIONS You may resume your normal medications unless your doctor tells you otherwise.  WHAT YOU CAN EXPECT TODAY Some feelings of bloating in the abdomen.  Passage of more gas than usual.  Spotting of blood in your stool or on the toilet paper.  IF YOU HAD POLYPS REMOVED DURING THE COLONOSCOPY: No aspirin products for 7 days or as instructed.  No alcohol for 7 days or as instructed.  Eat a soft diet for the next 24 hours.  FINDING OUT THE RESULTS OF YOUR TEST Not all test results are available during your visit. If your test results are not back during the visit, make an appointment with your caregiver to find out the results. Do not assume everything is normal if you have not heard  from your caregiver or the medical facility. It is important for you to follow up on all of your test results.  SEEK IMMEDIATE MEDICAL ATTENTION IF: You have more than a spotting of blood in your stool.  Your belly is swollen (abdominal distention).  You are nauseated or vomiting.  You have a temperature over 101.  You have abdominal pain or discomfort that is severe or gets worse throughout the day.   Overall your rectal stump looked healthy.  No recurrence of cancer.  I discussed my findings with Dr. Henreitta Leber.  Cleared to have your ostomy reversed from GI standpoint.  We will tentatively plan on colonoscopy 1 year after your corrective surgery.  I hope you have a great rest of your week!  Hennie Duos. Marletta Lor, D.O. Gastroenterology and Hepatology [facility]\

## 2022-12-16 NOTE — Op Note (Signed)
[facility] Patient Name: Todd Richardson Procedure Date: 12/16/2022 9:12 AM MRN: [id number] Date of Birth: Sep 20, 1955 Attending MD: Hennie Duos. Marletta Lor , [place], [id number] CSN: [id number] Age: 67 Admit Type: Outpatient Procedure:                Flexible Sigmoidoscopy Indications:              High risk colon cancer surveillance: Personal                            history of colon cancer Providers:                Hennie Duos. Marletta Lor, DO, Crystal Page, Durwin Glaze Tech, Technician Referring MD:              Medicines:                See the Anesthesia note for documentation of the                            administered medications Complications:            No immediate complications. Estimated Blood Loss:     Estimated blood loss was minimal. Procedure:                Pre-Anesthesia Assessment:                           - The anesthesia plan was to use monitored                            anesthesia care (MAC).                           After obtaining informed consent, the scope was                            passed under direct vision. The [id number]-[id number]                            ([id number]) scope was introduced through the anus and                            advanced to the the rectum. The flexible                            sigmoidoscopy was accomplished without difficulty.                            The patient tolerated the procedure well. The                            quality of the bowel preparation was good. Scope In: 9:22:49 AM Scope Out: 9:24:44 AM Total Procedure Duration: 0 hours 1 minute 55 seconds  Findings:      Rectal stump evaluated, measured approx. 11-12 cm in length.  Evidence of       barotrauma. No evidence of maliganancy. Impression:               - No specimens collected. Moderate Sedation:      Per Anesthesia Care Recommendation:           - Patient has a contact number available for                            emergencies. The signs  and symptoms of potential                            delayed complications were discussed with the                            patient. Return to normal activities tomorrow.                            Written discharge instructions were provided to the                            patient.                           - Resume previous diet.                           - Continue present medications.                           - I discussed findings with Dr. Henreitta Leber. Repeat                            colonoscopy in 1 year after ostomy reversal. Procedure Code(s):        --- Professional ---                           N6295, 52, Colorectal cancer screening; flexible                            sigmoidoscopy Diagnosis Code(s):        --- Professional ---                           M84.132, Personal history of other malignant                            neoplasm of large intestine CPT copyright 2022 American Medical Association. All rights reserved. The codes documented in this report are preliminary and upon coder review may  be revised to meet current compliance requirements. Hennie Duos. Marletta Lor, DO Hennie Duos. Marletta Lor, DO 12/16/2022 9:32:28 AM This report has been signed electronically. Number of Addenda: 0

## 2022-12-17 NOTE — Anesthesia Postprocedure Evaluation (Signed)
Anesthesia Post Note  Patient: Todd Richardson  Procedure(s) Performed: FLEXIBLE SIGMOIDOSCOPY  Patient location during evaluation: Phase II Anesthesia Type: General Level of consciousness: awake Pain management: pain level controlled Vital Signs Assessment: post-procedure vital signs reviewed and stable Respiratory status: spontaneous breathing and respiratory function stable Cardiovascular status: blood pressure returned to baseline and stable Postop Assessment: no headache and no apparent nausea or vomiting Anesthetic complications: no Comments: Late entry   No notable events documented.   Last Vitals:  Vitals:   12/16/22 0928 12/16/22 0931  BP: (!) 88/48 101/69  Pulse: 68   Resp: 16   Temp: 36.7 C   SpO2: 100%     Last Pain:  Vitals:   12/16/22 0931  TempSrc:   PainSc: 0-No pain                 [name]

## 2022-12-19 ENCOUNTER — Encounter (HOSPITAL_COMMUNITY): Payer: Self-pay | Admitting: Internal Medicine

## 2023-01-03 DIAGNOSIS — Z932 Ileostomy status: Secondary | ICD-10-CM | POA: Diagnosis not present

## 2023-01-03 DIAGNOSIS — Z85038 Personal history of other malignant neoplasm of large intestine: Secondary | ICD-10-CM | POA: Diagnosis not present

## 2023-01-26 DIAGNOSIS — R030 Elevated blood-pressure reading, without diagnosis of hypertension: Secondary | ICD-10-CM | POA: Diagnosis not present

## 2023-01-26 DIAGNOSIS — L209 Atopic dermatitis, unspecified: Secondary | ICD-10-CM | POA: Diagnosis not present

## 2023-01-26 DIAGNOSIS — L309 Dermatitis, unspecified: Secondary | ICD-10-CM | POA: Diagnosis not present

## 2023-01-26 DIAGNOSIS — L500 Allergic urticaria: Secondary | ICD-10-CM | POA: Diagnosis not present

## 2023-01-26 DIAGNOSIS — L239 Allergic contact dermatitis, unspecified cause: Secondary | ICD-10-CM | POA: Diagnosis not present

## 2023-01-26 DIAGNOSIS — R21 Rash and other nonspecific skin eruption: Secondary | ICD-10-CM | POA: Diagnosis not present

## 2023-01-29 ENCOUNTER — Ambulatory Visit: Payer: Medicare Other | Admitting: General Surgery

## 2023-02-04 ENCOUNTER — Encounter (HOSPITAL_COMMUNITY): Payer: Self-pay | Admitting: Radiology

## 2023-02-04 ENCOUNTER — Ambulatory Visit (HOSPITAL_COMMUNITY)
Admission: RE | Admit: 2023-02-04 | Discharge: 2023-02-04 | Disposition: A | Discharge status: Home / Self Care | Payer: Medicare Other | Source: Ambulatory Visit | Attending: Hematology | Admitting: Hematology

## 2023-02-04 ENCOUNTER — Inpatient Hospital Stay: Payer: Medicare Other | Attending: Hematology

## 2023-02-04 DIAGNOSIS — Z85038 Personal history of other malignant neoplasm of large intestine: Secondary | ICD-10-CM | POA: Insufficient documentation

## 2023-02-04 DIAGNOSIS — D500 Iron deficiency anemia secondary to blood loss (chronic): Secondary | ICD-10-CM | POA: Insufficient documentation

## 2023-02-04 DIAGNOSIS — D631 Anemia in chronic kidney disease: Secondary | ICD-10-CM | POA: Diagnosis not present

## 2023-02-04 DIAGNOSIS — Z9049 Acquired absence of other specified parts of digestive tract: Secondary | ICD-10-CM | POA: Diagnosis not present

## 2023-02-04 DIAGNOSIS — N189 Chronic kidney disease, unspecified: Secondary | ICD-10-CM | POA: Diagnosis not present

## 2023-02-04 DIAGNOSIS — K4090 Unilateral inguinal hernia, without obstruction or gangrene, not specified as recurrent: Secondary | ICD-10-CM | POA: Diagnosis not present

## 2023-02-04 DIAGNOSIS — C19 Malignant neoplasm of rectosigmoid junction: Secondary | ICD-10-CM | POA: Insufficient documentation

## 2023-02-04 DIAGNOSIS — K828 Other specified diseases of gallbladder: Secondary | ICD-10-CM | POA: Diagnosis not present

## 2023-02-04 LAB — COMPREHENSIVE METABOLIC PANEL
ALT: 63 U/L — ABNORMAL HIGH (ref 0–44)
AST: 45 U/L — ABNORMAL HIGH (ref 15–41)
Albumin: 3.9 g/dL (ref 3.5–5.0)
Alkaline Phosphatase: 74 U/L (ref 38–126)
Anion gap: 9 (ref 5–15)
BUN: 18 mg/dL (ref 8–23)
CO2: 23 mmol/L (ref 22–32)
Calcium: 9.2 mg/dL (ref 8.9–10.3)
Chloride: 103 mmol/L (ref 98–111)
Creatinine, Ser: 1.19 mg/dL (ref 0.61–1.24)
GFR, Estimated: >60 mL/min (ref >60)
Glucose, Bld: 96 mg/dL (ref 70–99)
Potassium: 3.6 mmol/L (ref 3.5–5.1)
Sodium: 135 mmol/L (ref 135–145)
Total Bilirubin: 0.9 mg/dL (ref 0.3–1.2)
Total Protein: 7 g/dL (ref 6.5–8.1)

## 2023-02-04 LAB — CBC WITH DIFFERENTIAL/PLATELET
Abs Immature Granulocytes: 0.1 10*3/uL — ABNORMAL HIGH (ref 0.00–0.07)
Basophils Absolute: 0 10*3/uL (ref 0.0–0.1)
Basophils Relative: 1 %
Eosinophils Absolute: 0.5 10*3/uL (ref 0.0–0.5)
Eosinophils Relative: 6 %
HCT: 38.2 % — ABNORMAL LOW (ref 39.0–52.0)
Hemoglobin: 13 g/dL (ref 13.0–17.0)
Immature Granulocytes: 1 %
Lymphocytes Relative: 17 %
Lymphs Abs: 1.4 10*3/uL (ref 0.7–4.0)
MCH: 35.2 pg — ABNORMAL HIGH (ref 26.0–34.0)
MCHC: 34 g/dL (ref 30.0–36.0)
MCV: 103.5 fL — ABNORMAL HIGH (ref 80.0–100.0)
Monocytes Absolute: 0.6 10*3/uL (ref 0.1–1.0)
Monocytes Relative: 7 %
Neutro Abs: 5.6 10*3/uL (ref 1.7–7.7)
Neutrophils Relative %: 68 %
Platelets: 274 10*3/uL (ref 150–400)
RBC: 3.69 MIL/uL — ABNORMAL LOW (ref 4.22–5.81)
RDW: 12.3 % (ref 11.5–15.5)
WBC: 8.2 10*3/uL (ref 4.0–10.5)
nRBC: 0 % (ref 0.0–0.2)

## 2023-02-04 LAB — FERRITIN: Ferritin: 404 ng/mL — ABNORMAL HIGH (ref 24–336)

## 2023-02-04 LAB — IRON AND TIBC
Iron: 105 ug/dL (ref 45–182)
Saturation Ratios: 28 % (ref 17.9–39.5)
TIBC: 371 ug/dL (ref 250–450)
UIBC: 266 ug/dL

## 2023-02-04 MED ORDER — IOHEXOL 300 MG/ML  SOLN
100.0000 mL | Freq: Once | INTRAMUSCULAR | Status: AC | PRN
  Administered 2023-02-04: 100 mL via INTRAVENOUS

## 2023-02-05 LAB — CEA: CEA: 1.3 ng/mL (ref 0.0–4.7)

## 2023-02-11 ENCOUNTER — Inpatient Hospital Stay: Payer: Medicare Other | Admitting: Hematology

## 2023-02-11 VITALS — BP 130/90 | HR 100 | Temp 98.5°F | Resp 16 | Wt 174.4 lb

## 2023-02-11 DIAGNOSIS — N189 Chronic kidney disease, unspecified: Secondary | ICD-10-CM | POA: Diagnosis not present

## 2023-02-11 DIAGNOSIS — Z85038 Personal history of other malignant neoplasm of large intestine: Secondary | ICD-10-CM | POA: Diagnosis not present

## 2023-02-11 DIAGNOSIS — C19 Malignant neoplasm of rectosigmoid junction: Secondary | ICD-10-CM | POA: Diagnosis not present

## 2023-02-11 DIAGNOSIS — D500 Iron deficiency anemia secondary to blood loss (chronic): Secondary | ICD-10-CM | POA: Diagnosis not present

## 2023-02-11 DIAGNOSIS — D631 Anemia in chronic kidney disease: Secondary | ICD-10-CM | POA: Diagnosis not present

## 2023-02-11 DIAGNOSIS — C180 Malignant neoplasm of cecum: Secondary | ICD-10-CM | POA: Diagnosis not present

## 2023-02-11 NOTE — Patient Instructions (Signed)
[facility]  Discharge Instructions  You were seen and examined today by Dr. Ellin Saba.  Dr. Ellin Saba discussed your most recent lab work and CT scan which revealed that everything looks good and stable.  Follow-up as scheduled in 3 months.    Thank you for choosing [facility] to provide your oncology and hematology care.   To afford each patient quality time with our provider, please arrive at least 15 minutes before your scheduled appointment time. You may need to reschedule your appointment if you arrive late (10 or more minutes). Arriving late affects you and other patients whose appointments are after yours.  Also, if you miss three or more appointments without notifying the office, you may be dismissed from the clinic at the provider's discretion.    Again, thank you for choosing [facility].  Our hope is that these requests will decrease the amount of time that you wait before being seen by our physicians.   If you have a lab appointment with the Cancer Center - please note that after April 8th, all labs will be drawn in the cancer center.  You do not have to check in or register with the main entrance as you have in the past but will complete your check-in at the cancer center.            _____________________________________________________________  Should you have questions after your visit to [facility], please contact our office at [phone] and follow the prompts.  Our office hours are 8:00 a.m. to 4:30 p.m. Monday - Thursday and 8:00 a.m. to 2:30 p.m. Friday.  Please note that voicemails left after 4:00 p.m. may not be returned until the following business day.  We are closed weekends and all major holidays.  You do have access to a nurse 24-7, just call the main number to the clinic [phone] and do not press any options, hold on the line and a nurse will answer the phone.    For  prescription refill requests, have your pharmacy contact our office and allow 72 hours.    Masks are no longer required in the cancer centers. If you would like for your care team to wear a mask while they are taking care of you, please let them know. You may have one support person who is at least 66 years old accompany you for your appointments.

## 2023-02-11 NOTE — Progress Notes (Signed)
[facility] 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  Total examined lymph nodes 35.  MMR-preserved. - CT CAP on 07/31/2021 with no evidence of metastatic disease. - Xeloda in the adjuvant setting from 09/05/2021 through 03/25/2022    Social/family history: - He lives by himself.  He is a retired Personnel officer.  No exposure to asbestos.  Non-smoker. - Father had colon cancer in his 23s.  Maternal uncle had colon cancer.  Plan: T4N0 sigmoid colon cancer: - Denies any change in bowel habit.  No bleeding per rectum or melena. - Reviewed labs from 02/04/2023: AST and ALT elevated at 4763.  CBC grossly normal.  CEA was 1.3. - CTAP (02/04/2023): No evidence of recurrence or metastatic disease.  Prominent lymph nodes in the sigmoid mesentery have decreased in size now measuring 4 mm. - Not taking Tylenol on a regular basis.  Drinks beer 3-4 times per week. - Recommend follow-up in 3 months with repeat labs.  2.  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 - Last Feraheme on 08/16/2022.  Ferritin is 404 and percent saturation 28.  Hemoglobin improved to 13.  3.  CKD: - Creatinine has been elevated since 11/26/2021.  Latest creatinine is normal at 1.19.  He missed appointment with Dr. Wolfgang Phoenix.    Orders Placed This Encounter  Procedures   CEA     Standing Status:   Future    Standing Expiration Date:   02/11/2024   CBC with Differential/Platelet    Standing Status:   Future    Standing Expiration Date:   02/11/2024    Order Specific Question:   Release to patient    Answer:   Immediate   Comprehensive metabolic panel    Standing Status:   Future    Standing Expiration Date:   02/11/2024    Order Specific Question:   Release to patient    Answer:   Immediate   Ferritin    Standing Status:   Future    Standing Expiration Date:   02/11/2024    Order Specific Question:   Release to patient    Answer:   Immediate   Iron and TIBC    Standing Status:   Future    Standing Expiration Date:   02/11/2024    Order Specific Question:   Release to patient    Answer:   Immediate       I,Helena R Teague,acting as a scribe for Doreatha Massed, MD.,have documented all relevant documentation on the behalf of Doreatha Massed, MD,as directed by  Doreatha Massed, MD while in the presence of Doreatha Massed, MD.  I, Doreatha Massed MD, have reviewed the above documentation for accuracy and completeness, and I agree with the above.     Doreatha Massed, MD   10/15/20244:39 PM  CHIEF COMPLAINT:   Diagnosis: rectosigmoid cancer    Cancer Staging  Cancer of rectosigmoid (colon) (HCC) Staging form:  Colon and Rectum, AJCC 8th Edition - Clinical stage from 07/31/2021: Stage IIB (cT4a(2), cN0, cM0) - Signed by Dellia Beckwith, MD on 09/01/2021  Cecal cancer [facility]) Staging form: Colon and Rectum, AJCC 8th Edition - Clinical stage from 07/31/2021: Stage IIA (cT3(2), cN0, cM0) - Signed by Dellia Beckwith, MD on 09/01/2021    Prior Therapy: none  Current Therapy:  Xeloda   HISTORY OF PRESENT ILLNESS:   Oncology History  Cecal cancer (HCC)  07/31/2021 Cancer Staging   Staging form: Colon and Rectum, AJCC 8th Edition - Clinical stage from 07/31/2021: Stage IIA (cT3(2), cN0, cM0) - Signed by Dellia Beckwith, MD on 09/01/2021 Histopathologic type: Adenocarcinoma, NOS Stage prefix: Initial diagnosis Total positive nodes: 0 Total nodes examined: 35 Histologic grade (G): G2 Histologic grading system: 4 grade system Laterality: Right Bilateral cancer: Yes Tumor size (mm): 74 Multiple tumors: Yes Number of tumors: 2 Lymph-vascular invasion (LVI): LVI not present (absent)/not identified Diagnostic confirmation: Positive histology Specimen type: Excision Staged by: Managing physician Tumor deposits (TD): Absent Perineural invasion (PNI): Absent Microsatellite instability (MSI): Stable KRAS mutation: Not assessed NRAS mutation: Not assessed BRAF mutation: Not assessed Stage used in treatment planning: Yes National guidelines used in treatment planning: Yes Type of national guideline used in treatment planning: NCCN   08/28/2021 Initial Diagnosis   Cecal cancer (HCC)   Cancer of rectosigmoid (colon) (HCC)  07/31/2021 Initial Diagnosis   Cancer of rectosigmoid (colon) (HCC)   07/31/2021 Cancer Staging   Staging form: Colon and Rectum, AJCC 8th Edition - Clinical stage from 07/31/2021: Stage IIB (cT4a(2), cN0, cM0) - Signed by Dellia Beckwith, MD on 09/01/2021 Histopathologic type: Adenocarcinoma, NOS Stage prefix: Initial diagnosis Total positive nodes: 0 Total nodes examined: 35 Histologic grade (G): G2 Histologic grading system: 4 grade system Laterality: Left Bilateral cancer: Yes Tumor size (mm): 83 Multiple tumors: Yes Number of tumors: 2 Lymph-vascular invasion (LVI): LVI not present (absent)/not identified Diagnostic confirmation: Positive histology Specimen type: Excision Staged by: Managing physician Tumor deposits (TD): Absent Carcinoembryonic antigen (CEA) (ng/mL): 1.6 Perineural invasion (PNI): Absent Microsatellite instability (MSI): Stable KRAS mutation: Not assessed NRAS mutation: Not assessed BRAF mutation: Not assessed Stage used in treatment planning:  Yes National guidelines used in treatment planning: Yes Type of national guideline used in treatment planning: NCCN   09/12/2021 - 09/12/2021 Chemotherapy   Patient is on Treatment Plan : COLORECTAL Capecitabine q21d        INTERVAL HISTORY:   Todd Richardson is a 67 y.o. male presenting to clinic today for follow up of rectosigmoid cancer. He was last seen by me on 11/04/22.  Since his last visit, he underwent a sigmoidoscopy on 12/16/22 with Dr. Marletta Lor. He also had a CT A/P on 02/04/23 that found: s/p subtotal colectomy with no evidence of local recurrence or metastatic disease in the abdomen or pelvis and prominent lymph nodes in the sigmoid mesentery have decreased in size now measuring up to 4 mm.  Today, he states that he is doing okay overall. His appetite level is at 75%. His energy level is at 80%.  He denies any changes in BM's, BRBPR, or consistent melena. He occasionally has right lower back pain, once every 2-3 weeks. He has not taken iron supplements for 3 weeks and has 1 beer 3-4 times a week. Patient received a call from Dr. Lucio Edward office but was not able to answer the phone.   PAST MEDICAL HISTORY:   Past Medical History: Past Medical History:  Diagnosis  Date   Arthritis    Asthma    Vertigo     Surgical History: Past Surgical History:  Procedure Laterality Date   BIOPSY  07/31/2021   Procedure: BIOPSY;  Surgeon: Lanelle Bal, DO;  Location: [facility];  Service: Endoscopy;;   CATARACT EXTRACTION Left    COLON RESECTION N/A 08/02/2021   Procedure: En bloc Resection of Colon and Small bowel;  Surgeon: Lucretia Roers, MD;  Location: [facility];  Service: General;  Laterality: N/A;   COLOSTOMY Right 08/02/2021   Procedure: ILEOSTOMY;  Surgeon: Lucretia Roers, MD;  Location: [facility];  Service: General;  Laterality: Right;   FLEXIBLE SIGMOIDOSCOPY  07/31/2021   Procedure: FLEXIBLE SIGMOIDOSCOPY;  Surgeon: Lanelle Bal, DO;  Location: [facility];  Service: Endoscopy;;    FLEXIBLE SIGMOIDOSCOPY N/A 12/16/2022   Procedure: FLEXIBLE SIGMOIDOSCOPY;  Surgeon: Lanelle Bal, DO;  Location: [facility];  Service: Endoscopy;  Laterality: N/A;  945AM, ASA 3   SUBMUCOSAL TATTOO INJECTION  07/31/2021   Procedure: SUBMUCOSAL TATTOO INJECTION;  Surgeon: Lanelle Bal, DO;  Location: [facility];  Service: Endoscopy;;   SUBTOTAL COLECTOMY     end ileostomy; sigmoid and cecal cancers    Social History: Social History   Socioeconomic History   Marital status: Single    Spouse name: Not on file   Number of children: 0   Years of education: 12+1   Highest education level: Some college, no degree  Occupational History   Not on file  Tobacco Use   Smoking status: Never   Smokeless tobacco: Never  Vaping Use   Vaping status: Never Used  Substance and Sexual Activity   Alcohol use: Not Currently    Alcohol/week: 4.0 standard drinks of alcohol    Types: 4 Cans of beer per week    Comment: every day-none in 6 months or more   Drug use: Never   Sexual activity: Not Currently  Other Topics Concern   Not on file  Social History Narrative   Not on file   Social Determinants of Health   Financial Resource Strain: Not on file  Food Insecurity: Not on file  Transportation Needs: Not on file  Physical Activity: Not on file  Stress: No Stress Concern Present (08/31/2021)   [employer] of Occupational Health - Occupational Stress Questionnaire    Feeling of Stress : Not at all  Social Connections: Not on file  Intimate Partner Violence: Not At Risk (08/31/2021)   Humiliation, Afraid, Rape, and Kick questionnaire    Fear of Current or Ex-Partner: No    Emotionally Abused: No    Physically Abused: No    Sexually Abused: No    Family History: Family History  Problem Relation Age of Onset   Hypertension Mother    Diabetes Mother    Parkinson's disease Father    Dementia Father     Current Medications:  Current Outpatient Medications:     diphenhydrAMINE (BENADRYL) 25 mg capsule, Take 25 mg by mouth as needed., Disp: , Rfl:    Loperamide HCl (IMODIUM PO), Take 1 tablet by mouth 4 (four) times daily., Disp: , Rfl:    methylPREDNISolone (MEDROL DOSEPAK) 4 MG TBPK tablet, Dose pack standard taper, Disp: 21 each, Rfl: 0   psyllium (HYDROCIL/METAMUCIL) 95 % PACK, Take 1 packet by mouth daily., Disp: 240 each, Rfl: 2   Allergies: Allergies  Allergen Reactions   Apple Anaphylaxis    Other reaction(s): Unknown  Apple Juice Anaphylaxis    Throat swelling     REVIEW OF SYSTEMS:   Review of Systems  Constitutional:  Negative for chills, fatigue and fever.  HENT:   Negative for lump/mass, mouth sores, nosebleeds, sore throat and trouble swallowing.   Eyes:  Negative for eye problems.  Respiratory:  Negative for cough and shortness of breath.   Cardiovascular:  Negative for chest pain, leg swelling and palpitations.  Gastrointestinal:  Negative for abdominal pain, constipation, diarrhea, nausea and vomiting.  Genitourinary:  Negative for bladder incontinence, difficulty urinating, dysuria, frequency, hematuria and nocturia.   Musculoskeletal:  Positive for arthralgias (of the right knee, 5/10 severity). Negative for back pain, flank pain, myalgias and neck pain.  Skin:  Negative for itching and rash.  Neurological:  Positive for headaches. Negative for dizziness and numbness.  Hematological:  Does not bruise/bleed easily.  Psychiatric/Behavioral:  Negative for depression, sleep disturbance and suicidal ideas. The patient is not nervous/anxious.   All other systems reviewed and are negative.    VITALS:   Blood pressure (!) 130/90, pulse 100, temperature 98.5 F (36.9 C), temperature source Oral, resp. rate 16, weight 174 lb 6.4 oz (79.1 kg), SpO2 100%.  Wt Readings from Last 3 Encounters:  02/11/23 174 lb 6.4 oz (79.1 kg)  12/16/22 171 lb 15.3 oz (78 kg)  12/03/22 172 lb (78 kg)    Body mass index is 27.31  kg/m.  Performance status (ECOG): 1 - Symptomatic but completely ambulatory  PHYSICAL EXAM:   Physical Exam Vitals and nursing note reviewed. Exam conducted with a chaperone present.  Constitutional:      Appearance: Normal appearance.  Cardiovascular:     Rate and Rhythm: Normal rate and regular rhythm.     Pulses: Normal pulses.     Heart sounds: Normal heart sounds.  Pulmonary:     Effort: Pulmonary effort is normal.     Breath sounds: Normal breath sounds.  Abdominal:     Palpations: Abdomen is soft. There is no hepatomegaly, splenomegaly or mass.     Tenderness: There is no abdominal tenderness.  Musculoskeletal:     Right lower leg: No edema.     Left lower leg: No edema.  Lymphadenopathy:     Cervical: No cervical adenopathy.     Right cervical: No superficial, deep or posterior cervical adenopathy.    Left cervical: No superficial, deep or posterior cervical adenopathy.     Upper Body:     Right upper body: No supraclavicular or axillary adenopathy.     Left upper body: No supraclavicular or axillary adenopathy.  Neurological:     General: No focal deficit present.     Mental Status: He is alert and oriented to person, place, and time.  Psychiatric:        Mood and Affect: Mood normal.        Behavior: Behavior normal.     LABS:      Latest Ref Rng & Units 02/04/2023   12:17 PM 10/28/2022    2:53 PM 07/31/2022    8:16 AM  CBC  WBC 4.0 - 10.5 K/uL 8.2  6.2  7.1   Hemoglobin 13.0 - 17.0 g/dL 16.1  09.6  04.5   Hematocrit 39.0 - 52.0 % 38.2  36.0  32.1   Platelets 150 - 400 K/uL 274  216  314       Latest Ref Rng & Units 02/04/2023   12:17 PM 10/28/2022    2:53 PM 07/31/2022  8:16 AM  CMP  Glucose 70 - 99 mg/dL 96   010   BUN 8 - 23 mg/dL 18   22   Creatinine 2.72 - 1.24 mg/dL 5.36   6.44   Sodium 034 - 145 mmol/L 135   137   Potassium 3.5 - 5.1 mmol/L 3.6   3.9   Chloride 98 - 111 mmol/L 103   105   CO2 22 - 32 mmol/L 23   23   Calcium 8.9 - 10.3 mg/dL  9.2   9.1   Total Protein 6.5 - 8.1 g/dL 7.0  7.5  7.2   Total Bilirubin 0.3 - 1.2 mg/dL 0.9  0.6  0.6   Alkaline Phos 38 - 126 U/L 74  83  100   AST 15 - 41 U/L 45  25  26   ALT 0 - 44 U/L 63  21  24      Lab Results  Component Value Date   CEA1 1.3 02/04/2023   /  CEA  Date Value Ref Range Status  02/04/2023 1.3 0.0 - 4.7 ng/mL Final    Comment:    (NOTE)                             Nonsmokers          <3.9                             Smokers             <5.6 [employer] Electrochemiluminescence Immunoassay (ECLIA) Values obtained with different assay methods or kits cannot be used interchangeably.  Results cannot be interpreted as absolute evidence of the presence or absence of malignant disease. Performed At: [facility] [address] [place], [place] [id number] Jolene Schimke MD VF:[id number]    No results found for: "PSA1" No results found for: "CAN199" No results found for: "CAN125"  No results found for: "TOTALPROTELP", "ALBUMINELP", "A1GS", "A2GS", "BETS", "BETA2SER", "GAMS", "MSPIKE", "SPEI" Lab Results  Component Value Date   TIBC 371 02/04/2023   TIBC 444 03/27/2022   TIBC 389 02/18/2022   FERRITIN 404 (H) 02/04/2023   FERRITIN 93 03/27/2022   FERRITIN 198 02/18/2022   IRONPCTSAT 28 02/04/2023   IRONPCTSAT 26 03/27/2022   IRONPCTSAT 15 (L) 02/18/2022   No results found for: "LDH"   STUDIES:   CT Abdomen Pelvis W Contrast  Result Date: 02/04/2023 CLINICAL DATA:  History of sigmoid colon cancer, monitor. * Tracking Code: BO * EXAM: CT ABDOMEN AND PELVIS WITH CONTRAST TECHNIQUE: Multidetector CT imaging of the abdomen and pelvis was performed using the standard protocol following bolus administration of intravenous contrast. RADIATION DOSE REDUCTION: This exam was performed according to the departmental dose-optimization program which includes automated exposure control, adjustment of the mA and/or kV according to patient size and/or use  of iterative reconstruction technique. CONTRAST:  OMNIPAQUE IOHEXOL 300 MG/ML  SOLN COMPARISON:  Multiple priors including most recent CT May 01, 2022 FINDINGS: Lower chest: No acute abnormality. Hepatobiliary: No suspicious hepatic lesion. Gallbladder is unremarkable. No biliary ductal dilation. Pancreas: No pancreatic ductal dilation or evidence of acute inflammation. Spleen: No splenomegaly. Adrenals/Urinary Tract: Bilateral adrenal glands appear normal. No hydronephrosis. Kidneys demonstrate symmetric enhancement. Urinary bladder is unremarkable for degree of distension. Stomach/Bowel: Radiopaque enteric contrast material traverses the right lower quadrant ileostomy. Status post subtotal colectomy with Hartmann's pouch  and ostomy formation. No suspicious nodularity along the Hartmann's pouch suture line. No pathologic dilation of small or large bowel no evidence of acute bowel inflammation. Vascular/Lymphatic: Aortic atherosclerosis. Smooth IVC contours. No pathologically enlarged abdominal or pelvic lymph nodes. Prominent lymph nodes in the sigmoid mesentery have decreased in size now measuring up to 4 mm on image 58/2 previously 7 mm. Reproductive: Prostate is unremarkable. Other: Large right peristomal hernia. Similar soft tissue nodule in the subcutaneous left flank measuring 7 mm on image 61/2. Fat containing left inguinal hernia. Musculoskeletal: No aggressive lytic or blastic lesion of bone. Severe right hip DJD again noted. Multilevel degenerative changes spine. Mild left hip DJD. IMPRESSION: 1. Status post subtotal colectomy with no evidence of local recurrence or metastatic disease in the abdomen or pelvis. 2. Prominent lymph nodes in the sigmoid mesentery have decreased in size now measuring up to 4 mm. 3.  Aortic Atherosclerosis (ICD10-I70.0). Electronically Signed   By: Maudry Mayhew M.D.   On: 02/04/2023 15:29

## 2023-02-18 ENCOUNTER — Ambulatory Visit: Payer: Medicare Other | Admitting: General Surgery

## 2023-04-17 LAB — MOLECULAR PATHOLOGY

## 2023-05-14 ENCOUNTER — Inpatient Hospital Stay: Payer: Medicare Other | Attending: Hematology

## 2023-05-14 DIAGNOSIS — D631 Anemia in chronic kidney disease: Secondary | ICD-10-CM | POA: Diagnosis not present

## 2023-05-14 DIAGNOSIS — C180 Malignant neoplasm of cecum: Secondary | ICD-10-CM

## 2023-05-14 DIAGNOSIS — N189 Chronic kidney disease, unspecified: Secondary | ICD-10-CM | POA: Insufficient documentation

## 2023-05-14 DIAGNOSIS — Z85038 Personal history of other malignant neoplasm of large intestine: Secondary | ICD-10-CM | POA: Insufficient documentation

## 2023-05-14 DIAGNOSIS — D500 Iron deficiency anemia secondary to blood loss (chronic): Secondary | ICD-10-CM

## 2023-05-14 DIAGNOSIS — C19 Malignant neoplasm of rectosigmoid junction: Secondary | ICD-10-CM

## 2023-05-14 LAB — CBC WITH DIFFERENTIAL/PLATELET
Abs Immature Granulocytes: 0.02 10*3/uL (ref 0.00–0.07)
Basophils Absolute: 0.1 10*3/uL (ref 0.0–0.1)
Basophils Relative: 1 %
Eosinophils Absolute: 0.5 10*3/uL (ref 0.0–0.5)
Eosinophils Relative: 8 %
HCT: 35 % — ABNORMAL LOW (ref 39.0–52.0)
Hemoglobin: 11.7 g/dL — ABNORMAL LOW (ref 13.0–17.0)
Immature Granulocytes: 0 %
Lymphocytes Relative: 22 %
Lymphs Abs: 1.4 10*3/uL (ref 0.7–4.0)
MCH: 34.4 pg — ABNORMAL HIGH (ref 26.0–34.0)
MCHC: 33.4 g/dL (ref 30.0–36.0)
MCV: 102.9 fL — ABNORMAL HIGH (ref 80.0–100.0)
Monocytes Absolute: 0.6 10*3/uL (ref 0.1–1.0)
Monocytes Relative: 9 %
Neutro Abs: 4 10*3/uL (ref 1.7–7.7)
Neutrophils Relative %: 60 %
Platelets: 248 10*3/uL (ref 150–400)
RBC: 3.4 MIL/uL — ABNORMAL LOW (ref 4.22–5.81)
RDW: 12.2 % (ref 11.5–15.5)
WBC: 6.5 10*3/uL (ref 4.0–10.5)
nRBC: 0 % (ref 0.0–0.2)

## 2023-05-14 LAB — COMPREHENSIVE METABOLIC PANEL
ALT: 29 U/L (ref 0–44)
AST: 28 U/L (ref 15–41)
Albumin: 4 g/dL (ref 3.5–5.0)
Alkaline Phosphatase: 63 U/L (ref 38–126)
Anion gap: 7 (ref 5–15)
BUN: 21 mg/dL (ref 8–23)
CO2: 22 mmol/L (ref 22–32)
Calcium: 9.5 mg/dL (ref 8.9–10.3)
Chloride: 108 mmol/L (ref 98–111)
Creatinine, Ser: 1.26 mg/dL — ABNORMAL HIGH (ref 0.61–1.24)
GFR, Estimated: >60 mL/min (ref >60)
Glucose, Bld: 127 mg/dL — ABNORMAL HIGH (ref 70–99)
Potassium: 3.7 mmol/L (ref 3.5–5.1)
Sodium: 137 mmol/L (ref 135–145)
Total Bilirubin: 0.7 mg/dL (ref 0.0–1.2)
Total Protein: 6.9 g/dL (ref 6.5–8.1)

## 2023-05-14 LAB — IRON AND TIBC
Iron: 71 ug/dL (ref 45–182)
Saturation Ratios: 18 % (ref 17.9–39.5)
TIBC: 385 ug/dL (ref 250–450)
UIBC: 314 ug/dL

## 2023-05-14 LAB — FERRITIN: Ferritin: 313 ng/mL (ref 24–336)

## 2023-05-15 LAB — CEA: CEA: 1 ng/mL (ref 0.0–4.7)

## 2023-05-20 NOTE — Progress Notes (Signed)
[facility] 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  Total examined lymph nodes 35.  MMR-preserved. - CT CAP on 07/31/2021 with no evidence of metastatic disease. - Xeloda in the adjuvant setting from 09/05/2021 through 03/25/2022    Social/family history: - He lives by himself.  He is a retired Personnel officer.  No exposure to asbestos.  Non-smoker. - Father had colon cancer in his 41s.  Maternal uncle had colon cancer.  Plan: T4N0 sigmoid colon cancer: - Denies any change in the bowels in the bag.  No bleeding in the colostomy bag. - Labs today: Normal LFTs.  CEA was normal at 1.0. - Recommend follow-up in 3 months with repeat CEA and CT of the abdomen and pelvis.  2.  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  Last Feraheme on 08/16/2022.  Ferritin is 313 and percent saturation 18.  Hemoglobin 11.7.  Will check at next visit.  3.  CKD: - Creatinine is stable at 1.26.  Elevated creatinine since 11/26/2021.  He missed appointments with Dr. Wolfgang Phoenix.  If there is any worsening, will reconsult.    Orders Placed This Encounter  Procedures   CT ABDOMEN PELVIS W CONTRAST    Standing Status:   Future    Expected Date:   08/19/2023    Expiration Date:   05/20/2024    If indicated for the ordered procedure, I authorize the administration of  contrast media per Radiology protocol:   Yes    Does the patient have a contrast media/X-ray dye allergy?:   No    Preferred imaging location?:   [facility]    If indicated for the ordered procedure, I authorize the administration of oral contrast media per Radiology protocol:   Yes       I,Helena R Teague,acting as a scribe for Doreatha Massed, MD.,have documented all relevant documentation on the behalf of Doreatha Massed, MD,as directed by  Doreatha Massed, MD while in the presence of Doreatha Massed, MD.  I, Doreatha Massed MD, have reviewed the above documentation for accuracy and completeness, and I agree with the above.      Doreatha Massed, MD   1/22/20253:54 PM  CHIEF COMPLAINT:   Diagnosis: rectosigmoid cancer    Cancer Staging  Cancer of rectosigmoid (colon) (HCC) Staging form: Colon and Rectum, AJCC 8th Edition - Clinical stage from 07/31/2021: Stage IIB (cT4a(2), cN0, cM0) - Signed by Dellia Beckwith, MD on 09/01/2021  Cecal cancer [facility]) Staging form: Colon and Rectum, AJCC 8th Edition - Clinical stage from 07/31/2021: Stage IIA (cT3(2), cN0, cM0) - Signed by Dellia Beckwith, MD on 09/01/2021    Prior Therapy: none  Current Therapy:  Xeloda   HISTORY OF PRESENT ILLNESS:   Oncology History  Cecal cancer (HCC)  07/31/2021 Cancer Staging   Staging form: Colon and Rectum, AJCC 8th Edition - Clinical stage from 07/31/2021: Stage IIA (cT3(2), cN0, cM0) -  Signed by Dellia Beckwith, MD on 09/01/2021 Histopathologic type: Adenocarcinoma, NOS Stage prefix: Initial diagnosis Total positive nodes: 0 Total nodes examined: 35 Histologic grade (G): G2 Histologic grading system: 4 grade system Laterality: Right Bilateral cancer: Yes Tumor size (mm): 74 Multiple tumors: Yes Number of tumors: 2 Lymph-vascular invasion (LVI): LVI not present (absent)/not identified Diagnostic confirmation: Positive histology Specimen type:  Excision Staged by: Managing physician Tumor deposits (TD): Absent Perineural invasion (PNI): Absent Microsatellite instability (MSI): Stable KRAS mutation: Not assessed NRAS mutation: Not assessed BRAF mutation: Not assessed Stage used in treatment planning: Yes National guidelines used in treatment planning: Yes Type of national guideline used in treatment planning: NCCN   08/28/2021 Initial Diagnosis   Cecal cancer (HCC)   Cancer of rectosigmoid (colon) (HCC)  07/31/2021 Initial Diagnosis   Cancer of rectosigmoid (colon) (HCC)   07/31/2021 Cancer Staging   Staging form: Colon and Rectum, AJCC 8th Edition - Clinical stage from 07/31/2021: Stage IIB (cT4a(2), cN0, cM0) - Signed by Dellia Beckwith, MD on 09/01/2021 Histopathologic type: Adenocarcinoma, NOS Stage prefix: Initial diagnosis Total positive nodes: 0 Total nodes examined: 35 Histologic grade (G): G2 Histologic grading system: 4 grade system Laterality: Left Bilateral cancer: Yes Tumor size (mm): 83 Multiple tumors: Yes Number of tumors: 2 Lymph-vascular invasion (LVI): LVI not present (absent)/not identified Diagnostic confirmation: Positive histology Specimen type: Excision Staged by: Managing physician Tumor deposits (TD): Absent Carcinoembryonic antigen (CEA) (ng/mL): 1.6 Perineural invasion (PNI): Absent Microsatellite instability (MSI): Stable KRAS mutation: Not assessed NRAS mutation: Not assessed BRAF mutation: Not assessed Stage used in treatment planning: Yes National guidelines used in treatment planning: Yes Type of national guideline used in treatment planning: NCCN   09/12/2021 - 09/12/2021 Chemotherapy   Patient is on Treatment Plan : COLORECTAL Capecitabine q21d        INTERVAL HISTORY:   Olie is a 68 y.o. male presenting to clinic today for follow up of rectosigmoid cancer. He was last seen by me on 02/11/23.  Today, he states that he is doing okay overall. His appetite level is at 100%.  His energy level is at 75%.  PAST MEDICAL HISTORY:   Past Medical History: Past Medical History:  Diagnosis Date   Arthritis    Asthma    Vertigo     Surgical History: Past Surgical History:  Procedure Laterality Date   BIOPSY  07/31/2021   Procedure: BIOPSY;  Surgeon: Lanelle Bal, DO;  Location: [facility];  Service: Endoscopy;;   CATARACT EXTRACTION Left    COLON RESECTION N/A 08/02/2021   Procedure: En bloc Resection of Colon and Small bowel;  Surgeon: Lucretia Roers, MD;  Location: [facility] OR[facility];  Service: General;  Laterality: N/A;   COLOSTOMY Right 08/02/2021   Procedure: ILEOSTOMY;  Surgeon: Lucretia Roers, MD;  Location: [facility] OR[facility];  Service: General;  Laterality: Right;   FLEXIBLE SIGMOIDOSCOPY  07/31/2021   Procedure: FLEXIBLE SIGMOIDOSCOPY;  Surgeon: Lanelle Bal, DO;  Location: [facility];  Service: Endoscopy;;   FLEXIBLE SIGMOIDOSCOPY N/A 12/16/2022   Procedure: FLEXIBLE SIGMOIDOSCOPY;  Surgeon: Lanelle Bal, DO;  Location: [facility];  Service: Endoscopy;  Laterality: N/A;  945AM, ASA 3   SUBMUCOSAL TATTOO INJECTION  07/31/2021   Procedure: SUBMUCOSAL TATTOO INJECTION;  Surgeon: Lanelle Bal, DO;  Location: [facility];  Service: Endoscopy;;   SUBTOTAL COLECTOMY     end ileostomy; sigmoid and cecal cancers    Social History: Social History   Socioeconomic History  Marital status: Single    Spouse name: Not on file   Number of children: 0   Years of education: 12+1   Highest education level: Some college, no degree  Occupational History   Not on file  Tobacco Use   Smoking status: Never   Smokeless tobacco: Never  Vaping Use   Vaping status: Never Used  Substance and Sexual Activity   Alcohol use: Not Currently    Alcohol/week: 4.0 standard drinks of alcohol    Types: 4 Cans of beer per week    Comment: every day-none in 6 months or more   Drug use: Never   Sexual activity: Not Currently  Other Topics Concern   Not on file   Social History Narrative   Not on file   Social Drivers of Health   Financial Resource Strain: Not on file  Food Insecurity: Not on file  Transportation Needs: Not on file  Physical Activity: Not on file  Stress: No Stress Concern Present (08/31/2021)   [employer] of Occupational Health - Occupational Stress Questionnaire    Feeling of Stress : Not at all  Social Connections: Not on file  Intimate Partner Violence: Not At Risk (08/31/2021)   Humiliation, Afraid, Rape, and Kick questionnaire    Fear of Current or Ex-Partner: No    Emotionally Abused: No    Physically Abused: No    Sexually Abused: No    Family History: Family History  Problem Relation Age of Onset   Hypertension Mother    Diabetes Mother    Parkinson's disease Father    Dementia Father     Current Medications:  Current Outpatient Medications:    ASPIRIN 81 PO, Take by mouth., Disp: , Rfl:    b complex vitamins capsule, Take 1 capsule by mouth daily., Disp: , Rfl:    diphenhydrAMINE (BENADRYL) 25 mg capsule, Take 25 mg by mouth as needed., Disp: , Rfl:    Loperamide HCl (IMODIUM PO), Take 1 tablet by mouth 4 (four) times daily., Disp: , Rfl:    meloxicam (MOBIC) 7.5 MG tablet, Take 1 tablet (7.5 mg total) by mouth daily as needed for pain., Disp: 30 tablet, Rfl: 1   psyllium (HYDROCIL/METAMUCIL) 95 % PACK, Take 1 packet by mouth daily., Disp: 240 each, Rfl: 2   Allergies: Allergies  Allergen Reactions   Apple Anaphylaxis    Other reaction(s): Unknown   Apple Juice Anaphylaxis    Throat swelling     REVIEW OF SYSTEMS:   Review of Systems  Constitutional:  Negative for chills, fatigue and fever.  HENT:   Negative for lump/mass, mouth sores, nosebleeds, sore throat and trouble swallowing.   Eyes:  Negative for eye problems.  Respiratory:  Negative for cough and shortness of breath.   Cardiovascular:  Negative for chest pain, leg swelling and palpitations.  Gastrointestinal:  Negative for  abdominal pain, constipation, diarrhea, nausea and vomiting.  Genitourinary:  Negative for bladder incontinence, difficulty urinating, dysuria, frequency, hematuria and nocturia.   Musculoskeletal:  Negative for arthralgias, back pain, flank pain, myalgias and neck pain.  Skin:  Negative for itching and rash.  Neurological:  Positive for dizziness and headaches. Negative for numbness.  Hematological:  Does not bruise/bleed easily.  Psychiatric/Behavioral:  Negative for depression, sleep disturbance and suicidal ideas. The patient is not nervous/anxious.   All other systems reviewed and are negative.    VITALS:   Blood pressure (!) 149/90, pulse (!) 103, temperature 98.1 F (36.7 C), temperature source  Tympanic, resp. rate 19, height 5\' 7"  (1.702 m), weight 178 lb (80.7 kg), SpO2 100%.  Wt Readings from Last 3 Encounters:  05/21/23 178 lb (80.7 kg)  02/11/23 174 lb 6.4 oz (79.1 kg)  12/16/22 171 lb 15.3 oz (78 kg)    Body mass index is 27.88 kg/m.  Performance status (ECOG): 1 - Symptomatic but completely ambulatory  PHYSICAL EXAM:   Physical Exam Vitals and nursing note reviewed. Exam conducted with a chaperone present.  Constitutional:      Appearance: Normal appearance.  Cardiovascular:     Rate and Rhythm: Normal rate and regular rhythm.     Pulses: Normal pulses.     Heart sounds: Normal heart sounds.  Pulmonary:     Effort: Pulmonary effort is normal.     Breath sounds: Normal breath sounds.  Abdominal:     Palpations: Abdomen is soft. There is no hepatomegaly, splenomegaly or mass.     Tenderness: There is no abdominal tenderness.  Musculoskeletal:     Right lower leg: No edema.     Left lower leg: No edema.  Lymphadenopathy:     Cervical: No cervical adenopathy.     Right cervical: No superficial, deep or posterior cervical adenopathy.    Left cervical: No superficial, deep or posterior cervical adenopathy.     Upper Body:     Right upper body: No  supraclavicular or axillary adenopathy.     Left upper body: No supraclavicular or axillary adenopathy.  Neurological:     General: No focal deficit present.     Mental Status: He is alert and oriented to person, place, and time.  Psychiatric:        Mood and Affect: Mood normal.        Behavior: Behavior normal.     LABS:      Latest Ref Rng & Units 05/14/2023    2:34 PM 02/04/2023   12:17 PM 10/28/2022    2:53 PM  CBC  WBC 4.0 - 10.5 K/uL 6.5  8.2  6.2   Hemoglobin 13.0 - 17.0 g/dL 95.1  88.4  16.6   Hematocrit 39.0 - 52.0 % 35.0  38.2  36.0   Platelets 150 - 400 K/uL 248  274  216       Latest Ref Rng & Units 05/14/2023    2:34 PM 02/04/2023   12:17 PM 10/28/2022    2:53 PM  CMP  Glucose 70 - 99 mg/dL 063  96    BUN 8 - 23 mg/dL 21  18    Creatinine 0.16 - 1.24 mg/dL 0.10  9.32    Sodium 355 - 145 mmol/L 137  135    Potassium 3.5 - 5.1 mmol/L 3.7  3.6    Chloride 98 - 111 mmol/L 108  103    CO2 22 - 32 mmol/L 22  23    Calcium 8.9 - 10.3 mg/dL 9.5  9.2    Total Protein 6.5 - 8.1 g/dL 6.9  7.0  7.5   Total Bilirubin 0.0 - 1.2 mg/dL 0.7  0.9  0.6   Alkaline Phos 38 - 126 U/L 63  74  83   AST 15 - 41 U/L 28  45  25   ALT 0 - 44 U/L 29  63  21      Lab Results  Component Value Date   CEA1 1.0 05/14/2023   /  CEA  Date Value Ref Range Status  05/14/2023 1.0 0.0 - 4.7  ng/mL Final    Comment:    (NOTE)                             Nonsmokers          <3.9                             Smokers             <5.6 Roche [employer]ostics Electrochemiluminescence Immunoassay (ECLIA) Values obtained with different assay methods or kits cannot be used interchangeably.  Results cannot be interpreted as absolute evidence of the presence or absence of malignant disease. Performed At: [facility] [address] [place], [place] [id number] Jolene Schimke MD [id number]    No results found for: "PSA1" No results found for: "CAN199" No results found for: "CAN125"  No  results found for: "TOTALPROTELP", "ALBUMINELP", "A1GS", "A2GS", "BETS", "BETA2SER", "GAMS", "MSPIKE", "SPEI" Lab Results  Component Value Date   TIBC 385 05/14/2023   TIBC 371 02/04/2023   TIBC 444 03/27/2022   FERRITIN 313 05/14/2023   FERRITIN 404 (H) 02/04/2023   FERRITIN 93 03/27/2022   IRONPCTSAT 18 05/14/2023   IRONPCTSAT 28 02/04/2023   IRONPCTSAT 26 03/27/2022   No results found for: "LDH"   STUDIES:   No results found.

## 2023-05-21 ENCOUNTER — Inpatient Hospital Stay: Payer: Medicare Other | Admitting: Hematology

## 2023-05-21 VITALS — BP 149/90 | HR 103 | Temp 98.1°F | Resp 19 | Ht 67.0 in | Wt 178.0 lb

## 2023-05-21 DIAGNOSIS — C19 Malignant neoplasm of rectosigmoid junction: Secondary | ICD-10-CM | POA: Diagnosis not present

## 2023-05-21 DIAGNOSIS — D631 Anemia in chronic kidney disease: Secondary | ICD-10-CM | POA: Diagnosis not present

## 2023-05-21 DIAGNOSIS — Z85038 Personal history of other malignant neoplasm of large intestine: Secondary | ICD-10-CM | POA: Diagnosis not present

## 2023-05-21 DIAGNOSIS — N189 Chronic kidney disease, unspecified: Secondary | ICD-10-CM | POA: Diagnosis not present

## 2023-05-21 MED ORDER — MELOXICAM 7.5 MG PO TABS: 7.5000 mg | ORAL_TABLET | Freq: Every day | ORAL | 1 refills | Status: DC | PRN

## 2023-05-21 NOTE — Patient Instructions (Addendum)
[facility] at [facility] Discharge Instructions   You were seen and examined today by Dr. Ellin Saba.  He reviewed the results of your lab work which are normal/stable. Your kidney number has gone up since the last time we checked. Your cancer tumor marker is normal.   We will see you back in 3 months. We will repeat lab work and a CT scan prior to your visit.   Return as scheduled.    Thank you for choosing [facility] at [facility] to provide your oncology and hematology care.  To afford each patient quality time with our provider, please arrive at least 15 minutes before your scheduled appointment time.   If you have a lab appointment with the Cancer Center please come in thru the Main Entrance and check in at the main information desk.  You need to re-schedule your appointment should you arrive 10 or more minutes late.  We strive to give you quality time with our providers, and arriving late affects you and other patients whose appointments are after yours.  Also, if you no show three or more times for appointments you may be dismissed from the clinic at the providers discretion.     Again, thank you for choosing [facility].  Our hope is that these requests will decrease the amount of time that you wait before being seen by our physicians.       _____________________________________________________________  Should you have questions after your visit to [facility], please contact our office at [phone] and follow the prompts.  Our office hours are 8:00 a.m. and 4:30 p.m. Monday - Friday.  Please note that voicemails left after 4:00 p.m. may not be returned until the following business day.  We are closed weekends and major holidays.  You do have access to a nurse 24-7, just call the main number to the clinic [phone] and do not press any options, hold on the line and a nurse will answer the phone.    For  prescription refill requests, have your pharmacy contact our office and allow 72 hours.    Due to Covid, you will need to wear a mask upon entering the hospital. If you do not have a mask, a mask will be given to you at the Main Entrance upon arrival. For doctor visits, patients may have 1 support person age 54 or older with them. For treatment visits, patients can not have anyone with them due to social distancing guidelines and our immunocompromised population.

## 2023-05-27 ENCOUNTER — Telehealth: Payer: Self-pay | Admitting: General Surgery

## 2023-05-27 NOTE — Telephone Encounter (Signed)
Todd Richardson with [facility] left message on voicemail they need a copy of prescription and office notes for ostomy supplies.  Please fax to [phone]

## 2023-05-28 NOTE — Telephone Encounter (Signed)
Call placed to patient to inquire [phone]~ telephone. Patient reports that he has not established with new PCP at this time. Mailed out list of providers accepting new patients for patient to review.   Call placed to telephone number on encounter ([phone]~ telephone) and was routed to [facility]. Connected to [facility]. Advised that [facility] is willing to sign orders for supplies, but we do not have the supply list that patient will require.   Also advised that patient has not been seen in our office since 11/2022. Inquired if note from that time would be sufficient. Advised that notes are acceptable for 12 months prior.   Requested supplies are as follows: AdaptT Medical Adhesive Remover Spray New ImageT Two-Piece Drainable Ostomy Pouch - Lock 'n RollT Microseal Closure- flange size base 30 cm   AdaptT CeraRingT Barrier Rings AdaptT CeraPlusT Barrier Extenders New ImageT Flat CeraPlusT Skin Barrier - Tape  Deatra Ina reports that quantity and day supply are determined by ordering provider.   Please advise.

## 2023-05-29 NOTE — Telephone Encounter (Signed)
Order transcribed and notes faxed to [facility].

## 2023-06-13 DIAGNOSIS — Z299 Encounter for prophylactic measures, unspecified: Secondary | ICD-10-CM | POA: Diagnosis not present

## 2023-06-13 DIAGNOSIS — R197 Diarrhea, unspecified: Secondary | ICD-10-CM | POA: Diagnosis not present

## 2023-06-13 DIAGNOSIS — L259 Unspecified contact dermatitis, unspecified cause: Secondary | ICD-10-CM | POA: Diagnosis not present

## 2023-06-13 DIAGNOSIS — J3089 Other allergic rhinitis: Secondary | ICD-10-CM | POA: Diagnosis not present

## 2023-06-13 DIAGNOSIS — M25561 Pain in right knee: Secondary | ICD-10-CM | POA: Diagnosis not present

## 2023-08-08 ENCOUNTER — Other Ambulatory Visit: Payer: Self-pay

## 2023-08-08 DIAGNOSIS — D500 Iron deficiency anemia secondary to blood loss (chronic): Secondary | ICD-10-CM

## 2023-08-08 DIAGNOSIS — C19 Malignant neoplasm of rectosigmoid junction: Secondary | ICD-10-CM

## 2023-08-11 ENCOUNTER — Ambulatory Visit (HOSPITAL_COMMUNITY)
Admission: RE | Admit: 2023-08-11 | Discharge: 2023-08-11 | Disposition: A | Discharge status: Home / Self Care | Payer: Medicare Other | Source: Ambulatory Visit | Attending: Hematology | Admitting: Hematology

## 2023-08-11 ENCOUNTER — Inpatient Hospital Stay: Payer: Medicare Other | Attending: Hematology

## 2023-08-11 DIAGNOSIS — C19 Malignant neoplasm of rectosigmoid junction: Secondary | ICD-10-CM | POA: Diagnosis not present

## 2023-08-11 DIAGNOSIS — N189 Chronic kidney disease, unspecified: Secondary | ICD-10-CM | POA: Diagnosis not present

## 2023-08-11 DIAGNOSIS — Z85038 Personal history of other malignant neoplasm of large intestine: Secondary | ICD-10-CM | POA: Insufficient documentation

## 2023-08-11 DIAGNOSIS — C189 Malignant neoplasm of colon, unspecified: Secondary | ICD-10-CM | POA: Diagnosis not present

## 2023-08-11 DIAGNOSIS — R7989 Other specified abnormal findings of blood chemistry: Secondary | ICD-10-CM | POA: Insufficient documentation

## 2023-08-11 DIAGNOSIS — D631 Anemia in chronic kidney disease: Secondary | ICD-10-CM | POA: Diagnosis not present

## 2023-08-11 DIAGNOSIS — E611 Iron deficiency: Secondary | ICD-10-CM | POA: Diagnosis not present

## 2023-08-11 DIAGNOSIS — D500 Iron deficiency anemia secondary to blood loss (chronic): Secondary | ICD-10-CM

## 2023-08-11 DIAGNOSIS — N3289 Other specified disorders of bladder: Secondary | ICD-10-CM | POA: Diagnosis not present

## 2023-08-11 LAB — COMPREHENSIVE METABOLIC PANEL WITH GFR
ALT: 37 U/L (ref 0–44)
AST: 33 U/L (ref 15–41)
Albumin: 4.2 g/dL (ref 3.5–5.0)
Alkaline Phosphatase: 63 U/L (ref 38–126)
Anion gap: 9 (ref 5–15)
BUN: 26 mg/dL — ABNORMAL HIGH (ref 8–23)
CO2: 24 mmol/L (ref 22–32)
Calcium: 9.8 mg/dL (ref 8.9–10.3)
Chloride: 103 mmol/L (ref 98–111)
Creatinine, Ser: 1.28 mg/dL — ABNORMAL HIGH (ref 0.61–1.24)
GFR, Estimated: >60 mL/min (ref >60)
Glucose, Bld: 106 mg/dL — ABNORMAL HIGH (ref 70–99)
Potassium: 4.1 mmol/L (ref 3.5–5.1)
Sodium: 136 mmol/L (ref 135–145)
Total Bilirubin: 0.6 mg/dL (ref 0.0–1.2)
Total Protein: 7.1 g/dL (ref 6.5–8.1)

## 2023-08-11 LAB — CBC WITH DIFFERENTIAL/PLATELET
Abs Immature Granulocytes: 0.01 10*3/uL (ref 0.00–0.07)
Basophils Absolute: 0 10*3/uL (ref 0.0–0.1)
Basophils Relative: 1 %
Eosinophils Absolute: 0.4 10*3/uL (ref 0.0–0.5)
Eosinophils Relative: 6 %
HCT: 36.4 % — ABNORMAL LOW (ref 39.0–52.0)
Hemoglobin: 12.1 g/dL — ABNORMAL LOW (ref 13.0–17.0)
Immature Granulocytes: 0 %
Lymphocytes Relative: 19 %
Lymphs Abs: 1.1 10*3/uL (ref 0.7–4.0)
MCH: 35.2 pg — ABNORMAL HIGH (ref 26.0–34.0)
MCHC: 33.2 g/dL (ref 30.0–36.0)
MCV: 105.8 fL — ABNORMAL HIGH (ref 80.0–100.0)
Monocytes Absolute: 0.5 10*3/uL (ref 0.1–1.0)
Monocytes Relative: 8 %
Neutro Abs: 4 10*3/uL (ref 1.7–7.7)
Neutrophils Relative %: 66 %
Platelets: 245 10*3/uL (ref 150–400)
RBC: 3.44 MIL/uL — ABNORMAL LOW (ref 4.22–5.81)
RDW: 12.2 % (ref 11.5–15.5)
WBC: 6 10*3/uL (ref 4.0–10.5)
nRBC: 0 % (ref 0.0–0.2)

## 2023-08-11 LAB — IRON AND TIBC
Iron: 107 ug/dL (ref 45–182)
Saturation Ratios: 27 % (ref 17.9–39.5)
TIBC: 395 ug/dL (ref 250–450)
UIBC: 288 ug/dL

## 2023-08-11 LAB — FERRITIN: Ferritin: 317 ng/mL (ref 24–336)

## 2023-08-11 MED ORDER — IOHEXOL 300 MG/ML  SOLN
100.0000 mL | Freq: Once | INTRAMUSCULAR | Status: AC | PRN
  Administered 2023-08-11: 100 mL via INTRAVENOUS

## 2023-08-11 MED ORDER — IOHEXOL 9 MG/ML PO SOLN
ORAL | Status: AC
  Filled 2023-08-11: qty 1000

## 2023-08-13 ENCOUNTER — Other Ambulatory Visit: Payer: Self-pay | Admitting: Hematology

## 2023-08-13 LAB — CEA: CEA: 1.2 ng/mL (ref 0.0–4.7)

## 2023-08-13 IMAGING — CT CT ABD-PELV W/ CM
2 of 5 series · 11 of 36 positions shown, 13 images · IV contrast (Omnipaque or Isovue)
Comparison: No prior cross-sectional imaging for comparison.

CLINICAL DATA: This is a cancer staging workup. There is a large
sigmoid mass obstructing the large intestine. Evaluate for
metastasis and/or adenopathy.

EXAM:
CT CHEST, ABDOMEN, AND PELVIS WITH CONTRAST
TECHNIQUE: Multidetector CT imaging of the chest, abdomen and pelvis was
performed following the standard protocol during bolus
administration of intravenous contrast.

[Series 4: coronals · coronal · 0.78mm/px · 3 of 172 slices shown]
[im 35/172  lung]
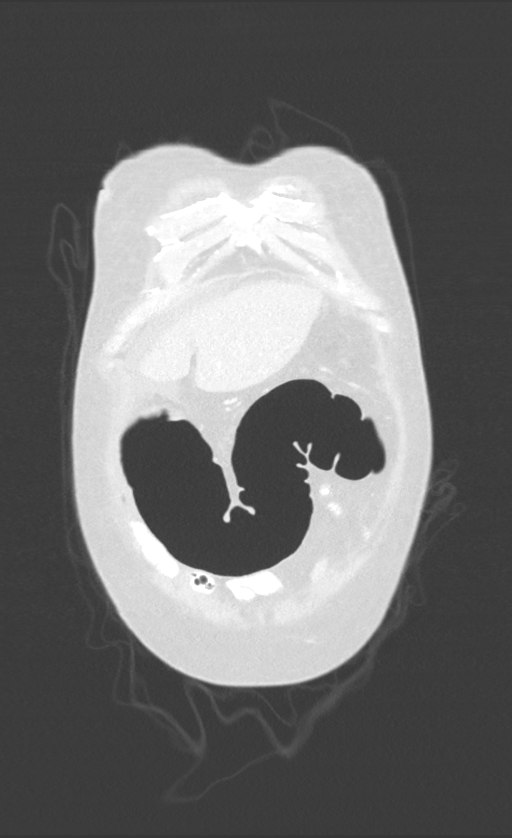
[im 69/172  lung]
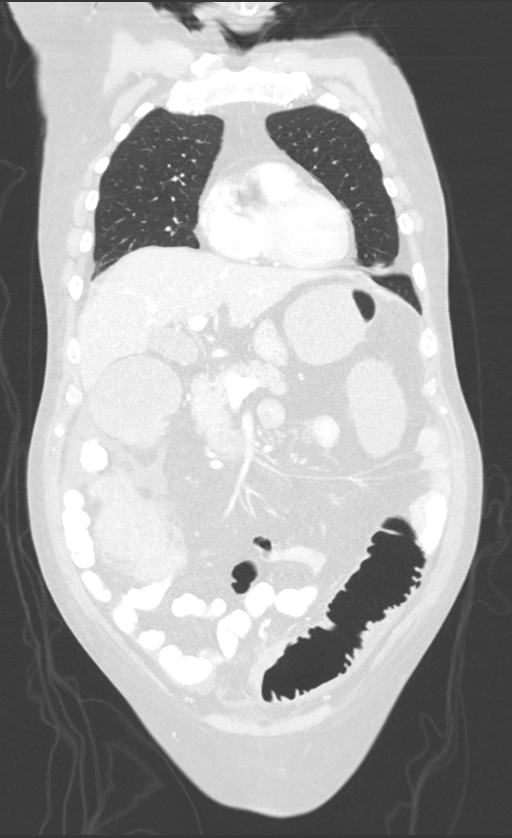
[im 103/172  lung]
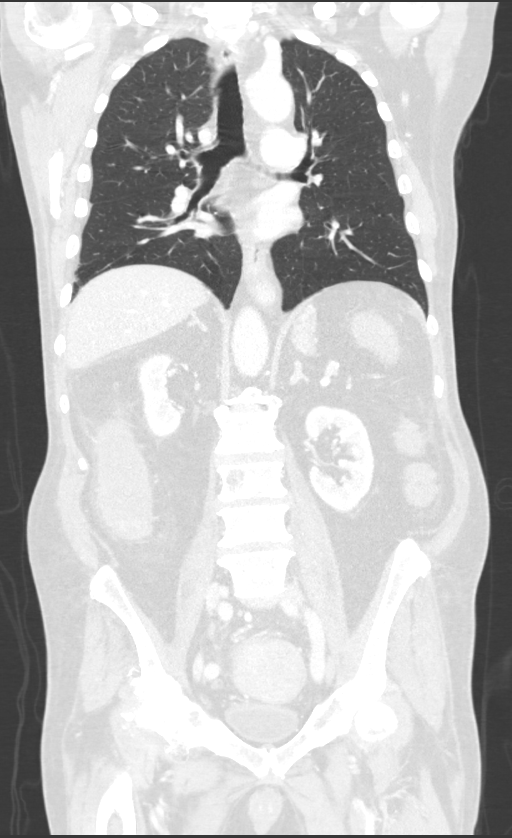

[Series 8: cap with · axial · 0.82mm/px · z∈[+999,+1519]mm · 8 of 131 slices shown, 10 images]
[im 14/131  mediastinal]
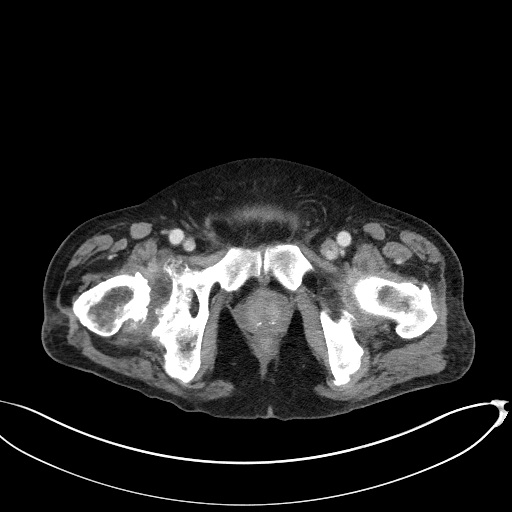
[im 14/131  lung]
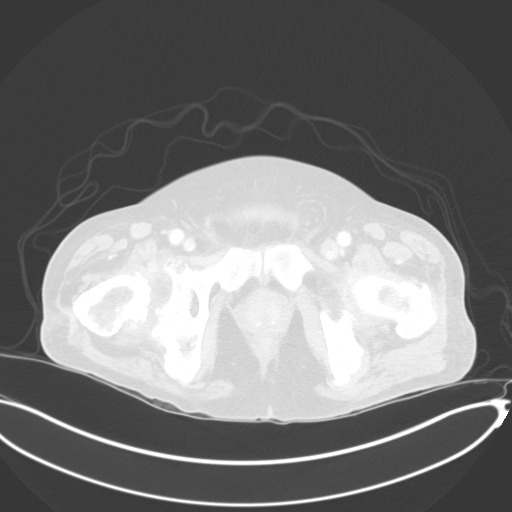
[im 27/131  lung]
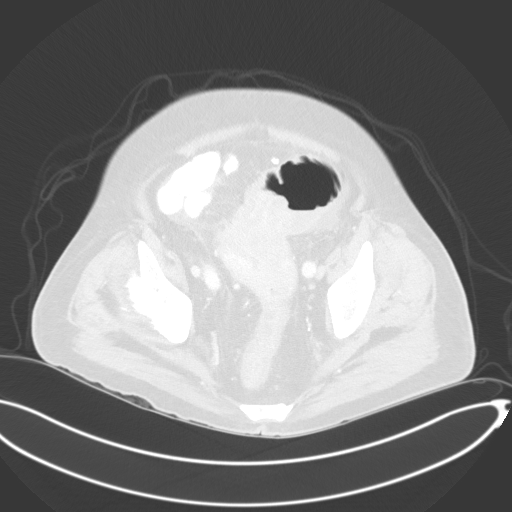
[im 40/131  lung]
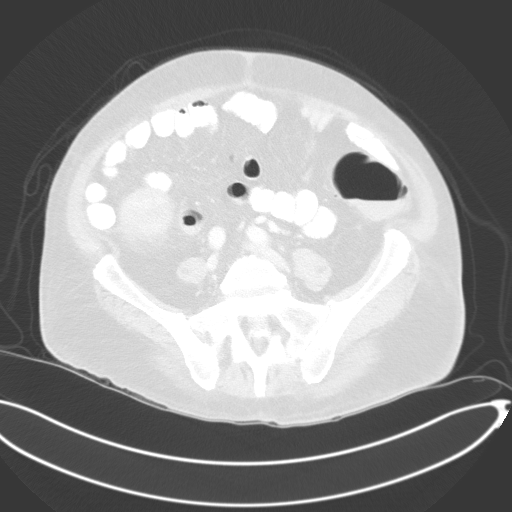
[im 53/131  lung]
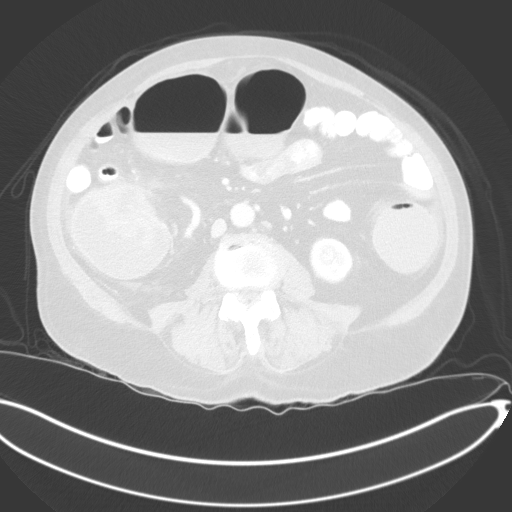
[im 79/131  mediastinal]
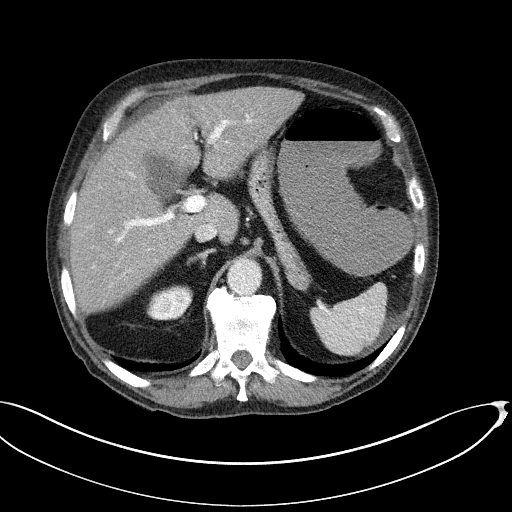
[im 79/131  lung]
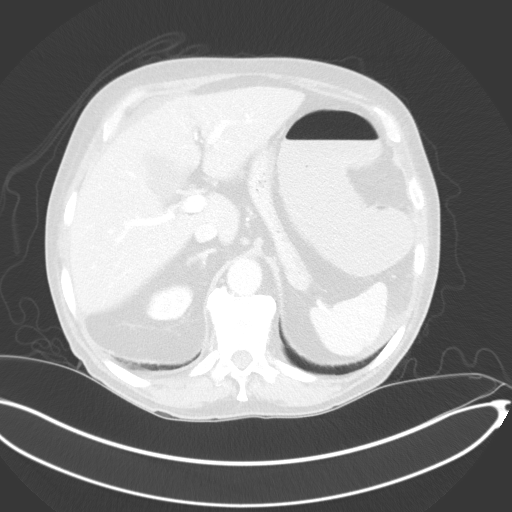
[im 92/131  lung]
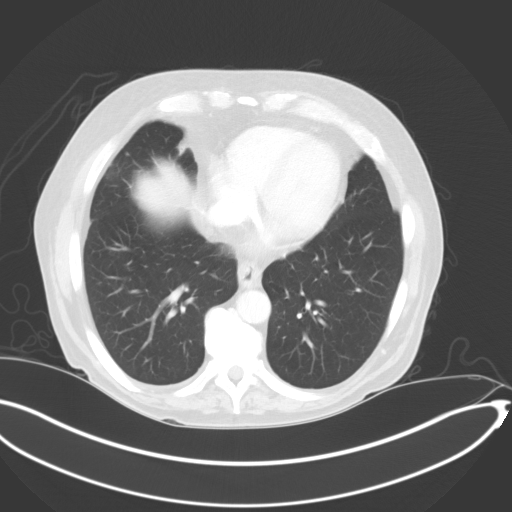
[im 105/131  lung]
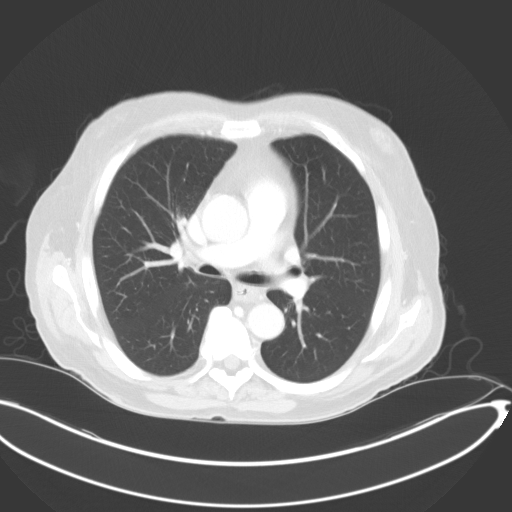
[im 118/131  lung]
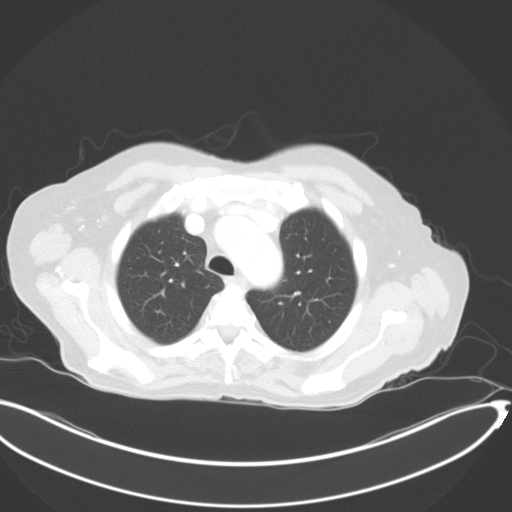

[11 of 36 positions shown; findings below may reference images not displayed]

RADIATION DOSE REDUCTION: This exam was performed according to the
departmental dose-optimization program which includes automated
exposure control, adjustment of the mA and/or kV according to
patient size and/or use of iterative reconstruction technique.

CONTRAST:  100mL OMNIPAQUE IOHEXOL 300 MG/ML  SOLN
There
is a portable chest from today and PA Lat chest from 04/10/2018, and
a portable abdomen film from today
FINDINGS: CT CHEST FINDINGS

Cardiovascular: The cardiac size is normal. There is scattered
three-vessel calcific CAD scattered calcific aortic atherosclerosis
and 3.8 cm ectasia of the ascending segment.

Remaining aorta is normal caliber without dissection or penetrating
ulcer. The pulmonary veins are decompressed. Pulmonary arteries are
normal caliber and centrally clear and there is no pericardial
effusion.

Mediastinum/Nodes: There is a small hiatal hernia and mild
thickening in the mid to distal thoracic esophagus probably due to
reflux or other esophagitis. Visualized thyroid gland is
unremarkable. Axillary spaces are clear. There is subareolar
gynecomastia both breasts. There is no intrathoracic adenopathy. The
trachea is patent.

Lungs/Pleura: No pleural effusion, thickening or pneumothorax. There
are scattered scarring changes in the anterior lung bases. There are
tiny calcified granulomas in the left upper and lower lobes. The
lungs clear of infiltrates and nodules. The central airways are
clear.

Musculoskeletal: There is multilevel bridging enthesopathy of the
thoracic spine consistent with DISH. Mild thoracic kyphosis. No
destructive bone lesion in the thorax is seen.

CT ABDOMEN PELVIS FINDINGS

Hepatobiliary: There is a tiny hypodensity in the dome of the right
lobe of the liver on [DATE] which is too small to characterize. Liver
is mildly steatotic and otherwise unremarkable. The gallbladder and
bile ducts are unremarkable.

Pancreas: No focal abnormality or ductal dilatation.

Spleen: No focal abnormality or splenomegaly.

Adrenals/Urinary Tract: There is no adrenal mass, no focal
abnormality of the renal cortex, and no urinary stone or
obstruction. There is mild bladder thickening versus
underdistention.

Stomach/Bowel: The stomach is contracted. The contrast fills the
small bowel normally. There is no small bowel obstruction or
inflammation. An appendix is not seen this patient.

There is irregular circumferential wall thickening causing moderate
to severe narrowing of the lumen of the cecum and proximal ascending
colon over a 10 cm length of involvement, strongly concerning for
infiltrating neoplasm.

The upstream small bowel is not dilated but there is dilatation in
the more distal ascending colon the transverse and descending colon
up to 8 cm, with fluid levels. There is inflammatory stranding
around the thickened cecum, small volume ascites in the perihepatic
and bilateral pericolic gutter spaces.

There is a large encircling mass of the proximal to mid sigmoid
colon, effacing its lumen measuring up to 10 cm length, 5.8 cm
transversely and 5.7 cm craniocaudal consistent with neoplasm and
with no discernible lumen through it.

More distal sigmoid and rectum are unremarkable. There are stranding
changes around the sigmoid mass.

Vascular/Lymphatic: Mild aortic atherosclerosis without aneurysm.
There are scattered perisigmoid mesenteric lymph nodes, largest
measuring 10 mm in short axis on [DATE]. There are additional
mesenteric nodes medial to the proximal ascending colon measuring up
to 1.1 cm short axis.

Periportal space lymph nodes are noted, with few of these up to
cm in short axis on [DATE] with borderline prominent portacaval space
nodes. There is no bulky, encasing or further adenopathy.

Reproductive: Prostate within normal size limits with central
calcifications, mild impression on the posterior bladder. Seminal
vesicles are unremarkable.

Other: As above there is a small volume of perihepatic ascites,
additional mild perisplenic ascites and bilateral mild paracolic
gutter ascites, with trace ascites in the posterior deep pelvis.
There are small inguinal fat hernias.

Musculoskeletal: There is markedly advanced right hip DJD with
remodeling and bone-on-bone apposition, with subcortical cystic
changes and bulky acetabular osteophytes. There are degenerative
changes of the lumbar spine.

Anterior bridging osteophytes left SI joint. No destructive bone
lesion. Acquired spinal stenosis L4-5. there is a 1.4 cm lucent
lesion to the right within the L3 vertebral body, but there is
trabecular coarsening within it and this is believed to be a
hemangioma.
IMPRESSION: 1. Large proximal to mid sigmoid colon mass, 10.0 x 5.8 x 5.7 cm,
with adjacent inflammatory stranding or lymphangitic carcinomatosis
and least a low-grade upstream colonic obstruction with the
ascending, transverse and descending segments up to 8 cm. There is
no discernible lumen through this.
2. Additional irregular masslike wall thickening, apparently
nonobstructing involves the cecum and proximal ascending colon as
well, with additional adjacent stranding. There is no upstream
distal ileal dilatation.
3. Small lymph nodes are seen medial to the proximal ascending colon
and alongside the sigmoid mass but there is no bulky or encasing
adenopathy. These lymph nodes are probably metastatic. Additional
nonspecific slightly prominent periportal and borderline prominent
portacaval space lymph nodes.
4. There is a tiny too small to characterize hypodensity in the
hepatic dome which could be a cyst or an early metastasis. Liver
mildly steatotic and otherwise unremarkable.
5. No other evidence of extracolonic disease spread is seen
including in the chest.
6. Aortic and coronary artery atherosclerosis.
7. Mild thickening of the thoracic esophagus with small hiatal
hernia. Probable reflux esophagitis. Other esophagitis is possible.
8. Cystitis versus bladder hypertrophy or nondistention. Mild
impression by the prostate gland on the bladder but the prostate is
not grossly enlarged.
9. Markedly advanced right hip DJD with remodeling and
juxta-articular cysts, bulky acetabular osteophytes.
10. 1.4 cm lucent lesion in the L3 vertebral body, but with features
suggesting a probable hemangioma. No suspicious bone lesion.
11. Remaining findings discussed above.

## 2023-08-13 IMAGING — DX DG ABD PORTABLE 1V
2 series · 2 of 2 positions shown · non-contrast
Comparison: Limited correlation made with chest radiographs
04/10/2018.

CLINICAL DATA: Abdominal pain post colonoscopy.

EXAM:
PORTABLE ABDOMEN - 1 VIEW

[abdomen supine (1 of 2)]
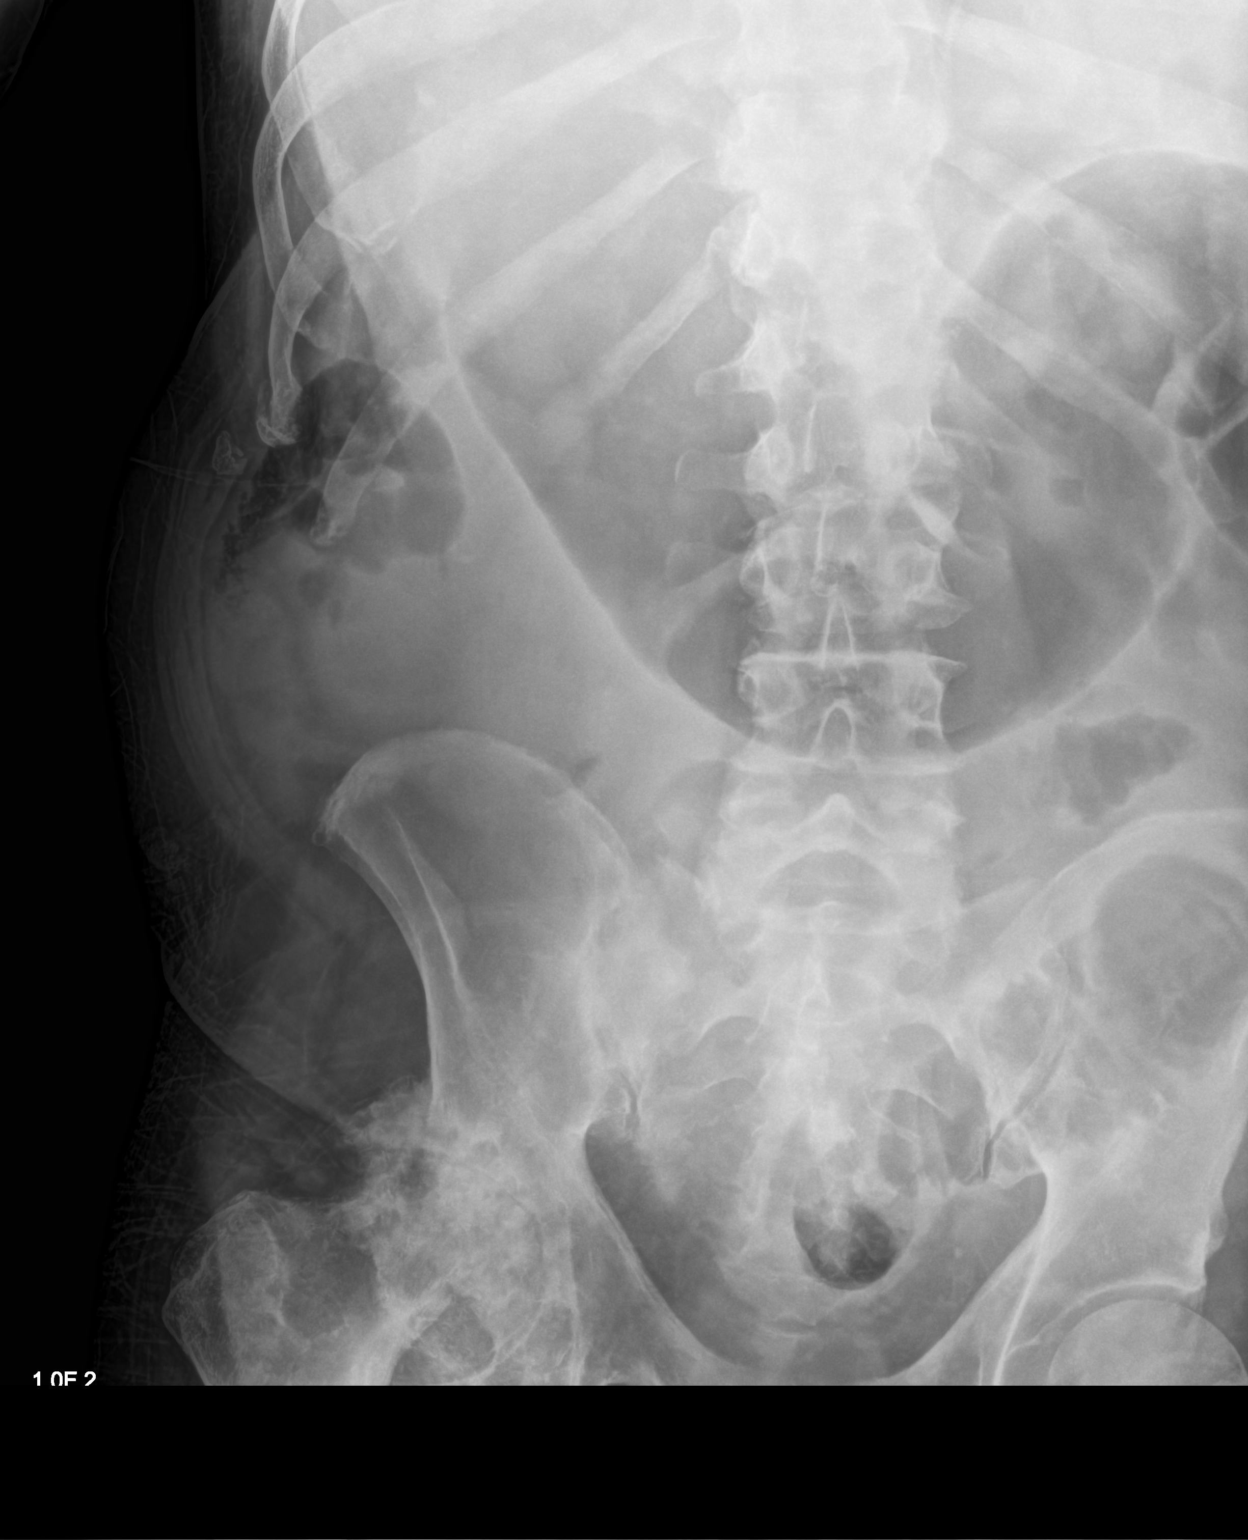

[abdomen supine (2 of 2)]
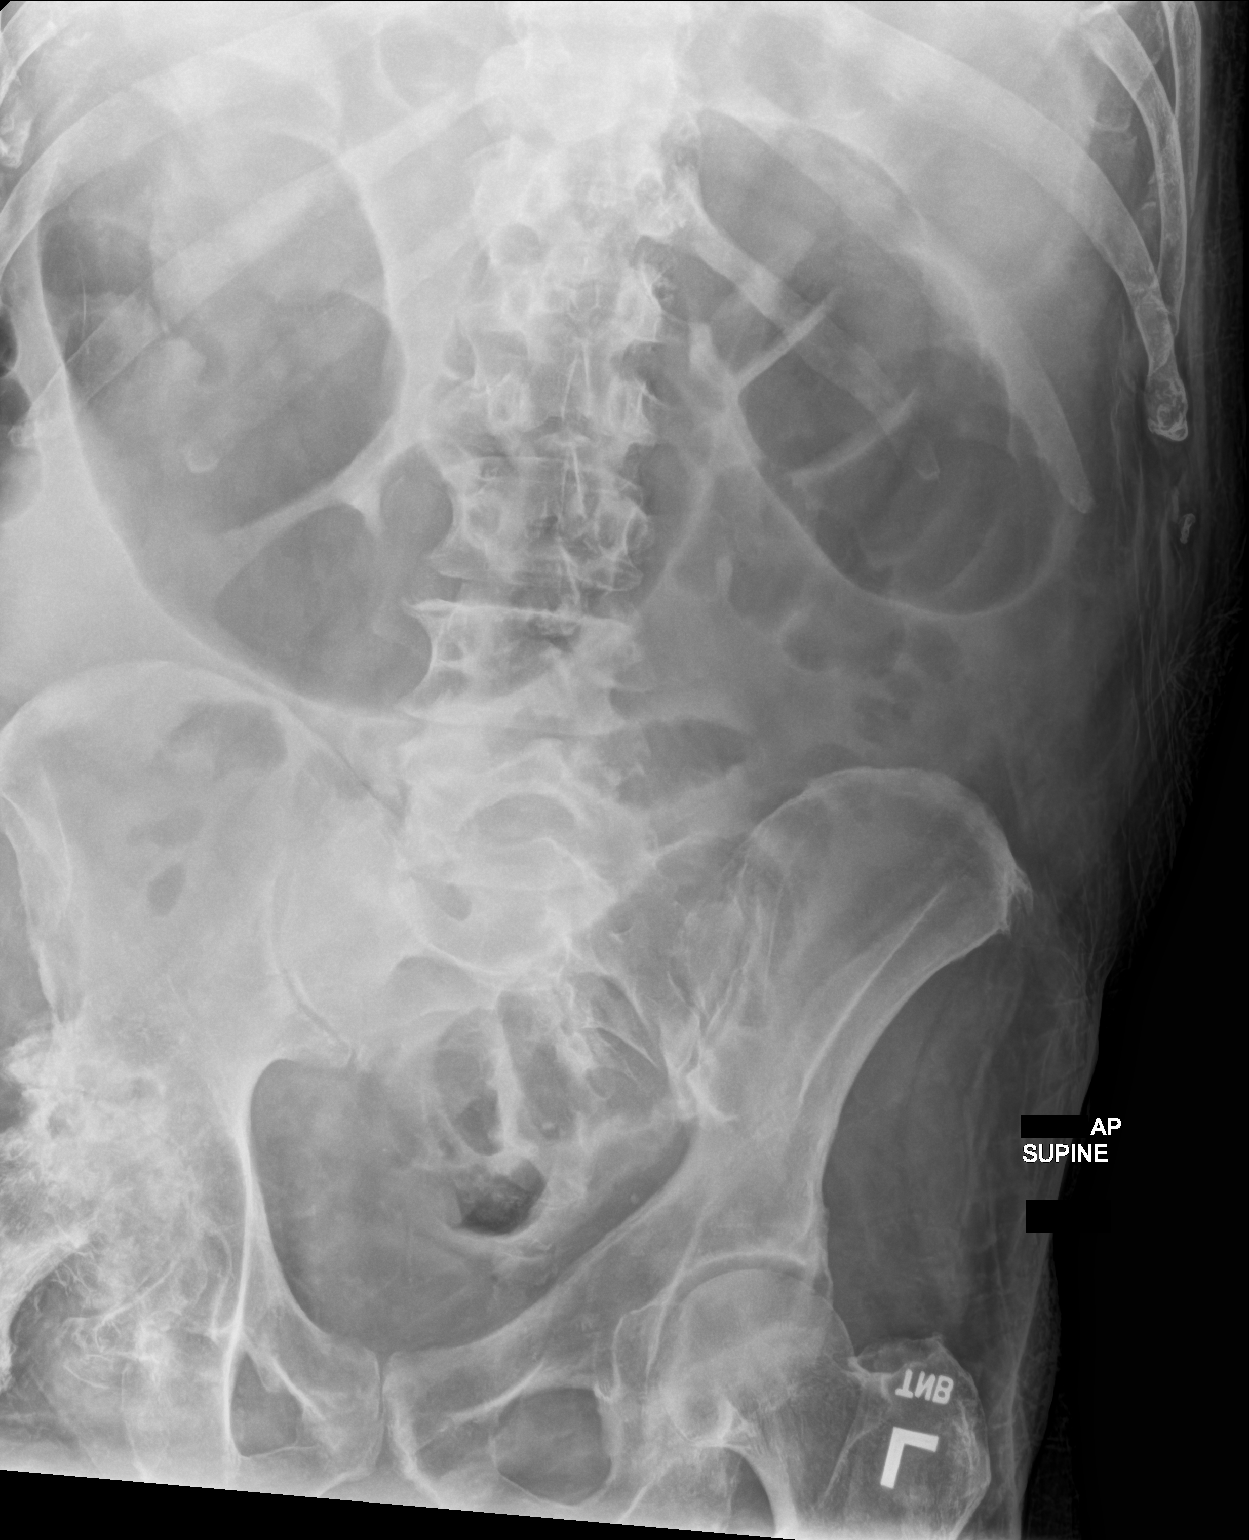

[2 of 2 positions shown; findings below may reference images not displayed]

FINDINGS: Two supine views are submitted. There is moderate dilatation of the
transverse colon. No supine evidence of free intraperitoneal air or
pneumatosis. Severe right hip osteoarthritis noted. No acute osseous
findings are seen.
IMPRESSION: 1. Nonspecific dilatation of the transverse colon following
colonoscopy.
2. No supine evidence of pneumoperitoneum. That would be better
addressed with an erect view of the chest.
3. Severe right hip osteoarthritis.

## 2023-08-15 IMAGING — DX DG ABD PORTABLE 1V
2 series · 2 of 2 positions shown · non-contrast
Comparison: July 31, 2021.

CLINICAL DATA: Nasogastric tube placement.

EXAM:
PORTABLE ABDOMEN - 1 VIEW

[abdomen supine (1 of 2)]
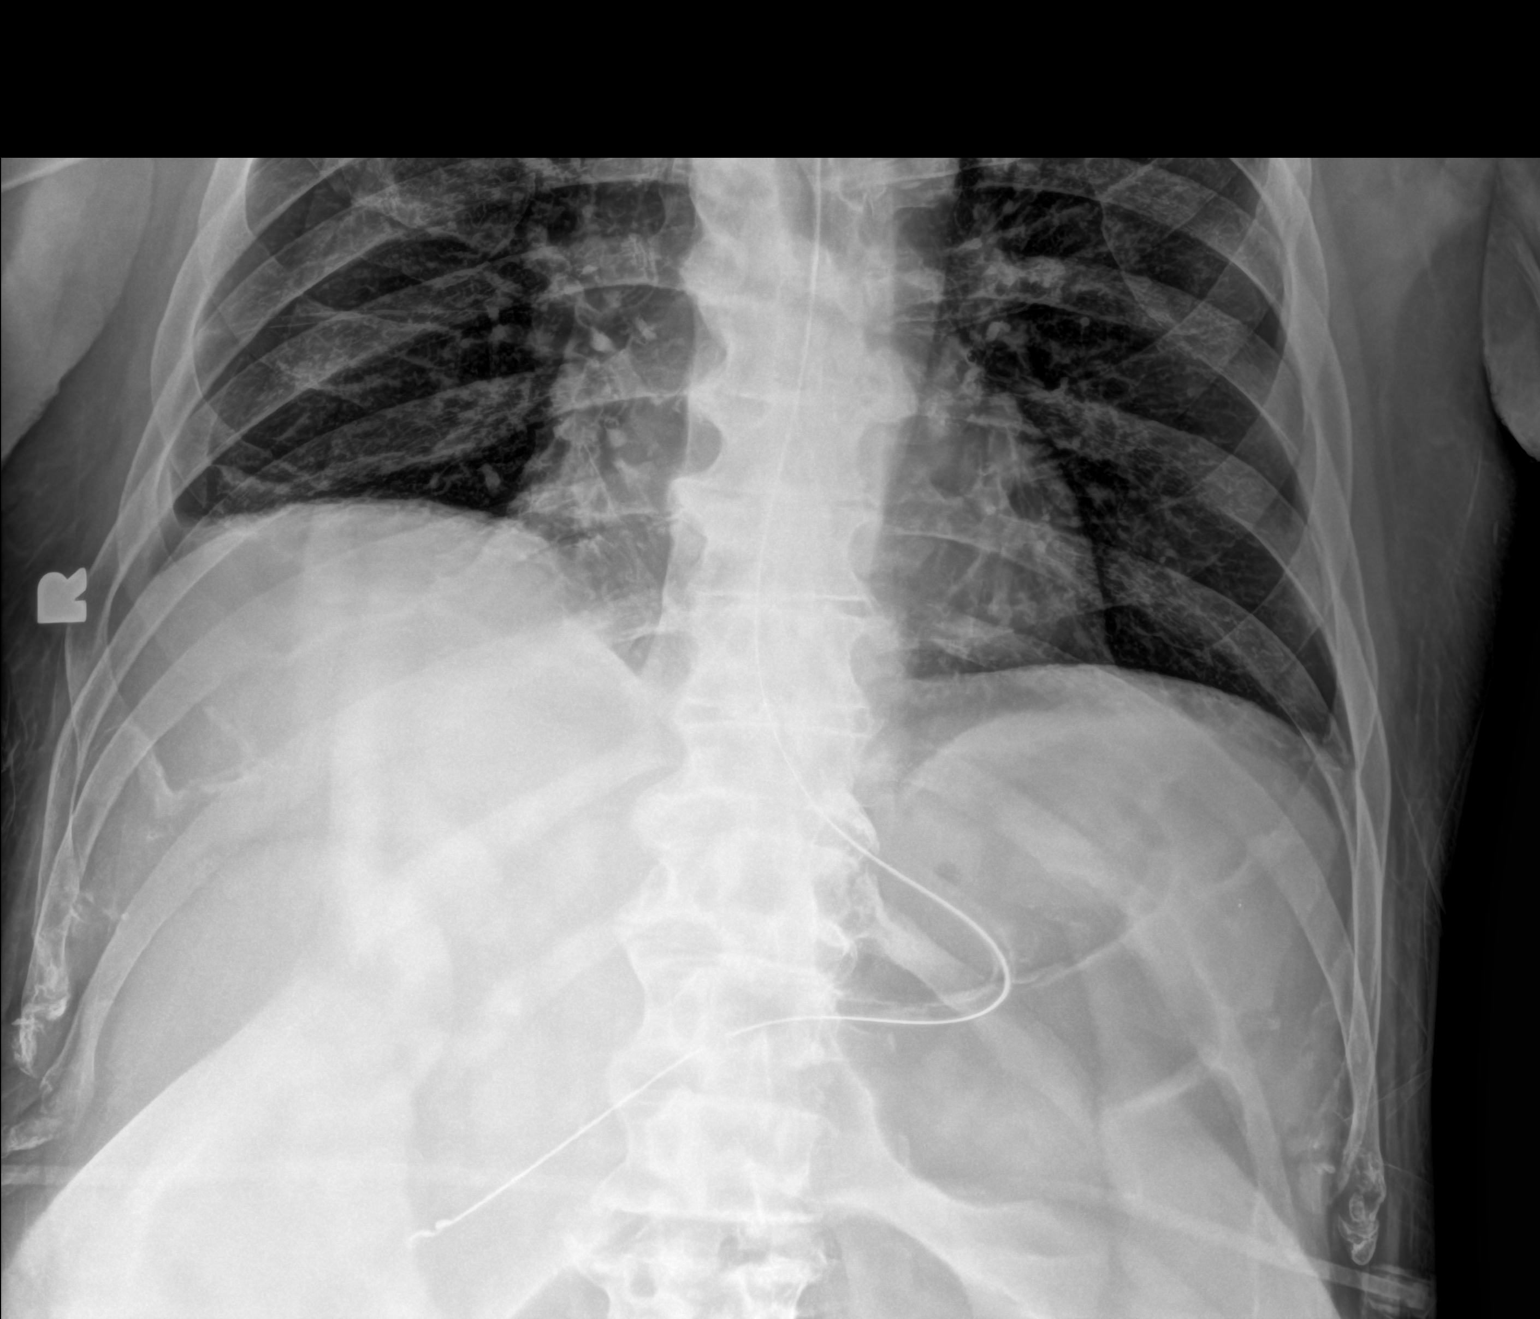

[abdomen supine (2 of 2)]
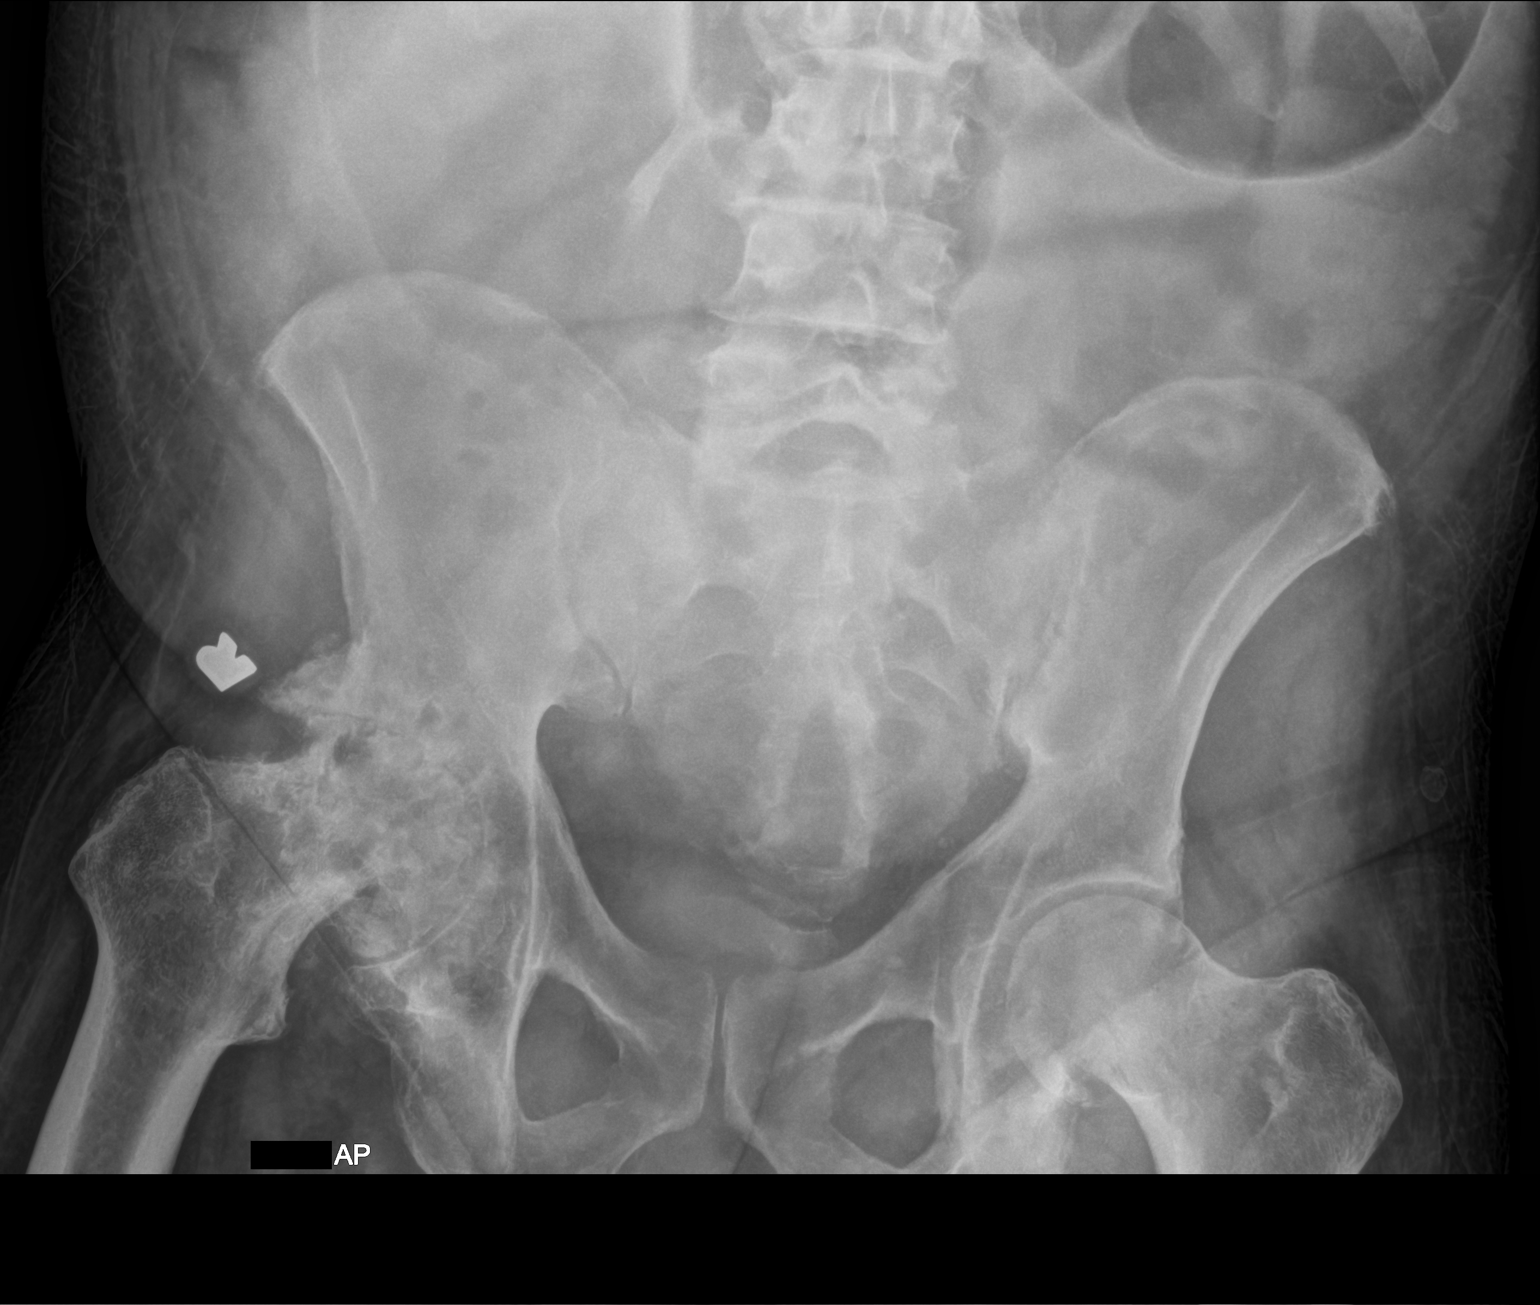

[2 of 2 positions shown; findings below may reference images not displayed]

FINDINGS: Distal tip of nasogastric tube is seen in expected position of
distal stomach. Stable air-filled dilatation of transverse colon is
noted. Severe degenerative change in right hip is noted.
IMPRESSION: Distal tip of nasogastric tube seen in expected position of distal
stomach.

## 2023-08-19 ENCOUNTER — Inpatient Hospital Stay: Payer: Medicare Other | Admitting: Hematology

## 2023-08-19 VITALS — BP 145/89 | HR 78 | Temp 98.5°F | Resp 18 | Wt 177.5 lb

## 2023-08-19 DIAGNOSIS — N189 Chronic kidney disease, unspecified: Secondary | ICD-10-CM | POA: Diagnosis not present

## 2023-08-19 DIAGNOSIS — D631 Anemia in chronic kidney disease: Secondary | ICD-10-CM | POA: Diagnosis not present

## 2023-08-19 DIAGNOSIS — R7989 Other specified abnormal findings of blood chemistry: Secondary | ICD-10-CM | POA: Diagnosis not present

## 2023-08-19 DIAGNOSIS — E611 Iron deficiency: Secondary | ICD-10-CM | POA: Diagnosis not present

## 2023-08-19 DIAGNOSIS — C180 Malignant neoplasm of cecum: Secondary | ICD-10-CM

## 2023-08-19 DIAGNOSIS — Z85038 Personal history of other malignant neoplasm of large intestine: Secondary | ICD-10-CM | POA: Diagnosis not present

## 2023-08-19 NOTE — Patient Instructions (Addendum)
 [facility] at [facility] Discharge Instructions   You were seen and examined today by Dr. Ellin Saba.  He reviewed the results of your lab work which are normal/stable.   He reviewed the results of your CT scan which did not show any evidence of cancer.   We will see you back in 3 months. We will repeat lab work prior to this visit.   Return as scheduled.    Thank you for choosing [facility] at [facility] to provide your oncology and hematology care.  To afford each patient quality time with our provider, please arrive at least 15 minutes before your scheduled appointment time.   If you have a lab appointment with the Cancer Center please come in thru the Main Entrance and check in at the main information desk.  You need to re-schedule your appointment should you arrive 10 or more minutes late.  We strive to give you quality time with our providers, and arriving late affects you and other patients whose appointments are after yours.  Also, if you no show three or more times for appointments you may be dismissed from the clinic at the providers discretion.     Again, thank you for choosing [facility].  Our hope is that these requests will decrease the amount of time that you wait before being seen by our physicians.       _____________________________________________________________  Should you have questions after your visit to [facility], please contact our office at [phone] and follow the prompts.  Our office hours are 8:00 a.m. and 4:30 p.m. Monday - Friday.  Please note that voicemails left after 4:00 p.m. may not be returned until the following business day.  We are closed weekends and major holidays.  You do have access to a nurse 24-7, just call the main number to the clinic [phone] and do not press any options, hold on the line and a nurse will answer the phone.    For prescription refill  requests, have your pharmacy contact our office and allow 72 hours.    Due to Covid, you will need to wear a mask upon entering the hospital. If you do not have a mask, a mask will be given to you at the Main Entrance upon arrival. For doctor visits, patients may have 1 support person age 32 or older with them. For treatment visits, patients can not have anyone with them due to social distancing guidelines and our immunocompromised population.

## 2023-08-19 NOTE — Progress Notes (Signed)
 [facility] 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  Total examined lymph nodes 35.  MMR-preserved. - CT CAP on 07/31/2021 with no evidence of metastatic disease. - Xeloda  in the adjuvant setting from 09/05/2021 through 03/25/2022    Social/family history: - He lives by himself.  He is a retired Personnel officer.  No exposure to asbestos.  Non-smoker. - Father had colon cancer in his 32s.  Maternal uncle had colon cancer.  Plan: T4N0 sigmoid colon cancer: - Denies any change in bowel consistency in the ostomy bag.  Denies any blood in the bag. - Labs from 08/11/2023: Normal LFTs.  CBC grossly normal.  CEA was 1.2. - CTAP (08/11/2023): No evidence of recurrence of malignancy.  Other benign findings were discussed with the patient. - I have recommended follow-up in 3 months with repeat CEA and LFTs.  Will reimage in 6 months.  2.  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  Last Feraheme in April 2024.  Latest ferritin is stable at 317 and hemoglobin 12.1.  No indication for parenteral iron therapy.  3.  CKD: - Creatinine stable at 1.28.  Elevated creatinine since July 2023.  He missed several consultation appointments with Dr. Carrolyn Clan.  If there is any worsening, we will refer him back.    Orders Placed This Encounter  Procedures    CBC with Differential    Standing Status:   Future    Expected Date:   11/17/2023    Expiration Date:   08/18/2024   Comprehensive metabolic panel    Standing Status:   Future    Expected Date:   11/17/2023    Expiration Date:   08/18/2024   CEA    Standing Status:   Future    Expected Date:   11/17/2023    Expiration Date:   08/18/2024       Hurman Maiden R Teague,acting as a scribe for Paulett Boros, MD.,have documented all relevant documentation on the behalf of Paulett Boros, MD,as directed by  Paulett Boros, MD while in the presence of Paulett Boros, MD.  I, Paulett Boros MD, have reviewed the above documentation for accuracy and completeness, and I agree with the above.       Paulett Boros, MD   4/22/20253:20 PM  CHIEF COMPLAINT:   Diagnosis: rectosigmoid cancer    Cancer Staging  Cancer of rectosigmoid (colon) (HCC) Staging form: Colon and Rectum, AJCC 8th Edition - Clinical stage from 07/31/2021: Stage IIB (cT4a(2), cN0, cM0) - Signed by Nolia Baumgartner, MD on 09/01/2021  Cecal cancer [facility]) Staging form: Colon and Rectum, AJCC 8th Edition - Clinical stage from 07/31/2021: Stage IIA (cT3(2), cN0, cM0) - Signed by Nolia Baumgartner, MD on 09/01/2021    Prior Therapy: none  Current Therapy:  Xeloda    HISTORY OF PRESENT ILLNESS:   Oncology History  Cecal cancer (HCC)  07/31/2021 Cancer Staging   Staging form: Colon and  Rectum, AJCC 8th Edition - Clinical stage from 07/31/2021: Stage IIA (cT3(2), cN0, cM0) - Signed by Nolia Baumgartner, MD on 09/01/2021 Histopathologic type: Adenocarcinoma, NOS Stage prefix: Initial diagnosis Total positive nodes: 0 Total nodes examined: 35 Histologic grade (G): G2 Histologic grading system: 4 grade system Laterality: Right Bilateral cancer: Yes Tumor size (mm): 74 Multiple tumors: Yes Number of tumors: 2 Lymph-vascular invasion (LVI): LVI not present (absent)/not identified Diagnostic  confirmation: Positive histology Specimen type: Excision Staged by: Managing physician Tumor deposits (TD): Absent Perineural invasion (PNI): Absent Microsatellite instability (MSI): Stable KRAS mutation: Not assessed NRAS mutation: Not assessed BRAF mutation: Not assessed Stage used in treatment planning: Yes National guidelines used in treatment planning: Yes Type of national guideline used in treatment planning: NCCN   08/28/2021 Initial Diagnosis   Cecal cancer (HCC)   Cancer of rectosigmoid (colon) (HCC)  07/31/2021 Initial Diagnosis   Cancer of rectosigmoid (colon) (HCC)   07/31/2021 Cancer Staging   Staging form: Colon and Rectum, AJCC 8th Edition - Clinical stage from 07/31/2021: Stage IIB (cT4a(2), cN0, cM0) - Signed by Nolia Baumgartner, MD on 09/01/2021 Histopathologic type: Adenocarcinoma, NOS Stage prefix: Initial diagnosis Total positive nodes: 0 Total nodes examined: 35 Histologic grade (G): G2 Histologic grading system: 4 grade system Laterality: Left Bilateral cancer: Yes Tumor size (mm): 83 Multiple tumors: Yes Number of tumors: 2 Lymph-vascular invasion (LVI): LVI not present (absent)/not identified Diagnostic confirmation: Positive histology Specimen type: Excision Staged by: Managing physician Tumor deposits (TD): Absent Carcinoembryonic antigen (CEA) (ng/mL): 1.6 Perineural invasion (PNI): Absent Microsatellite instability (MSI): Stable KRAS mutation: Not assessed NRAS mutation: Not assessed BRAF mutation: Not assessed Stage used in treatment planning: Yes National guidelines used in treatment planning: Yes Type of national guideline used in treatment planning: NCCN   09/12/2021 - 09/12/2021 Chemotherapy   Patient is on Treatment Plan : COLORECTAL Capecitabine  q21d        INTERVAL HISTORY:   Herminio is a 68 y.o. male presenting to clinic today for follow up of rectosigmoid cancer. He was last seen by me on 05/21/23.  Since his last visit, he  underwent CT AP on 08/11/23 that found: No significant interval change. Surgical changes of prior subtotal colectomy with rectosigmoid stump and right lower quadrant ostomy. Large parastomal hernia. No developing new mass lesion, fluid collection or lymph node enlargement. Stable small nodes. Advanced degenerative changes seen of the spine and right hip. Enlarged prostate.  Mild wall thickening of the urinary bladder.  Today, he states that he is doing okay overall. His appetite level is at 75%. His energy level is at 75%. Birl has not been seen by Dr. Carrolyn Clan, as he does not believe he has been called by his office. He notes a normal appetite. He denies any hematochezia and occasionally has to take imodium  for watery stools.   PAST MEDICAL HISTORY:   Past Medical History: Past Medical History:  Diagnosis Date   Arthritis    Asthma    Vertigo     Surgical History: Past Surgical History:  Procedure Laterality Date   BIOPSY  07/31/2021   Procedure: BIOPSY;  Surgeon: Vinetta Greening, DO;  [facility];  Service: Endoscopy;;   CATARACT EXTRACTION Left    COLON RESECTION N/A 08/02/2021   Procedure: En bloc Resection of Colon and Small bowel;  Surgeon: Awilda Bogus, MD;  Location: [facility] OR[facility];  Service: General;  Laterality: N/A;   COLOSTOMY Right 08/02/2021   Procedure: ILEOSTOMY;  Surgeon: Collene Dawson,  Dixon Fredrickson, MD;  Location: [facility];  Service: General;  Laterality: Right;   FLEXIBLE SIGMOIDOSCOPY  07/31/2021   Procedure: FLEXIBLE SIGMOIDOSCOPY;  Surgeon: Vinetta Greening, DO;  Location: [facility];  Service: Endoscopy;;   FLEXIBLE SIGMOIDOSCOPY N/A 12/16/2022   Procedure: FLEXIBLE SIGMOIDOSCOPY;  Surgeon: Vinetta Greening, DO;  Location: [facility];  Service: Endoscopy;  Laterality: N/A;  945AM, ASA 3   SUBMUCOSAL TATTOO INJECTION  07/31/2021   Procedure: SUBMUCOSAL TATTOO INJECTION;  Surgeon: Vinetta Greening, DO;  Location: [facility];  Service: Endoscopy;;   SUBTOTAL  COLECTOMY     end ileostomy; sigmoid and cecal cancers    Social History: Social History   Socioeconomic History   Marital status: Single    Spouse name: Not on file   Number of children: 0   Years of education: 12+1   Highest education level: Some college, no degree  Occupational History   Not on file  Tobacco Use   Smoking status: Never   Smokeless tobacco: Never  Vaping Use   Vaping status: Never Used  Substance and Sexual Activity   Alcohol use: Not Currently    Alcohol/week: 4.0 standard drinks of alcohol    Types: 4 Cans of beer per week    Comment: every day-none in 6 months or more   Drug use: Never   Sexual activity: Not Currently  Other Topics Concern   Not on file  Social History Narrative   Not on file   Social Drivers of Health   Financial Resource Strain: Not on file  Food Insecurity: Not on file  Transportation Needs: Not on file  Physical Activity: Not on file  Stress: No Stress Concern Present (08/31/2021)   [employer] of Occupational Health - Occupational Stress Questionnaire    Feeling of Stress : Not at all  Social Connections: Not on file  Intimate Partner Violence: Not At Risk (08/31/2021)   Humiliation, Afraid, Rape, and Kick questionnaire    Fear of Current or Ex-Partner: No    Emotionally Abused: No    Physically Abused: No    Sexually Abused: No    Family History: Family History  Problem Relation Age of Onset   Hypertension Mother    Diabetes Mother    Parkinson's disease Father    Dementia Father     Current Medications:  Current Outpatient Medications:    diphenhydrAMINE (BENADRYL) 25 mg capsule, Take 25 mg by mouth as needed., Disp: , Rfl:    Loperamide  HCl (IMODIUM  PO), Take 1 tablet by mouth 4 (four) times daily., Disp: , Rfl:    Omega-3 Fatty Acids (FISH OIL OMEGA-3 PO), Take by mouth., Disp: , Rfl:    psyllium (HYDROCIL/METAMUCIL) 95 % PACK, Take 1 packet by mouth daily., Disp: 240 each, Rfl: 2   meloxicam  (MOBIC )  7.5 MG tablet, Take 1 tablet by mouth once daily (Patient not taking: Reported on 08/19/2023), Disp: 30 tablet, Rfl: 0   Allergies: Allergies  Allergen Reactions   Apple Anaphylaxis    Other reaction(s): Unknown   Apple Juice Anaphylaxis    Throat swelling     REVIEW OF SYSTEMS:   Review of Systems  Constitutional:  Negative for chills, fatigue and fever.  HENT:   Negative for lump/mass, mouth sores, nosebleeds, sore throat and trouble swallowing.   Eyes:  Negative for eye problems.  Respiratory:  Negative for cough and shortness of breath.   Cardiovascular:  Negative for chest pain, leg swelling and palpitations.  Gastrointestinal:  Negative for abdominal pain, constipation, diarrhea, nausea and vomiting.  Genitourinary:  Negative for bladder incontinence, difficulty urinating, dysuria, frequency, hematuria and nocturia.   Musculoskeletal:  Negative for arthralgias, back pain, flank pain, myalgias and neck pain.  Skin:  Negative for itching and rash.  Neurological:  Positive for dizziness. Negative for headaches and numbness.  Hematological:  Does not bruise/bleed easily.  Psychiatric/Behavioral:  Negative for depression, sleep disturbance and suicidal ideas. The patient is not nervous/anxious.   All other systems reviewed and are negative.    VITALS:   Blood pressure (!) 145/89, pulse 78, temperature 98.5 F (36.9 C), temperature source Oral, resp. rate 18, weight 177 lb 7.5 oz (80.5 kg), SpO2 96%.  Wt Readings from Last 3 Encounters:  08/19/23 177 lb 7.5 oz (80.5 kg)  05/21/23 178 lb (80.7 kg)  02/11/23 174 lb 6.4 oz (79.1 kg)    Body mass index is 27.8 kg/m.  Performance status (ECOG): 1 - Symptomatic but completely ambulatory  PHYSICAL EXAM:   Physical Exam Vitals and nursing note reviewed. Exam conducted with a chaperone present.  Constitutional:      Appearance: Normal appearance.  Cardiovascular:     Rate and Rhythm: Normal rate and regular rhythm.      Pulses: Normal pulses.     Heart sounds: Normal heart sounds.  Pulmonary:     Effort: Pulmonary effort is normal.     Breath sounds: Normal breath sounds.  Abdominal:     Palpations: Abdomen is soft. There is no hepatomegaly, splenomegaly or mass.     Tenderness: There is no abdominal tenderness.  Musculoskeletal:     Right lower leg: No edema.     Left lower leg: No edema.  Lymphadenopathy:     Cervical: No cervical adenopathy.     Right cervical: No superficial, deep or posterior cervical adenopathy.    Left cervical: No superficial, deep or posterior cervical adenopathy.     Upper Body:     Right upper body: No supraclavicular or axillary adenopathy.     Left upper body: No supraclavicular or axillary adenopathy.  Neurological:     General: No focal deficit present.     Mental Status: He is alert and oriented to person, place, and time.  Psychiatric:        Mood and Affect: Mood normal.        Behavior: Behavior normal.     LABS:      Latest Ref Rng & Units 08/11/2023    2:29 PM 05/14/2023    2:34 PM 02/04/2023   12:17 PM  CBC  WBC 4.0 - 10.5 K/uL 6.0  6.5  8.2   Hemoglobin 13.0 - 17.0 g/dL 28.4  13.2  44.0   Hematocrit 39.0 - 52.0 % 36.4  35.0  38.2   Platelets 150 - 400 K/uL 245  248  274       Latest Ref Rng & Units 08/11/2023    2:29 PM 05/14/2023    2:34 PM 02/04/2023   12:17 PM  CMP  Glucose 70 - 99 mg/dL 102  725  96   BUN 8 - 23 mg/dL 26  21  18    Creatinine 0.61 - 1.24 mg/dL 3.66  4.40  3.47   Sodium 135 - 145 mmol/L 136  137  135   Potassium 3.5 - 5.1 mmol/L 4.1  3.7  3.6   Chloride 98 - 111 mmol/L 103  108  103   CO2 22 -  32 mmol/L 24  22  23    Calcium  8.9 - 10.3 mg/dL 9.8  9.5  9.2   Total Protein 6.5 - 8.1 g/dL 7.1  6.9  7.0   Total Bilirubin 0.0 - 1.2 mg/dL 0.6  0.7  0.9   Alkaline Phos 38 - 126 U/L 63  63  74   AST 15 - 41 U/L 33  28  45   ALT 0 - 44 U/L 37  29  63      Lab Results  Component Value Date   CEA1 1.2 08/11/2023   /  CEA   Date Value Ref Range Status  08/11/2023 1.2 0.0 - 4.7 ng/mL Final    Comment:    (NOTE)                             Nonsmokers          <3.9                             Smokers             <5.6 [employer] Electrochemiluminescence Immunoassay (ECLIA) Values obtained with different assay methods or kits cannot be used interchangeably.  Results cannot be interpreted as absolute evidence of the presence or absence of malignant disease. Performed At: [facility] [address] [place], [place] [id number] Pearlean Botts MD [id number]    No results found for: "PSA1" No results found for: "CAN199" No results found for: "CAN125"  No results found for: "TOTALPROTELP", "ALBUMINELP", "A1GS", "A2GS", "BETS", "BETA2SER", "GAMS", "MSPIKE", "SPEI" Lab Results  Component Value Date   TIBC 395 08/11/2023   TIBC 385 05/14/2023   TIBC 371 02/04/2023   FERRITIN 317 08/11/2023   FERRITIN 313 05/14/2023   FERRITIN 404 (H) 02/04/2023   IRONPCTSAT 27 08/11/2023   IRONPCTSAT 18 05/14/2023   IRONPCTSAT 28 02/04/2023   No results found for: "LDH"   STUDIES:   CT ABDOMEN PELVIS W CONTRAST Result Date: 08/18/2023 CLINICAL DATA:  Stage III colon cancer monitoring. * Tracking Code: BO * EXAM: CT ABDOMEN AND PELVIS WITH CONTRAST TECHNIQUE: Multidetector CT imaging of the abdomen and pelvis was performed using the standard protocol following bolus administration of intravenous contrast. RADIATION DOSE REDUCTION: This exam was performed according to the departmental dose-optimization program which includes automated exposure control, adjustment of the mA and/or kV according to patient size and/or use of iterative reconstruction technique. CONTRAST:  OMNIPAQUE  IOHEXOL  300 MG/ML  SOLN COMPARISON:  02/04/2023 CT scan and older FINDINGS: Lower chest: Lung bases are clear. No pleural effusion. 2 mm nodule along middle lobe is unchanged from previous examination. This has been stable since  at least April 2023 demonstrating 2 year stability. Coronary artery calcifications are seen. Hepatobiliary: No focal liver abnormality is seen. No gallstones, gallbladder wall thickening, or biliary dilatation. Pancreas: Unremarkable. No pancreatic ductal dilatation or surrounding inflammatory changes. Spleen: Normal in size without focal abnormality. Adrenals/Urinary Tract: Adrenal glands are unremarkable. Kidneys are normal, without renal calculi, focal lesion, or hydronephrosis. Bladder is slightly thickened. There is enlarged prostate. Stomach/Bowel: Subtotal colectomy identified. There is a right lower quadrant ostomy with a parastomal hernia. Stable rectosigmoid stump. No abnormal soft tissue in the surgical bed. Stomach is collapsed. Small bowel is nondilated. Vascular/Lymphatic: Normal caliber aorta and IVC. Scattered vascular calcifications. Few small less than 1 cm size nodes are identified including in  the celiac region, unchanged from previous. Not pathologic by size criteria. Small nodes as well in the presacral space. Previous node where measured 4 mm in short axis and today this same node measures 4 mm on series 2, image 58. Reproductive: Enlarged prostate mass effect along the base of the bladder. Other: Small fat containing inguinal hernias, left greater than right. Small umbilical hernia containing fat as well. No free air or free fluid. Musculoskeletal: Moderate degenerative changes along the spine. Advanced changes of the right hip, unchanged from previous. IMPRESSION: No significant interval change. Surgical changes of prior subtotal colectomy with rectosigmoid stump and right lower quadrant ostomy. Large parastomal hernia. No developing new mass lesion, fluid collection or lymph node enlargement. Stable small nodes. Advanced degenerative changes seen of the spine and right hip. Enlarged prostate.  Mild wall thickening of the urinary bladder. Electronically Signed   By: Adrianna Horde M.D.   On:  08/18/2023 10:58

## 2023-08-22 DIAGNOSIS — Z932 Ileostomy status: Secondary | ICD-10-CM | POA: Diagnosis not present

## 2023-08-22 DIAGNOSIS — R197 Diarrhea, unspecified: Secondary | ICD-10-CM | POA: Diagnosis not present

## 2023-08-22 DIAGNOSIS — Z7189 Other specified counseling: Secondary | ICD-10-CM | POA: Diagnosis not present

## 2023-08-22 DIAGNOSIS — Z0000 Encounter for general adult medical examination without abnormal findings: Secondary | ICD-10-CM | POA: Diagnosis not present

## 2023-08-22 DIAGNOSIS — Z789 Other specified health status: Secondary | ICD-10-CM | POA: Diagnosis not present

## 2023-08-22 DIAGNOSIS — Z299 Encounter for prophylactic measures, unspecified: Secondary | ICD-10-CM | POA: Diagnosis not present

## 2023-08-22 DIAGNOSIS — Z1389 Encounter for screening for other disorder: Secondary | ICD-10-CM | POA: Diagnosis not present

## 2023-09-08 ENCOUNTER — Other Ambulatory Visit: Payer: Self-pay | Admitting: Hematology

## 2023-10-06 ENCOUNTER — Other Ambulatory Visit: Payer: Self-pay | Admitting: Hematology

## 2023-11-02 ENCOUNTER — Other Ambulatory Visit: Payer: Self-pay | Admitting: Hematology

## 2023-11-03 ENCOUNTER — Encounter: Payer: Self-pay | Admitting: Hematology

## 2023-11-18 ENCOUNTER — Inpatient Hospital Stay: Attending: Hematology

## 2023-11-18 DIAGNOSIS — Z85038 Personal history of other malignant neoplasm of large intestine: Secondary | ICD-10-CM | POA: Insufficient documentation

## 2023-11-18 DIAGNOSIS — C180 Malignant neoplasm of cecum: Secondary | ICD-10-CM

## 2023-11-18 LAB — CBC WITH DIFFERENTIAL/PLATELET
Abs Immature Granulocytes: 0.03 K/uL (ref 0.00–0.07)
Basophils Absolute: 0 K/uL (ref 0.0–0.1)
Basophils Relative: 1 %
Eosinophils Absolute: 0.3 K/uL (ref 0.0–0.5)
Eosinophils Relative: 5 %
HCT: 34.1 % — ABNORMAL LOW (ref 39.0–52.0)
Hemoglobin: 11.8 g/dL — ABNORMAL LOW (ref 13.0–17.0)
Immature Granulocytes: 1 %
Lymphocytes Relative: 24 %
Lymphs Abs: 1.6 K/uL (ref 0.7–4.0)
MCH: 36.1 pg — ABNORMAL HIGH (ref 26.0–34.0)
MCHC: 34.6 g/dL (ref 30.0–36.0)
MCV: 104.3 fL — ABNORMAL HIGH (ref 80.0–100.0)
Monocytes Absolute: 0.5 K/uL (ref 0.1–1.0)
Monocytes Relative: 7 %
Neutro Abs: 4.1 K/uL (ref 1.7–7.7)
Neutrophils Relative %: 62 %
Platelets: 223 K/uL (ref 150–400)
RBC: 3.27 MIL/uL — ABNORMAL LOW (ref 4.22–5.81)
RDW: 12.8 % (ref 11.5–15.5)
WBC: 6.6 K/uL (ref 4.0–10.5)
nRBC: 0 % (ref 0.0–0.2)

## 2023-11-18 LAB — COMPREHENSIVE METABOLIC PANEL WITH GFR
ALT: 29 U/L (ref 0–44)
AST: 32 U/L (ref 15–41)
Albumin: 3.8 g/dL (ref 3.5–5.0)
Alkaline Phosphatase: 58 U/L (ref 38–126)
Anion gap: 10 (ref 5–15)
BUN: 21 mg/dL (ref 8–23)
CO2: 21 mmol/L — ABNORMAL LOW (ref 22–32)
Calcium: 9.5 mg/dL (ref 8.9–10.3)
Chloride: 106 mmol/L (ref 98–111)
Creatinine, Ser: 1.3 mg/dL — ABNORMAL HIGH (ref 0.61–1.24)
GFR, Estimated: >60 mL/min (ref >60)
Glucose, Bld: 138 mg/dL — ABNORMAL HIGH (ref 70–99)
Potassium: 3.7 mmol/L (ref 3.5–5.1)
Sodium: 137 mmol/L (ref 135–145)
Total Bilirubin: 0.8 mg/dL (ref 0.0–1.2)
Total Protein: 6.6 g/dL (ref 6.5–8.1)

## 2023-11-19 LAB — CEA: CEA: 1.5 ng/mL (ref 0.0–4.7)

## 2023-11-25 ENCOUNTER — Inpatient Hospital Stay: Admitting: Hematology

## 2023-11-25 VITALS — BP 119/84 | HR 89 | Temp 98.2°F | Resp 16 | Wt 177.5 lb

## 2023-11-25 DIAGNOSIS — C19 Malignant neoplasm of rectosigmoid junction: Secondary | ICD-10-CM | POA: Diagnosis not present

## 2023-11-25 DIAGNOSIS — Z85038 Personal history of other malignant neoplasm of large intestine: Secondary | ICD-10-CM | POA: Diagnosis not present

## 2023-11-25 DIAGNOSIS — D500 Iron deficiency anemia secondary to blood loss (chronic): Secondary | ICD-10-CM

## 2023-11-25 NOTE — Progress Notes (Signed)
 [facility] 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  Total examined lymph nodes 35.  MMR-preserved. - CT CAP on 07/31/2021 with no evidence of metastatic disease. - Xeloda  in the adjuvant setting from 09/05/2021 through 03/25/2022    Social/family history: - He lives by himself.  He is a retired Personnel officer.  No exposure to asbestos.  Non-smoker. - Father had colon cancer in his 63s.  Maternal uncle had colon cancer.  Plan: T4N0 sigmoid colon cancer: - He denies any bowel consistency changes.  He has noticed blood in the bag once, most likely from irritation around the ostomy site. - I reviewed labs from 11/18/2023: Normal LFTs.  CBC grossly normal.  CEA was normal at 1.5. - Recommend follow-up in 4 months with repeat CEA and CTAP with contrast.  If everything is stable at next visit, may switch him to once every 77-month visits with CEA and imaging once a year.  2.  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  Last Allegra was in April 2024.  Last ferritin was 317 and saturation 27.  Hemoglobin is stable between 11 and 13.  3.  CKD: - CKD stable at 1.3.  He had missed previous appointments with Dr. Rachele.  If there is any worsening, make a referral to Dr. Rachele.    Orders Placed This Encounter  Procedures   CT ABDOMEN  PELVIS W CONTRAST    Standing Status:   Future    Expected Date:   02/25/2024    Expiration Date:   11/24/2024    If indicated for the ordered procedure, I authorize the administration of contrast media per Radiology protocol:   Yes    Does the patient have a contrast media/X-ray dye allergy?:   No    Preferred imaging location?:   [facility]    If indicated for the ordered procedure, I authorize the administration of oral contrast media per Radiology protocol:   Yes   CBC with Differential    Standing Status:   Future    Expected Date:   03/29/2024    Expiration Date:   06/27/2024   Comprehensive metabolic panel    Standing Status:   Future    Expected Date:   03/29/2024    Expiration Date:   06/27/2024   CEA    Standing Status:   Future    Expected Date:   03/29/2024    Expiration Date:   06/27/2024   Iron and TIBC ([facility] ONLY)    Standing Status:   Future    Expected Date:   03/29/2024    Expiration Date:   06/27/2024   Ferritin    Standing Status:   Future    Expected Date:   03/29/2024    Expiration Date:   06/27/2024       LILLETTE Hummingbird R Teague,acting as a scribe for Hai Rogers, MD.,have documented all relevant documentation on  the behalf of Alean Stands, MD,as directed by  Alean Stands, MD while in the presence of Alean Stands, MD.  I, Alean Stands MD, have reviewed the above documentation for accuracy and completeness, and I agree with the above.       Alean Stands, MD   7/29/20253:52 PM  CHIEF COMPLAINT:   Diagnosis: rectosigmoid cancer    Cancer Staging  Cancer of rectosigmoid (colon) (HCC) Staging form: Colon and Rectum, AJCC 8th Edition - Clinical stage from 07/31/2021: Stage IIB (cT4a(2), cN0, cM0) - Signed by Cornelius Wanda DEL, MD on 09/01/2021  Cecal cancer [facility]) Staging form: Colon and Rectum, AJCC 8th Edition - Clinical stage from 07/31/2021: Stage IIA (cT3(2), cN0, cM0) - Signed by Cornelius Wanda DEL, MD on 09/01/2021    Prior Therapy: none  Current Therapy:  Xeloda    HISTORY OF PRESENT ILLNESS:   Oncology History  Cecal cancer (HCC)  07/31/2021 Cancer Staging   Staging form: Colon and Rectum, AJCC 8th Edition - Clinical stage from 07/31/2021: Stage IIA (cT3(2), cN0, cM0) - Signed by Cornelius Wanda DEL, MD on 09/01/2021 Histopathologic type: Adenocarcinoma, NOS Stage prefix: Initial diagnosis Total positive nodes: 0 Total nodes examined: 35 Histologic grade (G): G2 Histologic grading system: 4 grade system Laterality: Right Bilateral cancer: Yes Tumor size (mm): 74 Multiple tumors: Yes Number of tumors: 2 Lymph-vascular invasion (LVI): LVI not present (absent)/not identified Diagnostic confirmation: Positive histology Specimen type: Excision Staged by: Managing physician Tumor deposits (TD): Absent Perineural invasion (PNI): Absent Microsatellite instability (MSI): Stable KRAS mutation: Not assessed NRAS mutation: Not assessed BRAF mutation: Not assessed Stage used in treatment planning: Yes National guidelines used in treatment planning: Yes Type of national guideline used in treatment planning: NCCN   08/28/2021 Initial Diagnosis   Cecal cancer (HCC)   Cancer of rectosigmoid (colon) (HCC)  07/31/2021 Initial Diagnosis   Cancer of rectosigmoid (colon) (HCC)   07/31/2021 Cancer Staging   Staging form: Colon and Rectum, AJCC 8th Edition - Clinical stage from 07/31/2021: Stage IIB (cT4a(2), cN0, cM0) - Signed by Cornelius Wanda DEL, MD on 09/01/2021 Histopathologic type: Adenocarcinoma, NOS Stage prefix: Initial diagnosis Total positive nodes: 0 Total nodes examined: 35 Histologic grade (G): G2 Histologic grading system: 4 grade system Laterality: Left Bilateral cancer: Yes Tumor size (mm): 83 Multiple tumors: Yes Number of tumors: 2 Lymph-vascular invasion (LVI): LVI not present (absent)/not identified Diagnostic confirmation: Positive histology Specimen  type: Excision Staged by: Managing physician Tumor deposits (TD): Absent Carcinoembryonic antigen (CEA) (ng/mL): 1.6 Perineural invasion (PNI): Absent Microsatellite instability (MSI): Stable KRAS mutation: Not assessed NRAS mutation: Not assessed BRAF mutation: Not assessed Stage used in treatment planning: Yes National guidelines used in treatment planning: Yes Type of national guideline used in treatment planning: NCCN   09/12/2021 - 09/12/2021 Chemotherapy   Patient is on Treatment Plan : COLORECTAL Capecitabine  q21d        INTERVAL HISTORY:   Kelvyn is a 68 y.o. male presenting to clinic today for follow up of rectosigmoid cancer. He was last seen by me on 08/19/2023.  Today, he states that he is doing well overall. His appetite level is at 75%. His energy level is at 50%. Taylor reports one episode of bright red string of blood on the ostomy bag on 11/23/2023 that may have been due to reapplying the ostomy bag too tightly. He notes irritation along the ostomy site. He does not have a nurse he can follow-up with pertaining to ostomy issues.   He states he is  drinking an adequate amount of fluids.  PAST MEDICAL HISTORY:   Past Medical History: Past Medical History:  Diagnosis Date   Arthritis    Asthma    Vertigo     Surgical History: Past Surgical History:  Procedure Laterality Date   BIOPSY  07/31/2021   Procedure: BIOPSY;  Surgeon: Cindie Carlin POUR, DO;  [facility]: [facility];  Service: Endoscopy;;   CATARACT EXTRACTION Left    COLON RESECTION N/A 08/02/2021   Procedure: En bloc Resection of Colon and Small bowel;  Surgeon: Kallie Manuelita BROCKS, MD;  Location: [facility];  Service: General;  Laterality: N/A;   COLOSTOMY Right 08/02/2021   Procedure: ILEOSTOMY;  Surgeon: Kallie Manuelita BROCKS, MD;  Location: [facility];  Service: General;  Laterality: Right;   FLEXIBLE SIGMOIDOSCOPY  07/31/2021   Procedure: FLEXIBLE SIGMOIDOSCOPY;  Surgeon: Cindie Carlin POUR, DO;  [facility];  Service: Endoscopy;;   FLEXIBLE SIGMOIDOSCOPY N/A 12/16/2022   Procedure: FLEXIBLE SIGMOIDOSCOPY;  Surgeon: Cindie Carlin POUR, DO;  Location: [facility];  Service: Endoscopy;  Laterality: N/A;  945AM, ASA 3   SUBMUCOSAL TATTOO INJECTION  07/31/2021   Procedure: SUBMUCOSAL TATTOO INJECTION;  Surgeon: Cindie Carlin POUR, DO;  Location: [facility];  Service: Endoscopy;;   SUBTOTAL COLECTOMY     end ileostomy; sigmoid and cecal cancers    Social History: Social History   Socioeconomic History   Marital status: Single    Spouse name: Not on file   Number of children: 0   Years of education: 12+1   Highest education level: Some college, no degree  Occupational History   Not on file  Tobacco Use   Smoking status: Never   Smokeless tobacco: Never  Vaping Use   Vaping status: Never Used  Substance and Sexual Activity   Alcohol use: Not Currently    Alcohol/week: 4.0 standard drinks of alcohol    Types: 4 Cans of beer per week    Comment: every day-none in 6 months or more   Drug use: Never   Sexual activity: Not Currently  Other Topics Concern   Not on file  Social History Narrative   Not on file   Social Drivers of Health   Financial Resource Strain: Not on file  Food Insecurity: Not on file  Transportation Needs: Not on file  Physical Activity: Not on file  Stress: No Stress Concern Present (08/31/2021)   [employer] of Occupational Health - Occupational Stress Questionnaire    Feeling of Stress : Not at all  Social Connections: Not on file  Intimate Partner Violence: Not At Risk (08/31/2021)   Humiliation, Afraid, Rape, and Kick questionnaire    Fear of Current or Ex-Partner: No    Emotionally Abused: No    Physically Abused: No    Sexually Abused: No    Family History: Family History  Problem Relation Age of Onset   Hypertension Mother    Diabetes Mother    Parkinson's disease Father    Dementia Father     Current Medications:  Current  Outpatient Medications:    diphenhydrAMINE (BENADRYL) 25 mg capsule, Take 25 mg by mouth as needed., Disp: , Rfl:    Loperamide  HCl (IMODIUM  PO), Take 1 tablet by mouth 4 (four) times daily. (Patient taking differently: Take 1 tablet by mouth 4 (four) times daily. 4-8 times daily), Disp: , Rfl:    meloxicam  (MOBIC ) 7.5 MG tablet, Take 1 tablet by mouth once daily, Disp: 30 tablet, Rfl:  0   Omega-3 Fatty Acids (FISH OIL OMEGA-3 PO), Take by mouth., Disp: , Rfl:    psyllium (HYDROCIL/METAMUCIL) 95 % PACK, Take 1 packet by mouth daily., Disp: 240 each, Rfl: 2   Allergies: Allergies  Allergen Reactions   Apple Anaphylaxis    Other reaction(s): Unknown   Apple Juice Anaphylaxis    Throat swelling     REVIEW OF SYSTEMS:   Review of Systems  Constitutional:  Negative for chills, fatigue and fever.  HENT:   Negative for lump/mass, mouth sores, nosebleeds, sore throat and trouble swallowing.   Eyes:  Negative for eye problems.  Respiratory:  Negative for cough and shortness of breath.   Cardiovascular:  Negative for chest pain, leg swelling and palpitations.  Gastrointestinal:  Negative for abdominal pain, constipation, diarrhea, nausea and vomiting.  Genitourinary:  Negative for bladder incontinence, difficulty urinating, dysuria, frequency, hematuria and nocturia.   Musculoskeletal:  Negative for arthralgias, back pain, flank pain, myalgias and neck pain.       +knee pain, 2/10 severity  Skin:  Negative for itching and rash.  Neurological:  Negative for dizziness, headaches and numbness.  Hematological:  Does not bruise/bleed easily.  Psychiatric/Behavioral:  Negative for depression, sleep disturbance and suicidal ideas. The patient is not nervous/anxious.   All other systems reviewed and are negative.    VITALS:   Blood pressure 119/84, pulse 89, temperature 98.2 F (36.8 C), temperature source Oral, resp. rate 16, weight 177 lb 7.5 oz (80.5 kg), SpO2 98%.  Wt Readings from Last 3  Encounters:  11/25/23 177 lb 7.5 oz (80.5 kg)  08/19/23 177 lb 7.5 oz (80.5 kg)  05/21/23 178 lb (80.7 kg)    Body mass index is 27.8 kg/m.  Performance status (ECOG): 1 - Symptomatic but completely ambulatory  PHYSICAL EXAM:   Physical Exam Vitals and nursing note reviewed. Exam conducted with a chaperone present.  Constitutional:      Appearance: Normal appearance.  Cardiovascular:     Rate and Rhythm: Normal rate and regular rhythm.     Pulses: Normal pulses.     Heart sounds: Normal heart sounds.  Pulmonary:     Effort: Pulmonary effort is normal.     Breath sounds: Normal breath sounds.  Abdominal:     Palpations: Abdomen is soft. There is no hepatomegaly, splenomegaly or mass.     Tenderness: There is no abdominal tenderness.  Musculoskeletal:     Right lower leg: No edema.     Left lower leg: No edema.  Lymphadenopathy:     Cervical: No cervical adenopathy.     Right cervical: No superficial, deep or posterior cervical adenopathy.    Left cervical: No superficial, deep or posterior cervical adenopathy.     Upper Body:     Right upper body: No supraclavicular or axillary adenopathy.     Left upper body: No supraclavicular or axillary adenopathy.  Neurological:     General: No focal deficit present.     Mental Status: He is alert and oriented to person, place, and time.  Psychiatric:        Mood and Affect: Mood normal.        Behavior: Behavior normal.     LABS:      Latest Ref Rng & Units 11/18/2023    1:17 PM 08/11/2023    2:29 PM 05/14/2023    2:34 PM  CBC  WBC 4.0 - 10.5 K/uL 6.6  6.0  6.5   Hemoglobin 13.0 -  17.0 g/dL 88.1  87.8  88.2   Hematocrit 39.0 - 52.0 % 34.1  36.4  35.0   Platelets 150 - 400 K/uL 223  245  248       Latest Ref Rng & Units 11/18/2023    1:17 PM 08/11/2023    2:29 PM 05/14/2023    2:34 PM  CMP  Glucose 70 - 99 mg/dL 861  893  872   BUN 8 - 23 mg/dL 21  26  21    Creatinine 0.61 - 1.24 mg/dL 8.69  8.71  8.73   Sodium 135 -  145 mmol/L 137  136  137   Potassium 3.5 - 5.1 mmol/L 3.7  4.1  3.7   Chloride 98 - 111 mmol/L 106  103  108   CO2 22 - 32 mmol/L 21  24  22    Calcium  8.9 - 10.3 mg/dL 9.5  9.8  9.5   Total Protein 6.5 - 8.1 g/dL 6.6  7.1  6.9   Total Bilirubin 0.0 - 1.2 mg/dL 0.8  0.6  0.7   Alkaline Phos 38 - 126 U/L 58  63  63   AST 15 - 41 U/L 32  33  28   ALT 0 - 44 U/L 29  37  29      Lab Results  Component Value Date   CEA1 1.5 11/18/2023   /  CEA  Date Value Ref Range Status  11/18/2023 1.5 0.0 - 4.7 ng/mL Final    Comment:    (NOTE)                             Nonsmokers          <3.9                             Smokers             <5.6 Roche [employer] Electrochemiluminescence Immunoassay (ECLIA) Values obtained with different assay methods or kits cannot be used interchangeably.  Results cannot be interpreted as absolute evidence of the presence or absence of malignant disease. Performed At: [facility] [address] [place], [place] [id number] Jennette Shorter MD Ey:[id number]    No results found for: PSA1 No results found for: RJW800 No results found for: CAN125  No results found for: STEPHANY RINGS, A1GS, A2GS, BETS, BETA2SER, GAMS, MSPIKE, SPEI Lab Results  Component Value Date   TIBC 395 08/11/2023   TIBC 385 05/14/2023   TIBC 371 02/04/2023   FERRITIN 317 08/11/2023   FERRITIN 313 05/14/2023   FERRITIN 404 (H) 02/04/2023   IRONPCTSAT 27 08/11/2023   IRONPCTSAT 18 05/14/2023   IRONPCTSAT 28 02/04/2023   No results found for: LDH   STUDIES:   No results found.

## 2023-11-25 NOTE — Patient Instructions (Addendum)
 [facility] at [facility] Discharge Instructions   You were seen and examined today by Dr. Rogers.  He reviewed the results of your lab work which are normal/stable.   We will see you back in 4 months. We will repeat a CT scan and lab work prior to this visit.    Return as scheduled.    Thank you for choosing [facility] at [facility] to provide your oncology and hematology care.  To afford each patient quality time with our provider, please arrive at least 15 minutes before your scheduled appointment time.   If you have a lab appointment with the Cancer Center please come in thru the Main Entrance and check in at the main information desk.  You need to re-schedule your appointment should you arrive 10 or more minutes late.  We strive to give you quality time with our providers, and arriving late affects you and other patients whose appointments are after yours.  Also, if you no show three or more times for appointments you may be dismissed from the clinic at the providers discretion.     Again, thank you for choosing [facility].  Our hope is that these requests will decrease the amount of time that you wait before being seen by our physicians.       _____________________________________________________________  Should you have questions after your visit to [facility], please contact our office at [phone] and follow the prompts.  Our office hours are 8:00 a.m. and 4:30 p.m. Monday - Friday.  Please note that voicemails left after 4:00 p.m. may not be returned until the following business day.  We are closed weekends and major holidays.  You do have access to a nurse 24-7, just call the main number to the clinic [phone] and do not press any options, hold on the line and a nurse will answer the phone.    For prescription refill requests, have your pharmacy contact our office and allow 72 hours.    Due to  Covid, you will need to wear a mask upon entering the hospital. If you do not have a mask, a mask will be given to you at the Main Entrance upon arrival. For doctor visits, patients may have 1 support person age 60 or older with them. For treatment visits, patients can not have anyone with them due to social distancing guidelines and our immunocompromised population.

## 2023-12-03 ENCOUNTER — Other Ambulatory Visit: Payer: Self-pay | Admitting: Hematology

## 2023-12-31 ENCOUNTER — Other Ambulatory Visit: Payer: Self-pay | Admitting: *Deleted

## 2023-12-31 MED ORDER — MELOXICAM 7.5 MG PO TABS: 7.5000 mg | ORAL_TABLET | Freq: Every day | ORAL | 0 refills | Status: DC

## 2024-01-27 ENCOUNTER — Other Ambulatory Visit: Payer: Self-pay | Admitting: Oncology

## 2024-02-24 ENCOUNTER — Other Ambulatory Visit: Payer: Self-pay | Admitting: Oncology

## 2024-02-26 ENCOUNTER — Inpatient Hospital Stay: Attending: Hematology

## 2024-02-26 ENCOUNTER — Encounter (HOSPITAL_COMMUNITY): Payer: Self-pay

## 2024-02-26 ENCOUNTER — Ambulatory Visit (HOSPITAL_COMMUNITY)
Admission: RE | Admit: 2024-02-26 | Discharge: 2024-02-26 | Disposition: A | Discharge status: Home / Self Care | Source: Ambulatory Visit | Attending: Hematology | Admitting: Hematology

## 2024-02-26 DIAGNOSIS — D631 Anemia in chronic kidney disease: Secondary | ICD-10-CM | POA: Insufficient documentation

## 2024-02-26 DIAGNOSIS — C19 Malignant neoplasm of rectosigmoid junction: Secondary | ICD-10-CM | POA: Insufficient documentation

## 2024-02-26 DIAGNOSIS — N189 Chronic kidney disease, unspecified: Secondary | ICD-10-CM | POA: Diagnosis not present

## 2024-02-26 DIAGNOSIS — D500 Iron deficiency anemia secondary to blood loss (chronic): Secondary | ICD-10-CM

## 2024-02-26 DIAGNOSIS — Z85038 Personal history of other malignant neoplasm of large intestine: Secondary | ICD-10-CM | POA: Insufficient documentation

## 2024-02-26 LAB — CBC WITH DIFFERENTIAL/PLATELET
Abs Immature Granulocytes: 0.02 K/uL (ref 0.00–0.07)
Basophils Absolute: 0.1 K/uL (ref 0.0–0.1)
Basophils Relative: 1 %
Eosinophils Absolute: 0.6 K/uL — ABNORMAL HIGH (ref 0.0–0.5)
Eosinophils Relative: 8 %
HCT: 35.4 % — ABNORMAL LOW (ref 39.0–52.0)
Hemoglobin: 12.1 g/dL — ABNORMAL LOW (ref 13.0–17.0)
Immature Granulocytes: 0 %
Lymphocytes Relative: 14 %
Lymphs Abs: 1 K/uL (ref 0.7–4.0)
MCH: 35.8 pg — ABNORMAL HIGH (ref 26.0–34.0)
MCHC: 34.2 g/dL (ref 30.0–36.0)
MCV: 104.7 fL — ABNORMAL HIGH (ref 80.0–100.0)
Monocytes Absolute: 0.5 K/uL (ref 0.1–1.0)
Monocytes Relative: 8 %
Neutro Abs: 4.9 K/uL (ref 1.7–7.7)
Neutrophils Relative %: 69 %
Platelets: 215 K/uL (ref 150–400)
RBC: 3.38 MIL/uL — ABNORMAL LOW (ref 4.22–5.81)
RDW: 12.3 % (ref 11.5–15.5)
WBC: 7 K/uL (ref 4.0–10.5)
nRBC: 0 % (ref 0.0–0.2)

## 2024-02-26 LAB — COMPREHENSIVE METABOLIC PANEL WITH GFR
ALT: 54 U/L — ABNORMAL HIGH (ref 0–44)
AST: 65 U/L — ABNORMAL HIGH (ref 15–41)
Albumin: 4.5 g/dL (ref 3.5–5.0)
Alkaline Phosphatase: 88 U/L (ref 38–126)
Anion gap: 11 (ref 5–15)
BUN: 22 mg/dL (ref 8–23)
CO2: 24 mmol/L (ref 22–32)
Calcium: 9.3 mg/dL (ref 8.9–10.3)
Chloride: 106 mmol/L (ref 98–111)
Creatinine, Ser: 1.33 mg/dL — ABNORMAL HIGH (ref 0.61–1.24)
GFR, Estimated: 59 mL/min — ABNORMAL LOW (ref >60)
Glucose, Bld: 106 mg/dL — ABNORMAL HIGH (ref 70–99)
Potassium: 3.9 mmol/L (ref 3.5–5.1)
Sodium: 141 mmol/L (ref 135–145)
Total Bilirubin: 0.3 mg/dL (ref 0.0–1.2)
Total Protein: 6.8 g/dL (ref 6.5–8.1)

## 2024-02-26 LAB — IRON AND TIBC
Iron: 85 ug/dL (ref 45–182)
Saturation Ratios: 25 % (ref 17.9–39.5)
TIBC: 340 ug/dL (ref 250–450)
UIBC: 256 ug/dL

## 2024-02-26 LAB — FERRITIN: Ferritin: 563 ng/mL — ABNORMAL HIGH (ref 24–336)

## 2024-02-26 MED ORDER — IOHEXOL 9 MG/ML PO SOLN
500.0000 mL | ORAL | Status: AC
  Administered 2024-02-26: 500 mL via ORAL

## 2024-02-26 MED ORDER — IOHEXOL 300 MG/ML  SOLN
100.0000 mL | Freq: Once | INTRAMUSCULAR | Status: AC | PRN
  Administered 2024-02-26: 100 mL via INTRAVENOUS

## 2024-02-27 ENCOUNTER — Other Ambulatory Visit: Payer: Self-pay

## 2024-02-27 LAB — CEA: CEA: 1.3 ng/mL (ref 0.0–4.7)

## 2024-02-27 MED ORDER — PREDNISONE 50 MG PO TABS: ORAL_TABLET | ORAL | 0 refills | Status: AC

## 2024-02-27 MED ORDER — DIPHENHYDRAMINE HCL 50 MG PO TABS: 50.0000 mg | ORAL_TABLET | Freq: Once | ORAL | 0 refills | Status: AC

## 2024-03-03 ENCOUNTER — Other Ambulatory Visit (HOSPITAL_BASED_OUTPATIENT_CLINIC_OR_DEPARTMENT_OTHER): Payer: Self-pay

## 2024-03-03 ENCOUNTER — Encounter: Payer: Self-pay | Admitting: Oncology

## 2024-03-04 ENCOUNTER — Inpatient Hospital Stay: Attending: Hematology | Admitting: Oncology

## 2024-03-04 DIAGNOSIS — D509 Iron deficiency anemia, unspecified: Secondary | ICD-10-CM | POA: Insufficient documentation

## 2024-03-04 DIAGNOSIS — Z85038 Personal history of other malignant neoplasm of large intestine: Secondary | ICD-10-CM | POA: Insufficient documentation

## 2024-03-04 DIAGNOSIS — C19 Malignant neoplasm of rectosigmoid junction: Secondary | ICD-10-CM | POA: Diagnosis not present

## 2024-03-04 DIAGNOSIS — Z933 Colostomy status: Secondary | ICD-10-CM | POA: Diagnosis present

## 2024-03-04 DIAGNOSIS — D500 Iron deficiency anemia secondary to blood loss (chronic): Secondary | ICD-10-CM

## 2024-03-04 NOTE — Progress Notes (Signed)
 [facility] 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  Total examined lymph nodes 35.  MMR-preserved. - CT CAP on 07/31/2021 with no evidence of metastatic disease. - Xeloda  in the adjuvant setting from 09/05/2021 through 03/25/2022    Social/family history: - He lives by himself.  He is a retired personnel officer.  No exposure to asbestos.  Non-smoker. - Father had colon cancer in his 14s.  Maternal uncle had colon cancer.  Plan: T4N0 sigmoid colon cancer: - He denies any bowel consistency changes.  He has noticed blood in the bag once, most likely from irritation around the ostomy site. - I reviewed labs from 02/26/2024: LFTs are slightly elevated AST 65 ALT 54.  CEA was normal at 1.3.  Hemoglobin stable low at 12.1 with normal platelet count.  MCV elevated at 104.7. -Patient was post to have CT scan drawn prior to his visit but he was unable to get it rescheduled after he was ill.  Will get this scheduled ASAP given elevated LFTs.  Will call with results. -If CTAP is WNL, would recommend follow-up in 6 months with labs and see NP.  At that time, he can transition to annual imaging but every 1-month labs and see NP.    2.  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  Last Allegra was in April 2024.  Last ferritin was 563 and saturation 25.  Hemoglobin is stable between 11 and 13.  3.  CKD: - CKD  stable at 1.33.  He had missed previous appointments with Dr. Rachele.  If there is any worsening, make a referral to Dr. Rachele.    Orders Placed This Encounter  Procedures   CT ABDOMEN PELVIS W CONTRAST    Standing Status:   Future    Expected Date:   03/04/2024    Expiration Date:   03/04/2025    If indicated for the ordered procedure, I authorize the administration of contrast media per Radiology protocol:   Yes    Does the patient have a contrast media/X-ray dye allergy?:   No    Preferred imaging location?:   [facility]    If indicated for the ordered procedure, I authorize the administration of oral contrast media per Radiology protocol:   Yes   CBC with Differential    Standing Status:   Future    Expected Date:   06/05/2024    Expiration Date:   10/05/2024   Comprehensive metabolic panel    Standing Status:   Future    Expected Date:   06/05/2024    Expiration Date:   10/05/2024   CEA    Standing Status:   Future    Expected Date:   06/05/2024    Expiration Date:   10/05/2024   Iron and TIBC ([facility] ONLY)    Standing Status:   Future    Expected Date:   06/05/2024    Expiration Date:   10/05/2024   Ferritin  Standing Status:   Future    Expected Date:   06/05/2024    Expiration Date:   10/05/2024       Delon FORBES Hope, NP   11/6/20253:18 PM  CHIEF COMPLAINT:   Diagnosis: rectosigmoid cancer    Cancer Staging  Cancer of rectosigmoid (colon) [facility]) Staging form: Colon and Rectum, AJCC 8th Edition - Clinical stage from 07/31/2021: Stage IIB (cT4a(2), cN0, cM0) - Signed by Cornelius Wanda DEL, MD on 09/01/2021  Cecal cancer [facility]) Staging form: Colon and Rectum, AJCC 8th Edition - Clinical stage from 07/31/2021: Stage IIA (cT3(2), cN0, cM0) - Signed by Cornelius Wanda DEL, MD on 09/01/2021    Prior Therapy: none  Current Therapy:  Xeloda    HISTORY OF PRESENT ILLNESS:   Oncology History  Cecal cancer (HCC)  07/31/2021 Cancer Staging   Staging  form: Colon and Rectum, AJCC 8th Edition - Clinical stage from 07/31/2021: Stage IIA (cT3(2), cN0, cM0) - Signed by Cornelius Wanda DEL, MD on 09/01/2021 Histopathologic type: Adenocarcinoma, NOS Stage prefix: Initial diagnosis Total positive nodes: 0 Total nodes examined: 35 Histologic grade (G): G2 Histologic grading system: 4 grade system Laterality: Right Bilateral cancer: Yes Tumor size (mm): 74 Multiple tumors: Yes Number of tumors: 2 Lymph-vascular invasion (LVI): LVI not present (absent)/not identified Diagnostic confirmation: Positive histology Specimen type: Excision Staged by: Managing physician Tumor deposits (TD): Absent Perineural invasion (PNI): Absent Microsatellite instability (MSI): Stable KRAS mutation: Not assessed NRAS mutation: Not assessed BRAF mutation: Not assessed Stage used in treatment planning: Yes National guidelines used in treatment planning: Yes Type of national guideline used in treatment planning: NCCN   08/28/2021 Initial Diagnosis   Cecal cancer (HCC)   Cancer of rectosigmoid (colon) (HCC)  07/31/2021 Initial Diagnosis   Cancer of rectosigmoid (colon) (HCC)   07/31/2021 Cancer Staging   Staging form: Colon and Rectum, AJCC 8th Edition - Clinical stage from 07/31/2021: Stage IIB (cT4a(2), cN0, cM0) - Signed by Cornelius Wanda DEL, MD on 09/01/2021 Histopathologic type: Adenocarcinoma, NOS Stage prefix: Initial diagnosis Total positive nodes: 0 Total nodes examined: 35 Histologic grade (G): G2 Histologic grading system: 4 grade system Laterality: Left Bilateral cancer: Yes Tumor size (mm): 83 Multiple tumors: Yes Number of tumors: 2 Lymph-vascular invasion (LVI): LVI not present (absent)/not identified Diagnostic confirmation: Positive histology Specimen type: Excision Staged by: Managing physician Tumor deposits (TD): Absent Carcinoembryonic antigen (CEA) (ng/mL): 1.6 Perineural invasion (PNI): Absent Microsatellite instability (MSI):  Stable KRAS mutation: Not assessed NRAS mutation: Not assessed BRAF mutation: Not assessed Stage used in treatment planning: Yes National guidelines used in treatment planning: Yes Type of national guideline used in treatment planning: NCCN   09/12/2021 - 09/12/2021 Chemotherapy   Patient is on Treatment Plan : COLORECTAL Capecitabine  q21d        INTERVAL HISTORY:   Ved is a 68 y.o. male presenting to clinic today for follow up of rectosigmoid cancer.   Today, he reports he is doing well.  Appetite and energy level are 75%.  He has chronic right knee pain and rates it a 5 out of 10.  Has dizziness when he looks up to the sky that resolves when he looks straight ahead.  This only happens periodically.  He has not mentioned it to his PCP yet.  Does have history of vertigo and hypertension.  Reports he was supposed to have his CT scan completed last week that he was sick and needs to reschedule.  Reports he is not taking iron supplements but rather eating  iron rich diet.  He does take B12 supplements.  Reports his ostomy is doing well.  He use a nonstick Medipore tape that helps his skin from breaking out from the ostomy adhesion.  He states he is drinking an adequate amount of fluids.  PAST MEDICAL HISTORY:   Past Medical History: Past Medical History:  Diagnosis Date   Arthritis    Asthma    Vertigo     Surgical History: Past Surgical History:  Procedure Laterality Date   BIOPSY  07/31/2021   Procedure: BIOPSY;  Surgeon: Cindie Carlin POUR, DO;  Location: [facility];  Service: Endoscopy;;   CATARACT EXTRACTION Left    COLON RESECTION N/A 08/02/2021   Procedure: En bloc Resection of Colon and Small bowel;  Surgeon: Kallie Manuelita BROCKS, MD;  Location: [facility];  Service: General;  Laterality: N/A;   COLOSTOMY Right 08/02/2021   Procedure: ILEOSTOMY;  Surgeon: Kallie Manuelita BROCKS, MD;  Location: [facility];  Service: General;  Laterality: Right;   FLEXIBLE SIGMOIDOSCOPY  07/31/2021    Procedure: FLEXIBLE SIGMOIDOSCOPY;  Surgeon: Cindie Carlin POUR, DO;  Location: [facility];  Service: Endoscopy;;   FLEXIBLE SIGMOIDOSCOPY N/A 12/16/2022   Procedure: FLEXIBLE SIGMOIDOSCOPY;  Surgeon: Cindie Carlin POUR, DO;  Location: [facility];  Service: Endoscopy;  Laterality: N/A;  945AM, ASA 3   SUBMUCOSAL TATTOO INJECTION  07/31/2021   Procedure: SUBMUCOSAL TATTOO INJECTION;  Surgeon: Cindie Carlin POUR, DO;  Location: [facility];  Service: Endoscopy;;   SUBTOTAL COLECTOMY     end ileostomy; sigmoid and cecal cancers    Social History: Social History   Socioeconomic History   Marital status: Single    Spouse name: Not on file   Number of children: 0   Years of education: 12+1   Highest education level: Some college, no degree  Occupational History   Not on file  Tobacco Use   Smoking status: Never   Smokeless tobacco: Never  Vaping Use   Vaping status: Never Used  Substance and Sexual Activity   Alcohol use: Not Currently    Alcohol/week: 4.0 standard drinks of alcohol    Types: 4 Cans of beer per week    Comment: every day-none in 6 months or more   Drug use: Never   Sexual activity: Not Currently  Other Topics Concern   Not on file  Social History Narrative   Not on file   Social Drivers of Health   Financial Resource Strain: Not on file  Food Insecurity: Not on file  Transportation Needs: Not on file  Physical Activity: Not on file  Stress: No Stress Concern Present (08/31/2021)   [employer] of Occupational Health - Occupational Stress Questionnaire    Feeling of Stress : Not at all  Social Connections: Not on file  Intimate Partner Violence: Not At Risk (08/31/2021)   Humiliation, Afraid, Rape, and Kick questionnaire    Fear of Current or Ex-Partner: No    Emotionally Abused: No    Physically Abused: No    Sexually Abused: No    Family History: Family History  Problem Relation Age of Onset   Hypertension Mother    Diabetes Mother     Parkinson's disease Father    Dementia Father     Current Medications:  Current Outpatient Medications:    cetirizine (ZYRTEC) 10 MG tablet, Take 10 mg by mouth daily., Disp: , Rfl:    Loperamide  HCl (IMODIUM  PO), Take 1 tablet by mouth 4 (four) times  daily. (Patient taking differently: Take 1 tablet by mouth 4 (four) times daily. 4-8 times daily), Disp: , Rfl:    meloxicam  (MOBIC ) 7.5 MG tablet, Take 1 tablet by mouth once daily, Disp: 30 tablet, Rfl: 0   Omega-3 Fatty Acids (FISH OIL OMEGA-3 PO), Take by mouth., Disp: , Rfl:    psyllium (HYDROCIL/METAMUCIL) 95 % PACK, Take 1 packet by mouth daily., Disp: 240 each, Rfl: 2   diphenhydrAMINE (BENADRYL) 25 mg capsule, Take 25 mg by mouth as needed. (Patient not taking: Reported on 03/04/2024), Disp: , Rfl:    diphenhydrAMINE (BENADRYL) 50 MG tablet, Take 1 tablet (50 mg total) by mouth once for 1 dose. Take one hour before CT (Patient not taking: Reported on 03/04/2024), Disp: 1 tablet, Rfl: 0   predniSONE (DELTASONE) 50 MG tablet, Take 13 hours, 7 hours and 1 hour prior to CT (Patient not taking: Reported on 03/04/2024), Disp: 3 tablet, Rfl: 0   Allergies: Allergies  Allergen Reactions   Apple Anaphylaxis    Other reaction(s): Unknown   Apple Juice Anaphylaxis    Throat swelling    Iohexol  Hives    Patient states hives after contrast injection     REVIEW OF SYSTEMS:   Review of Systems  Constitutional:  Positive for fatigue.  Musculoskeletal:  Positive for arthralgias.  Skin:  Positive for rash.     VITALS:   Blood pressure 132/68, pulse 93, temperature 99.4 F (37.4 C), temperature source Tympanic, resp. rate 19, height 5' 7.5 (1.715 m), weight 182 lb (82.6 kg), SpO2 97%.  Wt Readings from Last 3 Encounters:  03/04/24 182 lb (82.6 kg)  11/25/23 177 lb 7.5 oz (80.5 kg)  08/19/23 177 lb 7.5 oz (80.5 kg)    Body mass index is 28.08 kg/m.  Performance status (ECOG): 1 - Symptomatic but completely ambulatory  PHYSICAL EXAM:    Physical Exam Constitutional:      Appearance: Normal appearance.  Cardiovascular:     Rate and Rhythm: Normal rate and regular rhythm.  Pulmonary:     Effort: Pulmonary effort is normal.     Breath sounds: Normal breath sounds.  Abdominal:     General: Bowel sounds are normal.     Palpations: Abdomen is soft.  Musculoskeletal:        General: No swelling. Normal range of motion.  Neurological:     Mental Status: He is alert and oriented to person, place, and time. Mental status is at baseline.     LABS:      Latest Ref Rng & Units 02/26/2024    2:21 PM 11/18/2023    1:17 PM 08/11/2023    2:29 PM  CBC  WBC 4.0 - 10.5 K/uL 7.0  6.6  6.0   Hemoglobin 13.0 - 17.0 g/dL 87.8  88.1  87.8   Hematocrit 39.0 - 52.0 % 35.4  34.1  36.4   Platelets 150 - 400 K/uL 215  223  245       Latest Ref Rng & Units 02/26/2024    2:21 PM 11/18/2023    1:17 PM 08/11/2023    2:29 PM  CMP  Glucose 70 - 99 mg/dL 893  861  893   BUN 8 - 23 mg/dL 22  21  26    Creatinine 0.61 - 1.24 mg/dL 8.66  8.69  8.71   Sodium 135 - 145 mmol/L 141  137  136   Potassium 3.5 - 5.1 mmol/L 3.9  3.7  4.1   Chloride 98 -  111 mmol/L 106  106  103   CO2 22 - 32 mmol/L 24  21  24    Calcium  8.9 - 10.3 mg/dL 9.3  9.5  9.8   Total Protein 6.5 - 8.1 g/dL 6.8  6.6  7.1   Total Bilirubin 0.0 - 1.2 mg/dL 0.3  0.8  0.6   Alkaline Phos 38 - 126 U/L 88  58  63   AST 15 - 41 U/L 65  32  33   ALT 0 - 44 U/L 54  29  37      Lab Results  Component Value Date   CEA1 1.3 02/26/2024   /  CEA  Date Value Ref Range Status  02/26/2024 1.3 0.0 - 4.7 ng/mL Final    Comment:    (NOTE)                             Nonsmokers          <3.9                             Smokers             <5.6 [employer] Electrochemiluminescence Immunoassay (ECLIA) Values obtained with different assay methods or kits cannot be used interchangeably.  Results cannot be interpreted as absolute evidence of the presence or absence of  malignant disease. Performed At: [facility] [address] [place], [place] [id number] Jennette Shorter MD Ey:[id number]    No results found for: PSA1 No results found for: CAN199 No results found for: CAN125  No results found for: STEPHANY CARLOTA BENSON MARKEL EARLA JOANNIE DOC, MSPIKE, SPEI Lab Results  Component Value Date   TIBC 340 02/26/2024   TIBC 395 08/11/2023   TIBC 385 05/14/2023   FERRITIN 563 (H) 02/26/2024   FERRITIN 317 08/11/2023   FERRITIN 313 05/14/2023   IRONPCTSAT 25 02/26/2024   IRONPCTSAT 27 08/11/2023   IRONPCTSAT 18 05/14/2023   No results found for: LDH   STUDIES:   No results found.

## 2024-03-05 ENCOUNTER — Ambulatory Visit (HOSPITAL_COMMUNITY)
Admission: RE | Admit: 2024-03-05 | Discharge: 2024-03-05 | Disposition: A | Discharge status: Home / Self Care | Source: Ambulatory Visit | Attending: Oncology | Admitting: Oncology

## 2024-03-05 DIAGNOSIS — C19 Malignant neoplasm of rectosigmoid junction: Secondary | ICD-10-CM | POA: Diagnosis present

## 2024-03-05 MED ORDER — IOHEXOL 300 MG/ML  SOLN
100.0000 mL | Freq: Once | INTRAMUSCULAR | Status: AC | PRN
  Administered 2024-03-05: 100 mL via INTRAVENOUS

## 2024-03-06 ENCOUNTER — Ambulatory Visit: Payer: Self-pay | Admitting: Oncology

## 2024-03-06 NOTE — Progress Notes (Signed)
 We let him know that his scan looked great.  No concerns for metastatic disease.    Thank you Delon Hope, NP 03/06/2024 6:35 PM

## 2024-03-08 NOTE — Progress Notes (Signed)
 Patient aware and verbalized understanding.

## 2024-03-23 ENCOUNTER — Other Ambulatory Visit: Payer: Self-pay | Admitting: Oncology

## 2024-04-23 ENCOUNTER — Other Ambulatory Visit: Payer: Self-pay | Admitting: Oncology

## 2024-04-26 ENCOUNTER — Encounter: Payer: Self-pay | Admitting: Oncology

## 2024-05-23 ENCOUNTER — Other Ambulatory Visit: Payer: Self-pay | Admitting: Oncology

## 2024-05-24 ENCOUNTER — Encounter: Payer: Self-pay | Admitting: Oncology

## 2024-09-02 ENCOUNTER — Inpatient Hospital Stay

## 2024-09-09 ENCOUNTER — Inpatient Hospital Stay: Admitting: Oncology
